# Patient Record
Sex: Female | Born: 1935 | Race: White | Hispanic: No | Smoking: Former smoker
Health system: Southern US, Community
[De-identification: ages and names within clinical notes are randomized; demographics above are authoritative.]

## PROBLEM LIST (undated history)

## (undated) DIAGNOSIS — C3490 Malignant neoplasm of unspecified part of unspecified bronchus or lung: Secondary | ICD-10-CM

## (undated) DIAGNOSIS — M810 Age-related osteoporosis without current pathological fracture: Secondary | ICD-10-CM

## (undated) DIAGNOSIS — F0390 Unspecified dementia without behavioral disturbance: Secondary | ICD-10-CM

## (undated) DIAGNOSIS — A159 Respiratory tuberculosis unspecified: Secondary | ICD-10-CM

## (undated) DIAGNOSIS — Z8669 Personal history of other diseases of the nervous system and sense organs: Secondary | ICD-10-CM

## (undated) DIAGNOSIS — Z8611 Personal history of tuberculosis: Secondary | ICD-10-CM

## (undated) DIAGNOSIS — G20A1 Parkinson's disease without dyskinesia, without mention of fluctuations: Secondary | ICD-10-CM

## (undated) DIAGNOSIS — C801 Malignant (primary) neoplasm, unspecified: Secondary | ICD-10-CM

## (undated) DIAGNOSIS — J189 Pneumonia, unspecified organism: Secondary | ICD-10-CM

## (undated) DIAGNOSIS — E119 Type 2 diabetes mellitus without complications: Secondary | ICD-10-CM

## (undated) DIAGNOSIS — E079 Disorder of thyroid, unspecified: Secondary | ICD-10-CM

## (undated) DIAGNOSIS — E785 Hyperlipidemia, unspecified: Secondary | ICD-10-CM

## (undated) DIAGNOSIS — G20 Parkinson's disease: Secondary | ICD-10-CM

## (undated) DIAGNOSIS — R42 Dizziness and giddiness: Secondary | ICD-10-CM

## (undated) DIAGNOSIS — E039 Hypothyroidism, unspecified: Secondary | ICD-10-CM

## (undated) DIAGNOSIS — K219 Gastro-esophageal reflux disease without esophagitis: Secondary | ICD-10-CM

## (undated) DIAGNOSIS — I10 Essential (primary) hypertension: Secondary | ICD-10-CM

## (undated) HISTORY — PX: CATARACT EXTRACTION: SUR2

## (undated) HISTORY — PX: THYROIDECTOMY: SHX17

## (undated) HISTORY — PX: ABDOMINAL HYSTERECTOMY: SHX81

## (undated) HISTORY — PX: CHOLECYSTECTOMY: SHX55

## (undated) HISTORY — DX: Age-related osteoporosis without current pathological fracture: M81.0

## (undated) HISTORY — DX: Disorder of thyroid, unspecified: E07.9

## (undated) HISTORY — DX: Essential (primary) hypertension: I10

## (undated) HISTORY — DX: Malignant neoplasm of unspecified part of unspecified bronchus or lung: C34.90

## (undated) HISTORY — DX: Respiratory tuberculosis unspecified: A15.9

## (undated) HISTORY — DX: Hypothyroidism, unspecified: E03.9

## (undated) HISTORY — DX: Hyperlipidemia, unspecified: E78.5

---

## 1963-03-05 DIAGNOSIS — Z8611 Personal history of tuberculosis: Secondary | ICD-10-CM

## 1963-03-05 HISTORY — DX: Personal history of tuberculosis: Z86.11

## 1992-03-04 HISTORY — PX: CHOLECYSTECTOMY: SHX55

## 2000-08-04 ENCOUNTER — Encounter: Payer: Self-pay | Admitting: Family Medicine

## 2000-08-04 ENCOUNTER — Ambulatory Visit (HOSPITAL_COMMUNITY): Admission: RE | Admit: 2000-08-04 | Discharge: 2000-08-04 | Payer: Self-pay | Admitting: Family Medicine

## 2000-12-24 ENCOUNTER — Ambulatory Visit (HOSPITAL_COMMUNITY): Admission: RE | Admit: 2000-12-24 | Discharge: 2000-12-24 | Payer: Self-pay | Admitting: Family Medicine

## 2000-12-24 ENCOUNTER — Encounter: Payer: Self-pay | Admitting: Family Medicine

## 2001-01-21 ENCOUNTER — Ambulatory Visit (HOSPITAL_COMMUNITY): Admission: RE | Admit: 2001-01-21 | Discharge: 2001-01-21 | Payer: Self-pay | Admitting: General Surgery

## 2001-05-04 ENCOUNTER — Ambulatory Visit (HOSPITAL_COMMUNITY): Admission: RE | Admit: 2001-05-04 | Discharge: 2001-05-04 | Payer: Self-pay | Admitting: Internal Medicine

## 2001-05-04 ENCOUNTER — Encounter: Payer: Self-pay | Admitting: Internal Medicine

## 2001-05-14 ENCOUNTER — Ambulatory Visit (HOSPITAL_COMMUNITY): Admission: RE | Admit: 2001-05-14 | Discharge: 2001-05-14 | Payer: Self-pay | Admitting: Internal Medicine

## 2001-05-14 ENCOUNTER — Encounter: Payer: Self-pay | Admitting: Internal Medicine

## 2001-08-05 ENCOUNTER — Encounter: Payer: Self-pay | Admitting: Family Medicine

## 2001-08-05 ENCOUNTER — Ambulatory Visit (HOSPITAL_COMMUNITY): Admission: RE | Admit: 2001-08-05 | Discharge: 2001-08-05 | Payer: Self-pay | Admitting: Family Medicine

## 2002-08-06 ENCOUNTER — Encounter: Payer: Self-pay | Admitting: Family Medicine

## 2002-08-06 ENCOUNTER — Ambulatory Visit (HOSPITAL_COMMUNITY): Admission: RE | Admit: 2002-08-06 | Discharge: 2002-08-06 | Payer: Self-pay | Admitting: Family Medicine

## 2003-03-05 HISTORY — PX: ABDOMINAL HYSTERECTOMY: SHX81

## 2003-05-06 ENCOUNTER — Ambulatory Visit (HOSPITAL_COMMUNITY): Admission: RE | Admit: 2003-05-06 | Discharge: 2003-05-06 | Payer: Self-pay | Admitting: Family Medicine

## 2003-08-08 ENCOUNTER — Ambulatory Visit (HOSPITAL_COMMUNITY): Admission: RE | Admit: 2003-08-08 | Discharge: 2003-08-08 | Payer: Self-pay | Admitting: Family Medicine

## 2003-08-15 ENCOUNTER — Other Ambulatory Visit: Admission: RE | Admit: 2003-08-15 | Discharge: 2003-08-15 | Payer: Self-pay | Admitting: Obstetrics and Gynecology

## 2003-09-08 ENCOUNTER — Ambulatory Visit (HOSPITAL_COMMUNITY): Admission: RE | Admit: 2003-09-08 | Discharge: 2003-09-08 | Payer: Self-pay | Admitting: Obstetrics and Gynecology

## 2003-09-14 ENCOUNTER — Ambulatory Visit: Admission: RE | Admit: 2003-09-14 | Discharge: 2003-09-14 | Payer: Self-pay | Admitting: Gynecology

## 2003-10-04 ENCOUNTER — Ambulatory Visit: Admission: RE | Admit: 2003-10-04 | Discharge: 2003-10-04 | Payer: Self-pay | Admitting: Gynecology

## 2003-11-01 ENCOUNTER — Encounter (INDEPENDENT_AMBULATORY_CARE_PROVIDER_SITE_OTHER): Payer: Self-pay | Admitting: *Deleted

## 2003-11-01 ENCOUNTER — Inpatient Hospital Stay (HOSPITAL_COMMUNITY): Admission: RE | Admit: 2003-11-01 | Discharge: 2003-11-03 | Payer: Self-pay | Admitting: Obstetrics & Gynecology

## 2003-11-22 ENCOUNTER — Ambulatory Visit: Admission: RE | Admit: 2003-11-22 | Discharge: 2003-11-22 | Payer: Self-pay | Admitting: Gynecology

## 2003-11-25 ENCOUNTER — Ambulatory Visit: Admission: RE | Admit: 2003-11-25 | Discharge: 2004-02-23 | Payer: Self-pay | Admitting: Radiation Oncology

## 2003-12-20 ENCOUNTER — Ambulatory Visit: Admission: RE | Admit: 2003-12-20 | Discharge: 2003-12-20 | Payer: Self-pay | Admitting: Gynecology

## 2004-05-15 ENCOUNTER — Other Ambulatory Visit: Admission: RE | Admit: 2004-05-15 | Discharge: 2004-05-15 | Payer: Self-pay | Admitting: Gynecology

## 2004-05-15 ENCOUNTER — Ambulatory Visit: Admission: RE | Admit: 2004-05-15 | Discharge: 2004-05-15 | Payer: Self-pay | Admitting: Gynecologic Oncology

## 2004-05-15 ENCOUNTER — Encounter (INDEPENDENT_AMBULATORY_CARE_PROVIDER_SITE_OTHER): Payer: Self-pay | Admitting: *Deleted

## 2004-08-10 ENCOUNTER — Ambulatory Visit (HOSPITAL_COMMUNITY): Admission: RE | Admit: 2004-08-10 | Discharge: 2004-08-10 | Payer: Self-pay | Admitting: Family Medicine

## 2004-09-25 ENCOUNTER — Ambulatory Visit (HOSPITAL_COMMUNITY): Admission: RE | Admit: 2004-09-25 | Discharge: 2004-09-25 | Payer: Self-pay | Admitting: Family Medicine

## 2004-11-09 ENCOUNTER — Ambulatory Visit (HOSPITAL_COMMUNITY): Admission: RE | Admit: 2004-11-09 | Discharge: 2004-11-09 | Payer: Self-pay | Admitting: Family Medicine

## 2004-12-12 ENCOUNTER — Inpatient Hospital Stay (HOSPITAL_COMMUNITY): Admission: AD | Admit: 2004-12-12 | Discharge: 2004-12-16 | Payer: Self-pay | Admitting: Family Medicine

## 2005-01-04 ENCOUNTER — Inpatient Hospital Stay (HOSPITAL_COMMUNITY): Admission: RE | Admit: 2005-01-04 | Discharge: 2005-01-07 | Payer: Self-pay | Admitting: General Surgery

## 2005-01-04 ENCOUNTER — Encounter (INDEPENDENT_AMBULATORY_CARE_PROVIDER_SITE_OTHER): Payer: Self-pay | Admitting: General Surgery

## 2005-02-01 ENCOUNTER — Encounter (INDEPENDENT_AMBULATORY_CARE_PROVIDER_SITE_OTHER): Payer: Self-pay | Admitting: *Deleted

## 2005-02-01 ENCOUNTER — Other Ambulatory Visit: Admission: RE | Admit: 2005-02-01 | Discharge: 2005-02-01 | Payer: Self-pay | Admitting: Gynecology

## 2005-02-01 ENCOUNTER — Ambulatory Visit: Admission: RE | Admit: 2005-02-01 | Discharge: 2005-02-01 | Payer: Self-pay | Admitting: Gynecology

## 2005-02-19 ENCOUNTER — Inpatient Hospital Stay: Payer: Self-pay | Admitting: Endocrinology

## 2005-03-04 HISTORY — PX: THYROIDECTOMY: SHX17

## 2005-04-22 ENCOUNTER — Ambulatory Visit (HOSPITAL_COMMUNITY): Admission: RE | Admit: 2005-04-22 | Discharge: 2005-04-22 | Payer: Self-pay | Admitting: Family Medicine

## 2005-06-01 ENCOUNTER — Inpatient Hospital Stay (HOSPITAL_COMMUNITY): Admission: EM | Admit: 2005-06-01 | Discharge: 2005-06-04 | Payer: Self-pay | Admitting: Emergency Medicine

## 2005-06-17 ENCOUNTER — Ambulatory Visit (HOSPITAL_COMMUNITY): Admission: RE | Admit: 2005-06-17 | Discharge: 2005-06-17 | Payer: Self-pay | Admitting: Family Medicine

## 2005-07-05 ENCOUNTER — Encounter (HOSPITAL_COMMUNITY): Admission: RE | Admit: 2005-07-05 | Discharge: 2005-08-04 | Payer: Self-pay | Admitting: Family Medicine

## 2005-07-10 ENCOUNTER — Ambulatory Visit (HOSPITAL_COMMUNITY): Admission: RE | Admit: 2005-07-10 | Discharge: 2005-07-10 | Payer: Self-pay | Admitting: Family Medicine

## 2005-08-12 ENCOUNTER — Ambulatory Visit (HOSPITAL_COMMUNITY): Admission: RE | Admit: 2005-08-12 | Discharge: 2005-08-12 | Payer: Self-pay | Admitting: Family Medicine

## 2005-09-20 ENCOUNTER — Inpatient Hospital Stay (HOSPITAL_COMMUNITY): Admission: EM | Admit: 2005-09-20 | Discharge: 2005-09-24 | Payer: Self-pay | Admitting: Emergency Medicine

## 2005-10-16 ENCOUNTER — Ambulatory Visit: Admission: RE | Admit: 2005-10-16 | Discharge: 2005-10-16 | Payer: Self-pay | Admitting: Gynecologic Oncology

## 2005-10-16 ENCOUNTER — Other Ambulatory Visit: Admission: RE | Admit: 2005-10-16 | Discharge: 2005-10-16 | Payer: Self-pay | Admitting: Gynecologic Oncology

## 2005-10-16 ENCOUNTER — Encounter (INDEPENDENT_AMBULATORY_CARE_PROVIDER_SITE_OTHER): Payer: Self-pay | Admitting: Specialist

## 2005-11-22 ENCOUNTER — Ambulatory Visit (HOSPITAL_COMMUNITY): Admission: RE | Admit: 2005-11-22 | Discharge: 2005-11-22 | Payer: Self-pay | Admitting: Radiation Oncology

## 2006-02-13 ENCOUNTER — Inpatient Hospital Stay (HOSPITAL_COMMUNITY): Admission: EM | Admit: 2006-02-13 | Discharge: 2006-02-17 | Payer: Self-pay | Admitting: Emergency Medicine

## 2006-02-13 ENCOUNTER — Ambulatory Visit: Payer: Self-pay | Admitting: Gastroenterology

## 2006-03-04 DIAGNOSIS — E079 Disorder of thyroid, unspecified: Secondary | ICD-10-CM

## 2006-03-04 HISTORY — PX: FLEXIBLE SIGMOIDOSCOPY: SHX1649

## 2006-03-04 HISTORY — DX: Disorder of thyroid, unspecified: E07.9

## 2006-04-09 ENCOUNTER — Ambulatory Visit: Payer: Self-pay | Admitting: Gastroenterology

## 2006-04-12 ENCOUNTER — Inpatient Hospital Stay (HOSPITAL_COMMUNITY): Admission: EM | Admit: 2006-04-12 | Discharge: 2006-04-15 | Payer: Self-pay | Admitting: Emergency Medicine

## 2006-04-23 ENCOUNTER — Ambulatory Visit (HOSPITAL_COMMUNITY): Admission: RE | Admit: 2006-04-23 | Discharge: 2006-04-23 | Payer: Self-pay | Admitting: Gastroenterology

## 2006-04-23 ENCOUNTER — Ambulatory Visit: Payer: Self-pay | Admitting: Gastroenterology

## 2006-05-14 ENCOUNTER — Emergency Department (HOSPITAL_COMMUNITY): Admission: EM | Admit: 2006-05-14 | Discharge: 2006-05-14 | Payer: Self-pay | Admitting: Emergency Medicine

## 2006-08-18 ENCOUNTER — Ambulatory Visit (HOSPITAL_COMMUNITY): Admission: RE | Admit: 2006-08-18 | Discharge: 2006-08-18 | Payer: Self-pay | Admitting: Internal Medicine

## 2007-03-17 ENCOUNTER — Inpatient Hospital Stay: Payer: Self-pay | Admitting: Endocrinology

## 2007-08-19 ENCOUNTER — Ambulatory Visit (HOSPITAL_COMMUNITY): Admission: RE | Admit: 2007-08-19 | Discharge: 2007-08-19 | Payer: Self-pay | Admitting: Internal Medicine

## 2007-09-16 ENCOUNTER — Encounter: Payer: Self-pay | Admitting: Gynecology

## 2007-09-16 ENCOUNTER — Other Ambulatory Visit: Admission: RE | Admit: 2007-09-16 | Discharge: 2007-09-16 | Payer: Self-pay | Admitting: Gynecology

## 2007-09-16 ENCOUNTER — Ambulatory Visit: Admission: RE | Admit: 2007-09-16 | Discharge: 2007-09-16 | Payer: Self-pay | Admitting: Gynecology

## 2007-10-22 ENCOUNTER — Encounter (INDEPENDENT_AMBULATORY_CARE_PROVIDER_SITE_OTHER): Payer: Self-pay | Admitting: Family Medicine

## 2007-11-30 ENCOUNTER — Encounter (HOSPITAL_COMMUNITY): Admission: RE | Admit: 2007-11-30 | Discharge: 2007-12-30 | Payer: Self-pay | Admitting: Endocrinology

## 2007-12-09 ENCOUNTER — Ambulatory Visit: Payer: Self-pay | Admitting: Family Medicine

## 2007-12-09 DIAGNOSIS — Z8541 Personal history of malignant neoplasm of cervix uteri: Secondary | ICD-10-CM

## 2007-12-09 DIAGNOSIS — E785 Hyperlipidemia, unspecified: Secondary | ICD-10-CM | POA: Insufficient documentation

## 2007-12-09 DIAGNOSIS — R809 Proteinuria, unspecified: Secondary | ICD-10-CM | POA: Insufficient documentation

## 2007-12-09 DIAGNOSIS — H269 Unspecified cataract: Secondary | ICD-10-CM

## 2007-12-09 DIAGNOSIS — Z85850 Personal history of malignant neoplasm of thyroid: Secondary | ICD-10-CM

## 2007-12-09 DIAGNOSIS — N390 Urinary tract infection, site not specified: Secondary | ICD-10-CM

## 2007-12-09 DIAGNOSIS — Z8611 Personal history of tuberculosis: Secondary | ICD-10-CM

## 2007-12-09 DIAGNOSIS — E119 Type 2 diabetes mellitus without complications: Secondary | ICD-10-CM

## 2007-12-09 DIAGNOSIS — I10 Essential (primary) hypertension: Secondary | ICD-10-CM

## 2007-12-09 LAB — CONVERTED CEMR LAB
Glucose, Urine, Semiquant: NEGATIVE
Hgb A1c MFr Bld: 6.8 %
LDL Goal: 100 mg/dL
Specific Gravity, Urine: 1.015
Urobilinogen, UA: 0.2

## 2008-01-05 ENCOUNTER — Encounter (INDEPENDENT_AMBULATORY_CARE_PROVIDER_SITE_OTHER): Payer: Self-pay | Admitting: Family Medicine

## 2008-01-08 ENCOUNTER — Ambulatory Visit: Payer: Self-pay | Admitting: Family Medicine

## 2008-01-08 DIAGNOSIS — M810 Age-related osteoporosis without current pathological fracture: Secondary | ICD-10-CM | POA: Insufficient documentation

## 2008-01-11 ENCOUNTER — Telehealth (INDEPENDENT_AMBULATORY_CARE_PROVIDER_SITE_OTHER): Payer: Self-pay | Admitting: Family Medicine

## 2008-01-14 ENCOUNTER — Ambulatory Visit (HOSPITAL_COMMUNITY): Admission: RE | Admit: 2008-01-14 | Discharge: 2008-01-14 | Payer: Self-pay | Admitting: Family Medicine

## 2008-01-18 ENCOUNTER — Encounter (INDEPENDENT_AMBULATORY_CARE_PROVIDER_SITE_OTHER): Payer: Self-pay | Admitting: *Deleted

## 2008-01-26 ENCOUNTER — Ambulatory Visit: Payer: Self-pay | Admitting: Family Medicine

## 2008-03-04 HISTORY — PX: HIP ARTHROPLASTY: SHX981

## 2008-03-11 ENCOUNTER — Ambulatory Visit: Payer: Self-pay | Admitting: Family Medicine

## 2008-03-11 LAB — CONVERTED CEMR LAB: Hgb A1c MFr Bld: 6.6 %

## 2008-03-14 ENCOUNTER — Encounter (INDEPENDENT_AMBULATORY_CARE_PROVIDER_SITE_OTHER): Payer: Self-pay | Admitting: Family Medicine

## 2008-03-15 ENCOUNTER — Other Ambulatory Visit: Admission: RE | Admit: 2008-03-15 | Discharge: 2008-03-15 | Payer: Self-pay | Admitting: Gynecology

## 2008-03-15 ENCOUNTER — Encounter: Payer: Self-pay | Admitting: Gynecology

## 2008-03-15 ENCOUNTER — Ambulatory Visit: Admission: RE | Admit: 2008-03-15 | Discharge: 2008-03-15 | Payer: Self-pay | Admitting: Gynecology

## 2008-03-18 LAB — CONVERTED CEMR LAB
ALT: 18 U/L
AST: 19 U/L
Albumin: 4.2 g/dL
Alkaline Phosphatase: 84 U/L
BUN: 20 mg/dL
CO2: 22 meq/L
Calcium: 9.4 mg/dL
Chloride: 110 meq/L
Cholesterol: 148 mg/dL
Creatinine, Ser: 1.15 mg/dL
Creatinine, Urine: 136.4 mg/dL
Glucose, Bld: 116 mg/dL — ABNORMAL HIGH
HDL: 52 mg/dL
LDL Cholesterol: 76 mg/dL
Microalb Creat Ratio: 21.9 mg/g
Microalb, Ur: 2.99 mg/dL — ABNORMAL HIGH
Potassium: 5.2 meq/L
Sodium: 145 meq/L
Total Bilirubin: 0.3 mg/dL
Total CHOL/HDL Ratio: 2.8
Total Protein: 7 g/dL
Triglycerides: 101 mg/dL
VLDL: 20 mg/dL

## 2008-03-29 ENCOUNTER — Encounter (INDEPENDENT_AMBULATORY_CARE_PROVIDER_SITE_OTHER): Payer: Self-pay | Admitting: Family Medicine

## 2008-04-05 ENCOUNTER — Ambulatory Visit (HOSPITAL_COMMUNITY): Admission: RE | Admit: 2008-04-05 | Discharge: 2008-04-05 | Payer: Self-pay | Admitting: Family Medicine

## 2008-04-05 ENCOUNTER — Ambulatory Visit: Payer: Self-pay | Admitting: Family Medicine

## 2008-04-12 ENCOUNTER — Ambulatory Visit (HOSPITAL_COMMUNITY): Admission: RE | Admit: 2008-04-12 | Discharge: 2008-04-12 | Payer: Self-pay | Admitting: Ophthalmology

## 2008-04-19 ENCOUNTER — Ambulatory Visit: Payer: Self-pay | Admitting: Family Medicine

## 2008-04-26 ENCOUNTER — Encounter (INDEPENDENT_AMBULATORY_CARE_PROVIDER_SITE_OTHER): Payer: Self-pay | Admitting: Family Medicine

## 2008-05-24 ENCOUNTER — Ambulatory Visit (HOSPITAL_COMMUNITY): Admission: RE | Admit: 2008-05-24 | Discharge: 2008-05-24 | Payer: Self-pay | Admitting: Ophthalmology

## 2008-06-02 ENCOUNTER — Ambulatory Visit: Payer: Self-pay | Admitting: Family Medicine

## 2008-06-02 LAB — CONVERTED CEMR LAB: Hgb A1c MFr Bld: 6.6 %

## 2008-06-07 ENCOUNTER — Telehealth (INDEPENDENT_AMBULATORY_CARE_PROVIDER_SITE_OTHER): Payer: Self-pay | Admitting: Family Medicine

## 2008-08-23 ENCOUNTER — Ambulatory Visit (HOSPITAL_COMMUNITY): Admission: RE | Admit: 2008-08-23 | Discharge: 2008-08-23 | Payer: Self-pay | Admitting: Family Medicine

## 2008-09-01 ENCOUNTER — Ambulatory Visit: Payer: Self-pay | Admitting: Family Medicine

## 2008-09-01 LAB — CONVERTED CEMR LAB: Hgb A1c MFr Bld: 6.8 %

## 2008-09-06 ENCOUNTER — Encounter (INDEPENDENT_AMBULATORY_CARE_PROVIDER_SITE_OTHER): Payer: Self-pay | Admitting: Family Medicine

## 2008-09-14 ENCOUNTER — Telehealth (INDEPENDENT_AMBULATORY_CARE_PROVIDER_SITE_OTHER): Payer: Self-pay | Admitting: Family Medicine

## 2008-09-15 ENCOUNTER — Encounter (INDEPENDENT_AMBULATORY_CARE_PROVIDER_SITE_OTHER): Payer: Self-pay | Admitting: Family Medicine

## 2008-09-21 LAB — CONVERTED CEMR LAB
Alkaline Phosphatase: 75 units/L (ref 39–117)
BUN: 26 mg/dL — ABNORMAL HIGH (ref 6–23)
CO2: 23 meq/L (ref 19–32)
Creatinine, Ser: 1.32 mg/dL — ABNORMAL HIGH (ref 0.40–1.20)
Glucose, Bld: 111 mg/dL — ABNORMAL HIGH (ref 70–99)
Sodium: 145 meq/L (ref 135–145)
Total Bilirubin: 0.4 mg/dL (ref 0.3–1.2)
Total Protein: 6.6 g/dL (ref 6.0–8.3)

## 2008-09-26 LAB — CONVERTED CEMR LAB: Potassium: 5 meq/L (ref 3.5–5.3)

## 2008-12-13 ENCOUNTER — Ambulatory Visit: Payer: Self-pay | Admitting: Endocrinology

## 2009-01-02 ENCOUNTER — Ambulatory Visit (HOSPITAL_COMMUNITY): Admission: RE | Admit: 2009-01-02 | Discharge: 2009-01-02 | Payer: Self-pay | Admitting: Internal Medicine

## 2009-01-20 ENCOUNTER — Encounter: Payer: Self-pay | Admitting: Emergency Medicine

## 2009-01-21 ENCOUNTER — Inpatient Hospital Stay (HOSPITAL_COMMUNITY): Admission: EM | Admit: 2009-01-21 | Discharge: 2009-01-25 | Payer: Self-pay | Admitting: Internal Medicine

## 2009-01-23 ENCOUNTER — Encounter (INDEPENDENT_AMBULATORY_CARE_PROVIDER_SITE_OTHER): Payer: Self-pay | Admitting: Internal Medicine

## 2009-01-25 ENCOUNTER — Inpatient Hospital Stay: Admission: AD | Admit: 2009-01-25 | Discharge: 2009-02-16 | Payer: Self-pay | Admitting: Internal Medicine

## 2009-04-27 ENCOUNTER — Other Ambulatory Visit: Admission: RE | Admit: 2009-04-27 | Discharge: 2009-04-27 | Payer: Self-pay | Admitting: Gynecologic Oncology

## 2009-04-27 ENCOUNTER — Ambulatory Visit: Admission: RE | Admit: 2009-04-27 | Discharge: 2009-04-27 | Payer: Self-pay | Admitting: Gynecologic Oncology

## 2009-08-24 ENCOUNTER — Ambulatory Visit (HOSPITAL_COMMUNITY): Admission: RE | Admit: 2009-08-24 | Discharge: 2009-08-24 | Payer: Self-pay | Admitting: Internal Medicine

## 2009-10-07 ENCOUNTER — Inpatient Hospital Stay (HOSPITAL_COMMUNITY): Admission: EM | Admit: 2009-10-07 | Discharge: 2009-10-15 | Payer: Self-pay | Admitting: Emergency Medicine

## 2010-01-12 ENCOUNTER — Ambulatory Visit (HOSPITAL_COMMUNITY)
Admission: RE | Admit: 2010-01-12 | Discharge: 2010-01-12 | Payer: Self-pay | Source: Home / Self Care | Admitting: Internal Medicine

## 2010-03-26 ENCOUNTER — Ambulatory Visit (HOSPITAL_COMMUNITY)
Admission: RE | Admit: 2010-03-26 | Discharge: 2010-03-26 | Payer: Self-pay | Source: Home / Self Care | Attending: Internal Medicine | Admitting: Internal Medicine

## 2010-04-01 LAB — CONVERTED CEMR LAB
Albumin: 3.8 g/dL (ref 3.5–5.2)
Blood in Urine, dipstick: NEGATIVE
CO2: 22 meq/L (ref 19–32)
Calcium: 9.3 mg/dL (ref 8.4–10.5)
Creatinine, Ser: 1.5 mg/dL — ABNORMAL HIGH (ref 0.40–1.20)
Eosinophils Relative: 1 % (ref 0–5)
Glucose, Bld: 128 mg/dL — ABNORMAL HIGH (ref 70–99)
HCT: 33.9 % — ABNORMAL LOW (ref 36.0–46.0)
Hemoglobin: 11.6 g/dL — ABNORMAL LOW (ref 12.0–15.0)
Lymphocytes Relative: 26 % (ref 12–46)
MCHC: 34.2 g/dL (ref 30.0–36.0)
Monocytes Absolute: 0.6 10*3/uL (ref 0.1–1.0)
Neutro Abs: 5.8 10*3/uL (ref 1.7–7.7)
Neutrophils Relative %: 66 % (ref 43–77)
Platelets: 302 10*3/uL (ref 150–400)
Potassium: 5.1 meq/L (ref 3.5–5.3)
Protein, U semiquant: 30
RBC: 3.74 M/uL — ABNORMAL LOW (ref 3.87–5.11)
Total Bilirubin: 0.4 mg/dL (ref 0.3–1.2)
Urobilinogen, UA: 0.2
WBC Urine, dipstick: NEGATIVE
WBC: 8.8 10*3/uL (ref 4.0–10.5)
pH: 5.5

## 2010-04-12 ENCOUNTER — Ambulatory Visit (HOSPITAL_COMMUNITY)
Admission: RE | Admit: 2010-04-12 | Discharge: 2010-04-12 | Disposition: A | Discharge status: Home / Self Care | Payer: Medicare Other | Source: Ambulatory Visit | Attending: Pulmonary Disease | Admitting: Pulmonary Disease

## 2010-04-12 DIAGNOSIS — R0609 Other forms of dyspnea: Secondary | ICD-10-CM | POA: Insufficient documentation

## 2010-04-12 DIAGNOSIS — R059 Cough, unspecified: Secondary | ICD-10-CM | POA: Insufficient documentation

## 2010-04-12 DIAGNOSIS — R05 Cough: Secondary | ICD-10-CM | POA: Insufficient documentation

## 2010-04-12 DIAGNOSIS — R0989 Other specified symptoms and signs involving the circulatory and respiratory systems: Secondary | ICD-10-CM | POA: Insufficient documentation

## 2010-05-18 LAB — GLUCOSE, CAPILLARY
Glucose-Capillary: 110 mg/dL — ABNORMAL HIGH (ref 70–99)
Glucose-Capillary: 110 mg/dL — ABNORMAL HIGH (ref 70–99)
Glucose-Capillary: 110 mg/dL — ABNORMAL HIGH (ref 70–99)
Glucose-Capillary: 111 mg/dL — ABNORMAL HIGH (ref 70–99)
Glucose-Capillary: 113 mg/dL — ABNORMAL HIGH (ref 70–99)
Glucose-Capillary: 115 mg/dL — ABNORMAL HIGH (ref 70–99)
Glucose-Capillary: 116 mg/dL — ABNORMAL HIGH (ref 70–99)
Glucose-Capillary: 119 mg/dL — ABNORMAL HIGH (ref 70–99)
Glucose-Capillary: 120 mg/dL — ABNORMAL HIGH (ref 70–99)
Glucose-Capillary: 120 mg/dL — ABNORMAL HIGH (ref 70–99)
Glucose-Capillary: 123 mg/dL — ABNORMAL HIGH (ref 70–99)
Glucose-Capillary: 125 mg/dL — ABNORMAL HIGH (ref 70–99)
Glucose-Capillary: 128 mg/dL — ABNORMAL HIGH (ref 70–99)
Glucose-Capillary: 140 mg/dL — ABNORMAL HIGH (ref 70–99)
Glucose-Capillary: 140 mg/dL — ABNORMAL HIGH (ref 70–99)
Glucose-Capillary: 161 mg/dL — ABNORMAL HIGH (ref 70–99)
Glucose-Capillary: 69 mg/dL — ABNORMAL LOW (ref 70–99)
Glucose-Capillary: 80 mg/dL (ref 70–99)
Glucose-Capillary: 81 mg/dL (ref 70–99)
Glucose-Capillary: 88 mg/dL (ref 70–99)
Glucose-Capillary: 92 mg/dL (ref 70–99)
Glucose-Capillary: 93 mg/dL (ref 70–99)
Glucose-Capillary: 98 mg/dL (ref 70–99)

## 2010-05-18 LAB — BASIC METABOLIC PANEL
BUN: 5 mg/dL — ABNORMAL LOW (ref 6–23)
BUN: 9 mg/dL (ref 6–23)
CO2: 22 mEq/L (ref 19–32)
CO2: 23 mEq/L (ref 19–32)
CO2: 23 mEq/L (ref 19–32)
CO2: 24 mEq/L (ref 19–32)
Calcium: 7 mg/dL — ABNORMAL LOW (ref 8.4–10.5)
Calcium: 7.4 mg/dL — ABNORMAL LOW (ref 8.4–10.5)
Calcium: 8 mg/dL — ABNORMAL LOW (ref 8.4–10.5)
Chloride: 118 mEq/L — ABNORMAL HIGH (ref 96–112)
Creatinine, Ser: 1.26 mg/dL — ABNORMAL HIGH (ref 0.4–1.2)
GFR calc Af Amer: 52 mL/min — ABNORMAL LOW (ref >60)
GFR calc non Af Amer: 41 mL/min — ABNORMAL LOW (ref >60)
GFR calc non Af Amer: 43 mL/min — ABNORMAL LOW (ref >60)
GFR calc non Af Amer: 45 mL/min — ABNORMAL LOW (ref >60)
Glucose, Bld: 101 mg/dL — ABNORMAL HIGH (ref 70–99)
Glucose, Bld: 123 mg/dL — ABNORMAL HIGH (ref 70–99)
Glucose, Bld: 129 mg/dL — ABNORMAL HIGH (ref 70–99)
Glucose, Bld: 95 mg/dL (ref 70–99)
Potassium: 3.3 mEq/L — ABNORMAL LOW (ref 3.5–5.1)
Potassium: 3.4 mEq/L — ABNORMAL LOW (ref 3.5–5.1)
Potassium: 3.5 mEq/L (ref 3.5–5.1)
Potassium: 3.5 mEq/L (ref 3.5–5.1)
Sodium: 141 mEq/L (ref 135–145)
Sodium: 144 mEq/L (ref 135–145)
Sodium: 145 mEq/L (ref 135–145)
Sodium: 146 mEq/L — ABNORMAL HIGH (ref 135–145)
Sodium: 147 mEq/L — ABNORMAL HIGH (ref 135–145)

## 2010-05-18 LAB — CBC
HCT: 25.4 % — ABNORMAL LOW (ref 36.0–46.0)
HCT: 25.9 % — ABNORMAL LOW (ref 36.0–46.0)
Hemoglobin: 10 g/dL — ABNORMAL LOW (ref 12.0–15.0)
Hemoglobin: 10.5 g/dL — ABNORMAL LOW (ref 12.0–15.0)
Hemoglobin: 8.5 g/dL — ABNORMAL LOW (ref 12.0–15.0)
Hemoglobin: 9.1 g/dL — ABNORMAL LOW (ref 12.0–15.0)
MCH: 32.1 pg (ref 26.0–34.0)
MCH: 32.2 pg (ref 26.0–34.0)
MCHC: 34 g/dL (ref 30.0–36.0)
MCHC: 34 g/dL (ref 30.0–36.0)
MCV: 95.8 fL (ref 78.0–100.0)
RBC: 2.65 MIL/uL — ABNORMAL LOW (ref 3.87–5.11)
RBC: 2.84 MIL/uL — ABNORMAL LOW (ref 3.87–5.11)
RDW: 14.3 % (ref 11.5–15.5)
RDW: 14.8 % (ref 11.5–15.5)
WBC: 11.5 10*3/uL — ABNORMAL HIGH (ref 4.0–10.5)
WBC: 6.1 10*3/uL (ref 4.0–10.5)
WBC: 6.9 10*3/uL (ref 4.0–10.5)

## 2010-05-18 LAB — DIFFERENTIAL
Basophils Absolute: 0 10*3/uL (ref 0.0–0.1)
Basophils Absolute: 0 10*3/uL (ref 0.0–0.1)
Basophils Relative: 0 % (ref 0–1)
Eosinophils Absolute: 0 10*3/uL (ref 0.0–0.7)
Eosinophils Absolute: 0.2 10*3/uL (ref 0.0–0.7)
Lymphocytes Relative: 16 % (ref 12–46)
Lymphocytes Relative: 25 % (ref 12–46)
Lymphocytes Relative: 9 % — ABNORMAL LOW (ref 12–46)
Lymphs Abs: 1 10*3/uL (ref 0.7–4.0)
Lymphs Abs: 1.1 10*3/uL (ref 0.7–4.0)
Lymphs Abs: 1.5 10*3/uL (ref 0.7–4.0)
Monocytes Absolute: 0.1 10*3/uL (ref 0.1–1.0)
Monocytes Absolute: 0.5 10*3/uL (ref 0.1–1.0)
Monocytes Absolute: 0.7 10*3/uL (ref 0.1–1.0)
Monocytes Relative: 1 % — ABNORMAL LOW (ref 3–12)
Monocytes Relative: 6 % (ref 3–12)
Monocytes Relative: 7 % (ref 3–12)
Monocytes Relative: 8 % (ref 3–12)
Neutro Abs: 10.4 10*3/uL — ABNORMAL HIGH (ref 1.7–7.7)
Neutro Abs: 3.8 10*3/uL (ref 1.7–7.7)
Neutro Abs: 5.1 10*3/uL (ref 1.7–7.7)
Neutrophils Relative %: 62 % (ref 43–77)
Neutrophils Relative %: 64 % (ref 43–77)
Neutrophils Relative %: 72 % (ref 43–77)
Neutrophils Relative %: 74 % (ref 43–77)

## 2010-05-18 LAB — COMPREHENSIVE METABOLIC PANEL
ALT: 13 U/L (ref 0–35)
AST: 20 U/L (ref 0–37)
Albumin: 3 g/dL — ABNORMAL LOW (ref 3.5–5.2)
Albumin: 3.8 g/dL (ref 3.5–5.2)
BUN: 14 mg/dL (ref 6–23)
BUN: 22 mg/dL (ref 6–23)
CO2: 23 mEq/L (ref 19–32)
CO2: 23 mEq/L (ref 19–32)
Calcium: 7.1 mg/dL — ABNORMAL LOW (ref 8.4–10.5)
Calcium: 7.2 mg/dL — ABNORMAL LOW (ref 8.4–10.5)
Calcium: 9.2 mg/dL (ref 8.4–10.5)
Chloride: 118 mEq/L — ABNORMAL HIGH (ref 96–112)
Creatinine, Ser: 1.25 mg/dL — ABNORMAL HIGH (ref 0.4–1.2)
Creatinine, Ser: 1.76 mg/dL — ABNORMAL HIGH (ref 0.4–1.2)
GFR calc Af Amer: 34 mL/min — ABNORMAL LOW (ref >60)
GFR calc Af Amer: 50 mL/min — ABNORMAL LOW (ref >60)
GFR calc non Af Amer: 41 mL/min — ABNORMAL LOW (ref >60)
GFR calc non Af Amer: 42 mL/min — ABNORMAL LOW (ref >60)
Glucose, Bld: 128 mg/dL — ABNORMAL HIGH (ref 70–99)
Sodium: 145 mEq/L (ref 135–145)
Total Protein: 7.2 g/dL (ref 6.0–8.3)

## 2010-05-18 LAB — TYPE AND SCREEN
ABO/RH(D): B POS
Antibody Screen: NEGATIVE

## 2010-05-18 LAB — FOLATE: Folate: >20 ng/mL

## 2010-05-18 LAB — URINALYSIS, ROUTINE W REFLEX MICROSCOPIC
Ketones, ur: NEGATIVE mg/dL
Nitrite: NEGATIVE
Protein, ur: NEGATIVE mg/dL
pH: 5.5 (ref 5.0–8.0)

## 2010-05-18 LAB — HEMOGLOBIN A1C: Mean Plasma Glucose: 143 mg/dL — ABNORMAL HIGH (ref <117)

## 2010-05-18 LAB — VITAMIN B12: Vitamin B-12: 323 pg/mL (ref 211–911)

## 2010-05-18 LAB — MAGNESIUM: Magnesium: 2.3 mg/dL (ref 1.5–2.5)

## 2010-05-18 LAB — IRON AND TIBC
Saturation Ratios: 7 % — ABNORMAL LOW (ref 20–55)
UIBC: 199 ug/dL

## 2010-05-18 LAB — FERRITIN: Ferritin: 105 ng/mL (ref 10–291)

## 2010-05-18 LAB — RETICULOCYTES: Retic Ct Pct: 2.9 % (ref 0.4–3.1)

## 2010-06-05 LAB — GLUCOSE, CAPILLARY
Glucose-Capillary: 120 mg/dL — ABNORMAL HIGH (ref 70–99)
Glucose-Capillary: 120 mg/dL — ABNORMAL HIGH (ref 70–99)
Glucose-Capillary: 85 mg/dL (ref 70–99)
Glucose-Capillary: 89 mg/dL (ref 70–99)
Glucose-Capillary: 90 mg/dL (ref 70–99)
Glucose-Capillary: 92 mg/dL (ref 70–99)
Glucose-Capillary: 93 mg/dL (ref 70–99)

## 2010-06-06 LAB — CBC
Hemoglobin: 11.4 g/dL — ABNORMAL LOW (ref 12.0–15.0)
Hemoglobin: 8.4 g/dL — ABNORMAL LOW (ref 12.0–15.0)
MCHC: 34 g/dL (ref 30.0–36.0)
MCHC: 32.8 g/dL (ref 30.0–36.0)
MCHC: 33 g/dL (ref 30.0–36.0)
MCHC: 33.3 g/dL (ref 30.0–36.0)
Platelets: 208 10*3/uL (ref 150–400)
RBC: 3.19 MIL/uL — ABNORMAL LOW (ref 3.87–5.11)
RDW: 15.1 % (ref 11.5–15.5)
RDW: 15.7 % — ABNORMAL HIGH (ref 11.5–15.5)
RDW: 18 % — ABNORMAL HIGH (ref 11.5–15.5)
WBC: 10.4 10*3/uL (ref 4.0–10.5)

## 2010-06-06 LAB — URINALYSIS, MICROSCOPIC ONLY
Glucose, UA: NEGATIVE mg/dL
Protein, ur: NEGATIVE mg/dL
pH: 7 (ref 5.0–8.0)

## 2010-06-06 LAB — GLUCOSE, CAPILLARY
Glucose-Capillary: 92 mg/dL (ref 70–99)
Glucose-Capillary: 95 mg/dL (ref 70–99)
Glucose-Capillary: 96 mg/dL (ref 70–99)
Glucose-Capillary: 102 mg/dL — ABNORMAL HIGH (ref 70–99)
Glucose-Capillary: 105 mg/dL — ABNORMAL HIGH (ref 70–99)
Glucose-Capillary: 110 mg/dL — ABNORMAL HIGH (ref 70–99)
Glucose-Capillary: 118 mg/dL — ABNORMAL HIGH (ref 70–99)
Glucose-Capillary: 118 mg/dL — ABNORMAL HIGH (ref 70–99)
Glucose-Capillary: 119 mg/dL — ABNORMAL HIGH (ref 70–99)
Glucose-Capillary: 171 mg/dL — ABNORMAL HIGH (ref 70–99)
Glucose-Capillary: 86 mg/dL (ref 70–99)
Glucose-Capillary: 88 mg/dL (ref 70–99)
Glucose-Capillary: 90 mg/dL (ref 70–99)
Glucose-Capillary: 96 mg/dL (ref 70–99)
Glucose-Capillary: 99 mg/dL (ref 70–99)

## 2010-06-06 LAB — CARDIAC PANEL(CRET KIN+CKTOT+MB+TROPI)
Relative Index: 1.3 (ref 0.0–2.5)
Relative Index: INVALID (ref 0.0–2.5)
Total CK: 203 U/L — ABNORMAL HIGH (ref 7–177)
Troponin I: 0.01 ng/mL (ref 0.00–0.06)
Troponin I: 0.02 ng/mL (ref 0.00–0.06)
Troponin I: 0.03 ng/mL (ref 0.00–0.06)

## 2010-06-06 LAB — COMPREHENSIVE METABOLIC PANEL
ALT: 20 U/L (ref 0–35)
AST: 20 U/L (ref 0–37)
Alkaline Phosphatase: 99 U/L (ref 39–117)
CO2: 25 mEq/L (ref 19–32)
Calcium: 9.1 mg/dL (ref 8.4–10.5)
GFR calc Af Amer: 41 mL/min — ABNORMAL LOW (ref >60)
GFR calc non Af Amer: 34 mL/min — ABNORMAL LOW (ref >60)
Potassium: 4.4 mEq/L (ref 3.5–5.1)
Sodium: 143 mEq/L (ref 135–145)

## 2010-06-06 LAB — FOLATE: Folate: >20 ng/mL

## 2010-06-06 LAB — BASIC METABOLIC PANEL
BUN: 26 mg/dL — ABNORMAL HIGH (ref 6–23)
CO2: 23 mEq/L (ref 19–32)
Calcium: 7.8 mg/dL — ABNORMAL LOW (ref 8.4–10.5)
Calcium: 7.8 mg/dL — ABNORMAL LOW (ref 8.4–10.5)
Creatinine, Ser: 1.17 mg/dL (ref 0.4–1.2)
Creatinine, Ser: 1.18 mg/dL (ref 0.4–1.2)
GFR calc Af Amer: 54 mL/min — ABNORMAL LOW (ref >60)
GFR calc Af Amer: 55 mL/min — ABNORMAL LOW (ref >60)
GFR calc non Af Amer: 45 mL/min — ABNORMAL LOW (ref >60)
Glucose, Bld: 102 mg/dL — ABNORMAL HIGH (ref 70–99)
Glucose, Bld: 160 mg/dL — ABNORMAL HIGH (ref 70–99)
Sodium: 144 mEq/L (ref 135–145)

## 2010-06-06 LAB — TYPE AND SCREEN: Antibody Screen: NEGATIVE

## 2010-06-06 LAB — DIFFERENTIAL
Basophils Absolute: 0 10*3/uL (ref 0.0–0.1)
Basophils Relative: 0 % (ref 0–1)
Eosinophils Absolute: 0.2 10*3/uL (ref 0.0–0.7)
Eosinophils Absolute: 0 10*3/uL (ref 0.0–0.7)
Eosinophils Relative: 2 % (ref 0–5)
Lymphs Abs: 1.2 10*3/uL (ref 0.7–4.0)
Monocytes Relative: 7 % (ref 3–12)
Neutro Abs: 6.3 10*3/uL (ref 1.7–7.7)
Neutrophils Relative %: 81 % — ABNORMAL HIGH (ref 43–77)

## 2010-06-06 LAB — LIPID PANEL
HDL: 51 mg/dL (ref >39)
Triglycerides: 89 mg/dL (ref <150)
VLDL: 18 mg/dL (ref 0–40)

## 2010-06-06 LAB — URINE CULTURE

## 2010-06-06 LAB — IRON AND TIBC
Iron: 14 ug/dL — ABNORMAL LOW (ref 42–135)
Saturation Ratios: 7 % — ABNORMAL LOW (ref 20–55)
TIBC: 212 ug/dL — ABNORMAL LOW (ref 250–470)

## 2010-06-06 LAB — URINALYSIS, ROUTINE W REFLEX MICROSCOPIC
Bilirubin Urine: NEGATIVE
Specific Gravity, Urine: <1.005 — ABNORMAL LOW (ref 1.005–1.030)
pH: 6 (ref 5.0–8.0)

## 2010-06-06 LAB — HEMOGLOBIN A1C: Hgb A1c MFr Bld: 6.5 % — ABNORMAL HIGH (ref 4.6–6.1)

## 2010-06-06 LAB — URINE MICROSCOPIC-ADD ON

## 2010-06-06 LAB — HEMOGLOBIN AND HEMATOCRIT, BLOOD: HCT: 23.3 % — ABNORMAL LOW (ref 36.0–46.0)

## 2010-06-06 LAB — RETICULOCYTES: Retic Count, Absolute: 39.1 10*3/uL (ref 19.0–186.0)

## 2010-06-06 LAB — TSH: TSH: 0.058 u[IU]/mL — ABNORMAL LOW (ref 0.350–4.500)

## 2010-06-14 LAB — GLUCOSE, CAPILLARY: Glucose-Capillary: 118 mg/dL — ABNORMAL HIGH (ref 70–99)

## 2010-07-17 NOTE — Consult Note (Signed)
NAME:  Krista [name], Krista [name]              ACCOUNT NO.:  [id number]   MEDICAL RECORD NO.:  [id number]          PATIENT TYPE:  OUT   LOCATION:  GYN                          FACILITY:  [facility]   PHYSICIAN:  De Blanch, M.D.DATE OF BIRTH:  March 10, 1931   DATE OF CONSULTATION:  DATE OF DISCHARGE:                                 CONSULTATION   CHIEF COMPLAINT:  Cervical cancer, abnormal Pap smear.   The patient returns today after having seen Dr. Antony Blackbird for routine  followup on December 8.  On that visit he obtained a Pap smear which  showed atypical squamous cells.  High-grade squamous intraepithelial  lesion could not be excluded.  The patient herself reports she has had  no pelvic symptoms.  She specifically denies any vaginal discharge or  bleeding or pelvic pain.   HISTORY OF PRESENT ILLNESS:  In August 2005 the patient underwent a  radical hysterectomy and pelvic lymphadenectomy.  She had invasive  moderately differentiated squamous carcinoma which is deeply invasive to  within 1 mm of the cervical serosa as well as lower uterine segment  involvement.  She received postoperative pelvic radiation therapy and  concurrent cisplatin chemotherapy.  She has been followed since that  time with no evidence of recurrent disease.   PAST MEDICAL HISTORY:  Medical illnesses:  Hyperlipidemia, diabetes.   DRUG ALLERGIES:  SULFA.   CURRENT MEDICATIONS:  Lipitor, metformin, meclizine and baby aspirin.   PAST SURGICAL HISTORY:  Radical hysterectomy and pelvic lymphadenectomy.   SOCIAL HISTORY:  The patient is a homemaker.  She does not smoke.   FAMILY HISTORY:  She has a sister with a brain tumor, a sister with  ovarian cancer, a third sister with breast cancer and daughter who  initially presented with an advanced cervical cancer who died quite  rapidly.   REVIEW OF SYSTEMS:  10-point comprehensive review of systems negative  except as noted above.   PHYSICAL EXAM:  Weight 115-1/2  pounds.  IN GENERAL:  The patient is a healthy white female in no acute distress.  HEENT:  Is negative.  NECK:  Supple without thyromegaly.  There is no supraclavicular or  inguinal adenopathy.  ABDOMEN:  Soft, nontender.  No mass, splenomegaly, ascites or hernias  noted.  PELVIC EXAM:  EG BUS.  Vagina by urethra are normal but slightly  atrophic.  On gross inspection there are no vaginal lesions.  Bimanual  exam reveals no masses or nodularity.  There is some radiation fibrosis.  LOWER EXTREMITIES:  Without edema or varicosities.   PROCEDURE NOTE:  Colposcopy of the vagina is carried out using acetic  acid and a white light and green filter.  Beneath the urethra,  approximately midway down the anterior vagina is an area which is  somewhat raised and  slightly acetowhite.  There are no other lesions.  After applying  Hurricaine gel, represented a biopsy of this 7-mm area is obtained and  will be submitted to pathology.  In addition, prior to biopsy a Pap  smear was obtained.   IMPRESSION:  Possible vaginal dysplasia.  We will await biopsy reports  before making further recommendations.      De Blanch, M.D.  Electronically Signed     DC/MEDQ  D:  03/15/2008  T:  03/15/2008  Job:  [id number]   cc:   Telford Nab, R.N.  [address]  [place], [place] [address]   Billie Lade, M.D.  Fax: [phone]   [name], M.D.  Fax: [phone]   Annia Friendly. Loleta Chance, MD  Fax: [phone]

## 2010-07-17 NOTE — Consult Note (Signed)
NAME:  Krista [name], DRUMMER              ACCOUNT NO.:  [id number]   MEDICAL RECORD NO.:  [id number]          PATIENT TYPE:  OUT   LOCATION:  GYN                          FACILITY:  [facility]   PHYSICIAN:  De Blanch, M.D.DATE OF BIRTH:  May 15, 1931   DATE OF CONSULTATION:  09/16/2007  DATE OF DISCHARGE:                                 CONSULTATION   CHIEF COMPLAINT:  Cervix cancer.   INTERVAL HISTORY:  Since her last visit, the patient has done well.  She  denies any pelvic pain, pressure, vaginal bleeding or discharge.  She  does have occasional intermittent right lower quadrant soreness.  This  was not associated with any activity or GI or GU function.  The patient  recently saw Dr. Roselind Messier who obtained a Pap smear showing atypical  squamous cells.   HISTORY OF PRESENT ILLNESS:  The patient underwent a radical  hysterectomy and pelvic lymphadenectomy in August 2005.  She had an  invasive moderately differentiated squamous carcinoma which is deeply  invasive 1 mm cervical serosa.  She had involvement of the lower uterine  segment.  All lymph nodes were free of disease.  She received  postoperative pelvic radiation therapy as well as concurrent cisplatin  chemotherapy.   PAST MEDICAL HISTORY:   DRUG ALLERGIES:  SULFA.   CURRENT MEDICATIONS:  Lipitor, metformin, meclizine and baby aspirin.   MEDICAL ILLNESSES:  Diabetes.   HISTORY:  Tuberculosis 1965.  Migraine headaches.  Gastroesophageal  reflux disease.  Hyperlipidemia.   PAST SURGICAL HISTORY:  Radical hysterectomy.   SOCIAL HISTORY:  The patient is a homemaker.  She does not smoke   FAMILY HISTORY:  Sister with brain tumor.  A sister with ovarian cancer.  Third sister with breast cancer.   REVIEW OF SYSTEMS:  A 10-point comprehensive review of systems negative  except as noted above.   PHYSICAL EXAM:  VITAL SIGNS:  Weight 125 pounds, blood pressure 130/70,  pulse 80, respiratory rate 20.  GENERAL:  The patient is  a slender healthy white female in no acute  distress.  HEENT is negative.  NECK:  Supple without thyromegaly.  There is no supraclavicular or  inguinal adenopathy.  ABDOMEN:  Soft, nontender no masses, organomegaly, ascites or hernias  noted.  PELVIC:  Exam EG, BUS, vagina, urethra are normal.  Cervix and uterus  surgically absent.  Vagina is atrophic.  Bimanual rectovaginal exam  reveals no masses, induration or nodularity.   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  Clinically  free of disease.  Pap smears were repeated.  The patient to return to  see Dr. Roselind Messier day as previously scheduled and return to see [place] 6 months  thereafter.      De Blanch, M.D.  Electronically Signed     DC/MEDQ  D:  09/16/2007  T:  09/16/2007  Job:  [id number]   cc:   Telford Nab, R.N.  [address]  [place], [place] [address]   Billie Lade, M.D.  Fax: [phone]   [name], M.D.  Fax: [phone]   Annia Friendly. Loleta Chance, MD  Fax: [phone]

## 2010-07-18 ENCOUNTER — Other Ambulatory Visit (HOSPITAL_COMMUNITY): Payer: Self-pay | Admitting: Pulmonary Disease

## 2010-07-18 DIAGNOSIS — Z139 Encounter for screening, unspecified: Secondary | ICD-10-CM

## 2010-07-20 ENCOUNTER — Other Ambulatory Visit (HOSPITAL_COMMUNITY): Payer: Self-pay | Admitting: Pulmonary Disease

## 2010-07-20 NOTE — Consult Note (Signed)
NAME:  [name], Krista              ACCOUNT NO.:  [id number]   MEDICAL RECORD NO.:  [id number]          PATIENT TYPE:  OUT   LOCATION:  GYN                          FACILITY:  [facility]   PHYSICIAN:  De Blanch, M.D.DATE OF BIRTH:  Aug 04, 1931   DATE OF CONSULTATION:  12/20/2003  DATE OF DISCHARGE:                                   CONSULTATION   REASON FOR CONSULTATION:  This 77 year old returns for follow-up.  She is  currently receiving radiation therapy for her high risk squamous cell  carcinoma of the cervix following a radical hysterectomy, which was  performed on November 01, 2003.  She seems to be tolerating the radiation  therapy well.   Her primary issue is that of bladder function.  She has a suprapubic  catheter in.  She has been doing intermittent clamp and release but is  really not voiding and does not have any sensation to void.  After giving  this a reasonable trial, we have elected today to train her to perform  intermittent self-catheterization.  She comes accompanied by her two  daughters today.   PHYSICAL EXAMINATION:  VITAL SIGNS:  Weight 122 pounds.  Blood pressure  130/70.  ABDOMEN:  Soft and nontender.  The Pfannenstiel incision is healing well.  The suprapubic site is somewhat erythematous.   PLAN:  The suprapubic is removed without difficulty and the patient is  taught by Telford Nab how to perform intermittent self-catheterization.  We will have her catheterize herself at bed time and first thing in the  morning and  then again approximately midday.  She will catheterize more often if her  volumes in excess of 300 cc so as to avoid overdistention of the bladder.  The patient will continue her external beam radiation therapy and we will  plan on seeing her back again in approximately a month after completing  radiation therapy.     Dani   DC/MEDQ  D:  12/20/2003  T:  12/20/2003  Job:  [id number]   cc:   [name], M.D.  [address]  [place]  [place] [address]  Fax: [phone]   Telford Nab, R.N.  [address]  [place], [place] [address]   Billie Lade, M.D.  [address] [facility]  [place]  [place] [address]  Fax: [phone]

## 2010-07-20 NOTE — Discharge Summary (Signed)
NAME:  Krista [name], Krista [name]              ACCOUNT NO.:  [id number]   MEDICAL RECORD NO.:  [id number]          PATIENT TYPE:  INP   LOCATION:  A341                          FACILITY:  [facility]   PHYSICIAN:  Osvaldo Shipper, MD     DATE OF BIRTH:  08-27-1931   DATE OF ADMISSION:  09/20/2005  DATE OF DISCHARGE:  07/24/2007LH                                 DISCHARGE SUMMARY   HISTORY AND PHYSICAL:  Please review H&P dictated at the time of admission  for details regarding the patient's presenting illness.   The patient's P.M.D. used to be Dr. Loleta Chance.  However, she is going to see Dr.  Felecia Shelling starting in August because Dr. Loleta Chance closed his practice.   DISCHARGE DIAGNOSES:  1.  Recurrent cellulitis of the pubic area, resolved.  2.  Streptococcus agalactia infection/bacteremia, improved.  3.  Type 2 diabetes.  4.  History of cervical cancer, status post surgery and radiation treatment      in 2005.  5.  History of thyroid cancer, status post surgery.   BRIEF HOSPITAL COURSE:  Briefly, this is a 77 year old Caucasian female who  presented to the emergency department with fever.  She was found to have  cellulitis of her pubic and suprapubic area.  She has a history of cervical  cancer for which she underwent a radical hysterectomy back in 2005, and  underwent radiation therapy for this, as well.  The patient had similar  episode of cellulitis back in October 2006, as well as in March 2007.  The  blood cultures in March 2007, grew Strep [place].  Same organism grew  again this time around.  The patient was initially put on Vanc and Zosyn for  two days with which she improved.  Yesterday, she was switched over to  Levaquin.  The patient has remained afebrile for more than 24 hours now.  Because this was her third infection in this area and because strep grew  consistently in the blood, we discussed the case with Dr. Ninetta Lights who is an  infectious disease specialist at [facility].  After a  brief  discussion of this case, he mentioned that this patient might benefit from a  chronic prophylaxis with antibiotics.  Hence, he recommended that Levaquin  be continued for the acute infection and then the patient be started on  penicillin VK.  Hence, these recommendations will be made to the patient.   Her other medical problems that include diabetes, hypothyroidism,  dyslipidemia remained stable.   She was asked to follow up with her P.M.D. for all these issues.  On the day  of discharge, she was stable to go home.   DISCHARGE MEDICATIONS:  1.  Levaquin 500 mg t.i.d. for 10 days.  2.  Penicillin VK 250 mg b.i.d. to be started after the course of Levaquin      is over, to be continued indefinitely as a prophylactic agent.  3.  She was asked to continue her Synthroid, Metformin, and her cholesterol      medication as before.  4.  The patient is asked to continue  her aspirin, meclizine, and magnesium.   DIET:  Modified carbohydrate regular diet.   PHYSICAL ACTIVITY:  As before.   CONSULTATIONS:  Phone discussion with Dr. Ninetta Lights, infectious disease  specialist, at [facility].   IMAGING STUDIES:  None during this admission.      Osvaldo Shipper, MD  Electronically Signed     GK/MEDQ  D:  09/24/2005  T:  09/24/2005  Job:  [id number]   cc:   Lacretia Leigh. Ninetta Lights, M.D.  Fax: [phone]   Tesfaye D. Felecia Shelling, MD  Fax: [phone]

## 2010-07-20 NOTE — H&P (Signed)
NAME:  Krista [name], Krista [name]                        ACCOUNT NO.:  [id number]   MEDICAL RECORD NO.:  [id number]                   PATIENT TYPE:  AMB   LOCATION:  DAY                                  FACILITY:  [facility]   PHYSICIAN:  [name], M.D.              DATE OF BIRTH:  [birth year]   DATE OF ADMISSION:  DATE OF DISCHARGE:                                HISTORY & PHYSICAL   DATE OF SURGERY:  Patient is scheduled for procedure on September 08, 2003.   CHIEF COMPLAINT:  Cervical dysplasia, high grade with endocervical gland  involvement.  Endometrial thickening.   HISTORY OF PRESENT ILLNESS:  This 77 year old female was sent to our office  courtesy of Dr. Mirna Mires for evaluation of a friable cervix with vaginal  discharge.  The patient was seen originally in our office in April of 1999  on referral from Dr. Loleta Chance.  She had  large 3 cm endocervical polyps  protruding from the cervix at that time.  These rather huge cervical polyps  were sent off after excision and returned showing benign endocervical polyps  without tumor.  At that point in time she had a very thin endometrium, 5 mm  thickness.  She was on no hormone therapy at the time.  Subsequently there  was an interval where she was not receiving any evaluations or care at our  facility.  She returned to our office for vaginal bleeding in return  referral from Dr. Loleta Chance on August 15, 2003 where she was found to have a  smooth, fleshy friable cervical polyp at 4 o'clock that bled easily and a  fibrotic band from the cervix to the posterior vaginal fornix.  The cervical  polyp returned showing high grade dysplasia with multiple endocervical  glands involved.  She was therefore felt a candidate for cold knife  conization.  Her bimanual examination showed a rather thick-feeling cervix  and ultrasound was performed which showed a normal sized uterus with a very  thickened cervix.  The uterus measured 7.6 cm in length by 4 cm AP  thickness.   There was a hyperechoic area that raised the question of an  endometrial polyp measuring 2.2 cm in thickness.  Decision was made that  dilatation, curettage and hysteroscopy were necessary to rule out an  endometrial polyp as well.  Plans are now to proceed with hysteroscopy,  dilation and curettage followed by cold knife conization on September 08, 2003.   PAST MEDICAL HISTORY:  Positive for tuberculosis in 1965 requiring  [facility] for six months and one year of therapy.   PAST SURGICAL HISTORY:  Laparoscopic cholecystectomy.  She also has an upper  plate after dental extraction of the upper teeth.   PHYSICAL EXAMINATION:  GENERAL:  Cheerful, alert, oriented Caucasian female.  HEENT: Pupils equal, round, reactive to light.  NECK:  Supple.  Trachea midline.  CHEST:  Clear to auscultation.  ABDOMEN:  Nontender.  PELVIC:  External genitalia normal for age.  Cervix fibrotic posterior cuff  with band to the vaginal apex.  The  cervix is relatively thickened with the previously-mentioned dysplastic  polyp removed from the posterior lip of the cervix.  The uterus is normal  for postmenopausal status and size with questionable thickening in the  endometrium.   PLAN:  Hysteroscopy, dilatation and curettage followed by a cold knife  conization on September 08, 2003.     ___________________________________________                                         [name], M.D.   JVF/MEDQ  D:  09/07/2003  T:  09/07/2003  Job:  [id number]   cc:   Annia Friendly. Loleta Chance, M.D.  [address]  [place]  [place] [address]  Fax: [phone]

## 2010-07-20 NOTE — H&P (Signed)
NAME:  MATTY, DEAMER              ACCOUNT NO.:  [id number]   MEDICAL RECORD NO.:  [id number]          PATIENT TYPE:  AMB   LOCATION:  DAY                           FACILITY:  [facility]   PHYSICIAN:  Jerolyn Shin C. Katrinka Blazing, M.D.   DATE OF BIRTH:  [birth year]   DATE OF ADMISSION:  DATE OF DISCHARGE:  LH                                HISTORY & PHYSICAL   HISTORY OF PRESENT ILLNESS:  A 77 year old female with a history of acute  onset of mass in her lower neck on the left side dating back to early  September.  She was evaluated by Dr. Loleta Chance and referred for CT of the neck.   CT of the neck showed a large mass replacing the left lobe of the thyroid  which was irregular in contour but well circumscribed, and caused a  disruption of the entire left lobe of the thyroid with measurements of 4 x  4.4 cm.  She was referred to Dr. Patrecia Pace for evaluation.  Biopsy of the  mass showed papillary carcinoma.  She is, therefore, scheduled to have a  near total thyroidectomy.   PAST HISTORY:  1.  The patient has diabetes mellitus.  2.  Idiopathic vertigo.  3.  Seasonal allergies.  4.  Hyperlipidemia.   PREVIOUS SURGERY:  1.  Radical hysterectomy for stage IB squamous cell carcinoma of the cervix      in 2005.  2.  Laparoscopic cholecystectomy in 1994.   MEDICATIONS:  1.  Lipitor 40 mg daily.  2.  Metformin 500 mg daily.  3.  Zyrtec 10 mg daily.   ALLERGIES:  SULFA.   FAMILY HISTORY:  Positive for a stroke, diabetes mellitus, breast cancer, a  heart attack, ovarian cancer, and some type of brain tumor.   The patient was recently hospitalized for cellulitis of the lower abdomen,  which was successfully treated and has resolved.   PHYSICAL EXAMINATION:  VITAL SIGNS:  Blood pressure 128/72, pulse 85,  respirations 18.  Weight 128 pounds.  HEENT:  Unremarkable.  NECK:  Enlarged heart, nontender mass, left side of neck, without palpable  adenopathy.  LUNGS:  Clear to auscultation.  HEART:  Regular rate  and rhythm without murmur, gallop, or rub.  ABDOMEN:  Soft and nontender.  Normal bowel sounds.  No masses.  Well-healed  surgical scar.  EXTREMITIES:  No cyanosis or clubbing.  Good peripheral pulses.  No joint  deformity.  NEUROLOGIC:  Alert and oriented x3.  Cranial nerves intact.  No motor,  sensory, or cerebellar deficits.   IMPRESSION:  1.  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  2.  Diabetes mellitus.  3.  Chronic vertigo.  4.  Hyperlipidemia.   PLAN:  The patient will have near total thyroidectomy.      Dirk Dress. Katrinka Blazing, M.D.  Electronically Signed     LCS/MEDQ  D:  01/03/2005  T:  01/03/2005  Job:  [id number]

## 2010-07-20 NOTE — Discharge Summary (Signed)
NAME:  Krista [name], Krista [name]              ACCOUNT NO.:  [id number]   MEDICAL RECORD NO.:  [id number]          PATIENT TYPE:  INP   LOCATION:  A309                          FACILITY:  [facility]   PHYSICIAN:  Dirk Dress. Katrinka Blazing, M.D.   DATE OF BIRTH:  [birth year]   DATE OF ADMISSION:  01/04/2005  DATE OF DISCHARGE:  11/06/2006LH                                 DISCHARGE SUMMARY   DISCHARGE DIAGNOSES:  1.  Papillary carcinoma of thyroid involving the left lobe and isthmus.  2.  Type-2 diabetes mellitus.  3.  Chronic vertigo.   SPECIAL PROCEDURES:  Total thyroidectomy on January 04, 2005.   DISPOSITION:  The patient is discharged home in stable satisfactory  condition.  She will have a followup in the office on January 09, 2005 for  serum calcium level.   DISCHARGE MEDICATIONS:  1.  Lipitor 40 mg daily.  2.  Metformin 500 mg daily.  3.  Zyrtec 10 mg daily.  4.  Extra Strength Tylenol two every 4 hours as needed for pain.   FOLLOW UP:  A routine postoperative followup visit will be on January 17, 2005.   SUMMARY:  A 77 year old female with a history of acute onset of a mass in  the lower neck on the left side dating back to early September.  She was  evaluated by Dr. Loleta Chance and referred to a CT of the neck.  The CT of the neck  showed a large mass placed in the left lobe of the thyroid, which was  irregular in contour but well circumscribed.  Because a disruption of the  entire left lobe of the thyroid with measurements of 4.4 cm.  A biopsy done  by Dr. Johny Chess showed papillary carcinoma.  The patient was scheduled to  have near total thyroidectomy.  The patient was admitted through day surgery  and underwent near total thyroidectomy on January 04, 2005 uneventfully.  There were two lymphs nodes seen that were removed.  No other nodes were  noted.  She had a slight drop in her serum calcium on the first  postoperative day but it had started to increase by the time of discharge.  She had no  difficulty with her voice.  She did not have any sore throat.  There was minimal JP drainage.  By the sixth she was doing well.  Her JP  drain was discontinued and she was scheduled to have a followup in the  office as noted above.      Dirk Dress. Katrinka Blazing, M.D.  Electronically Signed    LCS/MEDQ  D:  02/10/2005  T:  02/11/2005  Job:  [id number]

## 2010-07-20 NOTE — Group Therapy Note (Signed)
NAME:  Krista [name], Krista [name]              ACCOUNT NO.:  [id number]   MEDICAL RECORD NO.:  [id number]          PATIENT TYPE:  INP   LOCATION:  A341                          FACILITY:  [facility]   PHYSICIAN:  Margaretmary Dys, M.D.DATE OF BIRTH:  1931/04/15   DATE OF PROCEDURE:  DATE OF DISCHARGE:                                   PROGRESS NOTE   SUBJECTIVE:  The patient is doing much better.  She says the redness has  actually gone away.  She has had no fever or chills.  Overall it is  comfortable.   OBJECTIVE:  Comfortable, not in acute distress.  VITAL SIGNS:  Blood pressure 122/74.  Pulse 68.  Respiration 16.  Temperature 97.7.  Blood sugars are ranged between 126 to 150.  HEENT EXAM:  Normocephalic, atraumatic.  Oral mucosa is moist.  No exudates.  NECK:  Supple.  No JVD.  LUNGS:  Good air entry bilaterally.  HEART:  Regular S1, S2.  No S3, S4, gallops, or rubs.  ABDOMEN:  Soft, nontender.  PELVIC AREA:  Significantly improved erythema and induration in the pelvic  area.  Satellite lesions in the thigh area  have actually disappeared.  CNS EXAM:  Grossly intact with no focal deficits.   LABORATORY DATA:  White cell count 4.7.  Hemoglobin 11.1.  Hematocrit 32.  Platelet count 268 with no left shift.  Sodium 145.  Potassium 3.9.  Chloride 115.  CO2 25.  Glucose 106.  BUN 9.  Creatinine 1.1.  Calcium 8.8.  Blood cultures both grew group B Streptococcus from the two bottles dated  September 20, 2005.   ASSESSMENT AND PLAN:  1.  Streptococcal bacteremia, likely source is from cellulitis of the pelvic      area which is markedly improved.  Subsequent blood cultures returned      negative.  I do think that the Zosyn should provide adequate coverage      for the Streptococcus and will discontinue her Vancomycin at this time.      The patient may need a two-dimensional echocardiogram to rule out any      evidence of vegetations and carditis.  She is, otherwise, stable.  2.  Diabetes.  Blood sugars  are in fairly satisfactory range.   DISPOSITION:  Continue on IV antibiotics over the next day or two, and the  patient may go home on oral antibiotics.  She remains stable with no  evidence of sepsis.      Margaretmary Dys, M.D.  Electronically Signed    AM/MEDQ  D:  09/22/2005  T:  09/22/2005  Job:  [id number]

## 2010-07-20 NOTE — Consult Note (Signed)
NAME:  CHIANTI, GOH              ACCOUNT NO.:  [id number]   MEDICAL RECORD NO.:  [id number]          PATIENT TYPE:  AMB   LOCATION:  DAY                           FACILITY:  [facility]   PHYSICIAN:  Kassie Mends, M.D.      DATE OF BIRTH:  January 22, 1932   DATE OF CONSULTATION:  04/09/2006  DATE OF DISCHARGE:                                 CONSULTATION   PROBLEM LIST:  1. Acute onset of vomiting associated with a dilated small bowel      segment, with small bowel wall thickening on computed tomography      scan, December 2007.  2. Thickened sigmoid colon on computed tomography scan 12/07.  3. Diabetes.  4. Hypothyroidism.  5. Cholecystectomy in 1994.  6. Thyroid cancer.  7. History of cervical cancer requiring surgery and radiation therapy      in 2005, complicated by pelvic dermal cellulitis in October2006   SUBJECTIVE:  Ms. Schatz is a 77 year old female who presents as a return  patient visit.  She was first seen in the hospital as a consultation in  December2007.  She presented with nausea and vomiting.  CT scan was  performed which showed thickened small bowel and sigmoid colon.  She was  seen and evaluated by surgery and me.  It was felt that the thickened  loop of small bowel was secondary to radiation changes.  Conservative  management was instituted.  She symptomatically improved.  She was seen  and evaluated as an outpatient today to assess the need for  sigmoidoscopy.  Since her discharge, she has done fairly well, except  last Tuesday she developed chills and a low-grade fever and was placed  on antibiotics.  Currently, she denies any diarrhea, abdominal pain,  nausea, or vomiting, and feels pretty good.  She denies any blood in her  bowel movements and is having 1-2 a day.  She describes her appetite as  pretty good.   MEDICATIONS:  1. Lipitor  2. Metformin.  3. Aspirin.  4. Synthroid.  5. Ibuprofen as needed.   OBJECTIVE:  VITAL SIGNS:  Weight 122 pounds (down 1-1/2  pounds since  December2007), height 5 feet 2 inches, temperature 98.4, blood  pressure 120/84, pulse 72.  LUNGS:  Clear to auscultation bilaterally.  CARDIOVASCULAR:  Regular rate and rhythm, no murmur.  ABDOMEN:  Bowel  sounds present, soft, nontender, nondistended.  No rebound or guarding.   ASSESSMENT:  Ms. Lafontant is a 77 year old female with a thickened loop of  sigmoid colon on CT scan.  The differential diagnosis includes changes  secondary to radiation from her cervical cancer therapy and  diverticulosis, or underdistended colon during CT scan.  She has a low  likelihood of colon cancer.  She has had a colonoscopy within the last 5  years.  Thank you for allowing me to see Ms. Berkery in consultation.  My  recommendations follow.   RECOMMENDATIONS:  1..  Ms. Krizan will be scheduled for a sigmoidoscopy  within the next month.  She will have clear liquids beginning with lunch  on the day prior to her  sigmoidoscopy.  1. She will follow up with me in 6 months.  I will consider a repeat      CT Scan at that time.  2. She should continue a low residue diet.      Kassie Mends, M.D.  Electronically Signed     SM/MEDQ  D:  04/09/2006  T:  04/10/2006  Job:  [id number]   cc:   Tesfaye D. Felecia Shelling, MD  Fax: [phone]

## 2010-07-20 NOTE — Consult Note (Signed)
NAME:  Krista [name], FAKE NO.:  [id number]   MEDICAL RECORD NO.:  [id number]                   PATIENT TYPE:  OUT   LOCATION:  GYN                                  FACILITY:  [facility]   PHYSICIAN:  De Blanch, M.D.         DATE OF BIRTH:  [birth year]   DATE OF CONSULTATION:  09/14/2003  DATE OF DISCHARGE:                                   CONSULTATION   A 77 year old white female seen in consultation at the request of Dr. Christin Bach of [place] regarding management of a newly diagnosed cervical  cancer.   The patient apparently presented to her primary physician, Mirna Mires, with  abnormal bleeding.  Evaluation ultimately resulted in a LEEP procedure  revealing focus of microinvasion of squamous cell carcinoma.  It is noted  that the invasive component extends to the surgical margins and there is  tumor at nearly all margins.  The depth of invasion is not reported.   The patient reports that she has not had a Pap smear in over five years.  She has no other pain or constitutional symptoms.   PAST MEDICAL HISTORY:  1. Diabetes on oral agent.  2. Elevated cholesterol.  3. Vertigo.   CURRENT MEDICATIONS:  1. Metformin.  2. Lipitor.  3. Meclizine.   PAST SURGICAL HISTORY:  1. Laparoscopic cholecystectomy 1994.  2. D&C following a missed abortion.   FAMILY HISTORY:  The patient has a sister with breast cancer and a sister  with possible ovarian cancer.  The patient is widowed.  She is a Futures trader.  She comes accompanied by two of her daughters today.  She is a past smoker.   PAST OBSTETRICAL HISTORY:  Gravida 4.   REVIEW OF SYSTEMS:  Negative except as noted above.   PHYSICAL EXAMINATION:  VITAL SIGNS:  Height 5'1, weight 124 pounds.  GENERAL:  The patient is a pleasant white female in no acute distress.  HEENT:  Negative.  NECK:  Supple without thyromegaly.  LYMPH:  There is no supraclavicular or inguinal adenopathy.  ABDOMEN:   Soft, nontender.  No mass, organomegaly, ascites, or hernias  noted.  PELVIC:  EGBUS, vagina, bladder, urethra are normal, but atrophic.  The  cervix is flush at the vaginal vault, is only one week following LEEP  procedure.  It is still somewhat granular and bleeds slightly on contact.  No gross lesions are noted.  Bimanual examination reveals a normal cervix  flush to the vaginal vault.  There is some thickening just to the left of  the cervix which feels like postoperative induration.  Rectovaginal  examination confirms.  The uterus is normal size otherwise.   IMPRESSION:  Cervical cancer, likely more extensive than microinvasion.  We will obtain the outside slides and have them reviewed here at [facility].  Given the patient's symptoms of bleeding I suspect she has a  truly invasive cancer.  I therefore discussed with  the patient and her  daughters treatment options for stage IB1 cervical cancers including radical  hysterectomy with pelvic lymphadenectomy or radical radiation therapy.  The  pros and cons of each were discussed in detail as well as the risks and  benefits.  We will await the pathology review before making a final  recommendation.  All the patient's questions are answered as are the  questions of her daughters'.  We will contact her when we have pathology  review completed.                                               De Blanch, M.D.    DC/MEDQ  D:  09/14/2003  T:  09/14/2003  Job:  [id number]   cc:   [name], M.D.  [address] [place]  [place] [address]  Fax: [phone]   Annia Friendly. Loleta Chance, M.D.  [address]  [place]  [place] [address]  Fax: [phone]   Telford Nab, R.N.  [address]  [place], [place] [address]

## 2010-07-20 NOTE — Discharge Summary (Signed)
NAME:  Krista [name], Krista [name]              ACCOUNT NO.:  [id number]   MEDICAL RECORD NO.:  [id number]          PATIENT TYPE:  INP   LOCATION:  A303                          FACILITY:  [facility]   PHYSICIAN:  Margaretmary Dys, M.D.DATE OF BIRTH:  17-Jan-1932   DATE OF ADMISSION:  06/01/2005  DATE OF DISCHARGE:  04/03/2007LH                                 DISCHARGE SUMMARY   DISCHARGE DIAGNOSES:  1.  Cellulitis of the pubic area.  2.  Poorly controlled type 2 diabetes.  3.  A history of thyroid cancer, status post surgery.   DISCHARGE MEDICATIONS:  1.  Augmentin 875 mg p.o. b.i.d.  2.  Nystatin cream to skin twice a day.  3.  Lipitor 40 mg p.o. once a day.  4.  Metformin 500 mg p.o. once a day.  5.  Aspirin 81 mg once a day.  6.  Synthroid 1 tablet daily.   DIET:  1800 ADA diabetic diet.   ACTIVITY:  As tolerated.   FOLLOWUP:  The patient is to follow up with the primary care physician as  needed.   HOSPITAL COURSE:  She is a 77 year old Caucasian female who presented to the  emergency room with fever, chills and redness in the pubic area. The patient  was seen in the past and found to have cellulitis of the pubic area and was  treated with IV antibiotics about a week ago.  It appears that she has had a  recurrence during this hospitalization.  She did not have any vaginal  discharge or itching around the vulvar region.  The patient was subsequently  admitted and started on Zosyn 3.375 gm IV q.6h and better control of her  blood sugars with her oral hypoglycemic agents and sliding scale was  acheived.  During the course of hospitalization, the patient was also seen  by the obstetrician and gynecologist, who felt that this is likely to be  cellulitis but also agreed to continue with nystatin cream to rule out  fungal infection.  The patient had significant improvement in her symptoms  with almost  complete resolution on June 03, 2005.  The IV was changed to p.o.  antibiotics.  On June 04, 2005, when I saw her she was doing much better and  was discharged home in satisfactory condition.   DISPOSITION:  Home.      Margaretmary Dys, M.D.  Electronically Signed     AM/MEDQ  D:  07/28/2005  T:  07/28/2005  Job:  [id number]

## 2010-07-20 NOTE — H&P (Signed)
NAME:  Krista [name], Krista [name]              ACCOUNT NO.:  [id number]   MEDICAL RECORD NO.:  [id number]          PATIENT TYPE:  INP   LOCATION:  A303                          FACILITY:  [facility]   PHYSICIAN:  Margaretmary Dys, M.D.DATE OF BIRTH:  1931-07-22   DATE OF ADMISSION:  06/01/2005  DATE OF DISCHARGE:  LH                                HISTORY & PHYSICAL   PRIMARY CARE PHYSICIAN:  Annia Friendly. Loleta Chance, M.D.   ADMISSION DIAGNOSES:  1.  Fever.  2.  Cellulitis of the pubic area.  3.  Poorly-controlled type 2 diabetes.  4.  History of cervical cancer, status post surgery and radiation therapy.  5.  History of thyroid cancer, status post surgery.   CHIEF COMPLAINT:  Fever and redness in the pubic area.   HISTORY OF PRESENT ILLNESS:  Krista [name] is a 77 year old Caucasian female  who presented to the emergency room with fever and some chills and also  redness in the pubic area.  The patient has actually had symptoms in the  past and was found to have cellulitis of the pubic area, for which she was  treated with IV antibiotics about a year ago.  She reports a recurrence now.   The patient denies any nausea, vomiting, abdominal pain.  No headaches,  dizziness or lightheadedness.   She denies any vaginal discharge.  She denies any itching around the vulva  region.   REVIEW OF SYSTEMS:  A 10-point review of systems otherwise negative, except  as mentioned in the history of present illness.   PAST MEDICAL HISTORY:  1.  Type 2 diabetes.  2.  Idiopathic vertigo.  3.  Hyperlipidemia.  4.  Seasonal allergies.  5.  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  6.  Radial hysterectomy at [facility] on July 02, 2003 secondary      to stage IB squamous cell carcinoma of the cervix.  7.  Laparoscopic cholecystectomy in 1994.  8.  Twenty-eight radiation treatments to the pelvic area at the [facility] in [place].   MEDICATIONS:  1.  Lipitor 40 mg p.o. once a day.  2.  Metformin 500 mg p.o. daily.  3.  Zyrtec 10 mg p.o. once a day.  4.  Synthroid once a day.  5.  Aspirin 81 mg once a day.  6.  Magnesium as needed.   ALLERGIES:  SULFA which causes a rash.   SOCIAL HISTORY:  The patient lives alone.  Denies any alcohol, drug use or  smoking.   FAMILY HISTORY:  Positive for stroke, diabetes, breast cancer, heart attack.  Otherwise, not really relevant.   PHYSICAL EXAMINATION:  GENERAL:  The patient is alert, comfortable, not in  acute distress.  VITAL SIGNS:  Vital signs on arrival in the emergency room revealed blood  pressure of 116/58, pulse 94, respirations 18, temperature 100.9 degrees  Fahrenheit, oxygen saturation 98% on room air.  HEENT:  Normocephalic and atraumatic.  Oral mucosa was moist.  No exudates.  NECK:  Supple.  No JVD or lymphadenopathy.  LUNGS:  Clear clinically with  good air entry bilaterally.  HEART:  S1 and S2 regular.  No gallops or rubs.  ABDOMEN:  Soft, nontender.  GENITOURINARY:  The patient has significant cellulitis with redness in the  pubic area.  She also had some satellite lesions on the left thigh.  CNS EXAM:  Grossly intact.   LABORATORY/DIAGNOSTIC DATA:  White blood cell count was 11.6, hemoglobin  12.5, hematocrit 37.5, platelet count 374, neutrophils of 90%.  Sodium was  135, potassium 4, chloride 106, CO2 of 22, glucose of 161, BUN 16,  creatinine 1.0, total bilirubin 0.7, alkaline phosphatase 128, AST of 34,  ALT of 20.  Total protein 6.7, albumin 3.6, calcium 9.1, lipase of 49.  Urinalysis was negative.   ASSESSMENT AND PLAN:  1.  Krista [name] is a 77 year old Caucasian female now being admitted with      cellulitis of the pubic area, possibly from ammoniacal dermatitis.  The      patient did inform me that she has continuous leakage from her urethra      ever since she had her surgery to remove her uterus.  The patient      sometimes passes urine before getting to the bathroom.  I suspect that       this is partly from contact dermatitis or ammoniacal dermatitis,      possibly a fungal infection, especially with the satellite lesions on      the left thigh.  The plan is to admit her to 3A at this time.  Will put      her on Zosyn 3.37 gm IV q.6h.  Will put her also on some nystatin cream.      I will request OB/GYN to see her and evaluate for any other possible co-      existing disease in the pubic area.  2.  Diabetes.  Blood sugars are fairly stable.  Will continue on sliding      scale and her oral Glucophage.  3.  History of cervical cancer, stable.  Continue follow up with the OB/GYN      doctors , denies any bleeding from the vagina.  4.  History of status post thyroid cancer surgery.  The patient is stable      and denies any symptoms suggestive of hyper or hypothyroidism.   I have discussed the above plan with the patient, and she verbalized full  understanding.      Margaretmary Dys, M.D.  Electronically Signed     AM/MEDQ  D:  06/02/2005  T:  06/02/2005  Job:  [id number]

## 2010-07-20 NOTE — Consult Note (Signed)
NAME:  Krista [name], Krista [name]              ACCOUNT NO.:  [id number]   MEDICAL RECORD NO.:  [id number]          PATIENT TYPE:  OUT   LOCATION:  GYN                          FACILITY:  [facility]   PHYSICIAN:  Paola A. Duard Brady, MD    DATE OF BIRTH:  [birth year]   DATE OF CONSULTATION:  10/16/2005  DATE OF DISCHARGE:                                   CONSULTATION   Krista [name] is a 77 year old with a history of stage IA grade 1 endometrioid  adenocarcinoma who underwent a radical hysterectomy in 2005.  This was  followed by postoperative chemoradiation secondary to intermediate risk  factors.  She was last seen by [place] in December 2006, and by Dr. Roselind Messier in  April 2007, both times she had negative evaluations.  She comes in today for  followup.  She is accompanied by one of her daughters.  She is overall doing  fairly well.  She had an episode this morning that she has not had before  where she had acute mid abdominal discomfort at her navel.  It lasted about  2 hours.  It was better when she was walking around.  It got that worse when  she lay still.  She felt slightly nauseated with no emesis.  The pain did  awake her from sleep and she states it went away as quickly as it came.  Since that time, she has been able to eat.  She denies any nausea, vomiting,  any repeat pain or tenderness.  She states it felt differently than a gas  pain as it was not fleeing and it did not move.  It was in one location.  As  stated, she has never had this discomfort in the past.   REVIEW OF SYSTEMS:  She denies any vaginal bleeding.  She has pain only as  above.  She has occasional urge incontinence and is slowly improving over  time.  She denies any other significant change in her bowel or bladder  habits.  She did have an admission to [facility] from July 25 to September 24, 2005, where she had erythema on her lower abdomen.  We do not have any  records of this.  What we did obtain was a technetium labeled WBC scan which  was performed in Jul 05, 2005.  It showed no focal areas of any abnormality  and was otherwise negative.  She states that they do not have a good idea  why she developed these rashes.  She developed some fevers, chills.  The  rash comes, goes away in about 2 hours.  It is in the distribution of the  pelvis only to the lower thigh, was treated with antibiotics and it goes  away.  She denies any chest pain, shortness of breath, unintentional weight  loss, weight gain, fevers or chills other than those as described above.   MEDICATION LIST:  Reviewed and is unchanged.   PHYSICAL EXAMINATION:  VITAL SIGNS:  Weight 123 pounds, blood pressure  130/72.  GENERAL:  A well nourished, well developed female in no acute distress.  NECK:  Supple.  There is no lymphadenopathy, no thyromegaly.  LUNGS:  Clear to auscultation bilaterally.  CARDIOVASCULAR:  Regular rate and rhythm.  ABDOMEN:  Shows a well-healed transverse skin incision.  Abdomen is soft,  nontender, nondistended.  There are no palpable masses or  hepatosplenomegaly.  Groins are negative for adenopathy.  EXTREMITIES:  There is no edema.  PELVIC:  External genitalia is within normal limits.  Vagina is markedly  atrophic.  The vagina is agglutinated with post radiation changes at the top  of the vaginal cuff.  The vaginal cuff is visualized.  There are no gross  visible lesions.  ThinPrep Pap was submitted without difficulty.  Bimanual  examination is somewhat limited secondary to the agglutination and  foreshortening of the vagina.  However, on rectovaginal examination the  vaginal cuff is smooth with no nodularity.   ASSESSMENT:  A 77 year old that with stage I B1 cervical carcinoma, status  post stress radical hysterectomy, radiation and chemo sensitization  secondary to two intermediate risk factors, who has no evidence of recurrent  disease.   PLAN:  1. To follow up results of her Pap smear from today.  2. We will obtain records  from [facility] regarding these admissions.  We      can try to delineate the etiology of this rash, whether it is a      cellulitis or more of an inflammatory process.  3. She will return to see Dr. Roselind Messier in 4 months and return to see [place] in 8      months.      Paola A. Duard Brady, MD  Electronically Signed     PAG/MEDQ  D:  10/16/2005  T:  10/16/2005  Job:  [id number]   cc:   Telford Nab, R.N.  [address]  [place], [place] [address]   Billie Lade, M.D.  Fax: [phone]   [name], M.D.  Fax: [phone]   Annia Friendly. Loleta Chance, MD  Fax: [phone]

## 2010-07-20 NOTE — Op Note (Signed)
NAME:  Krista [name], Krista [name]                        ACCOUNT NO.:  [id number]   MEDICAL RECORD NO.:  [id number]                   PATIENT TYPE:  AMB   LOCATION:  DAY                                  FACILITY:  [facility]   PHYSICIAN:  [name], M.D.              DATE OF BIRTH:  1931/06/05   DATE OF PROCEDURE:  09/08/2003  DATE OF DISCHARGE:  09/08/2003                                 OPERATIVE REPORT   PREOPERATIVE DIAGNOSES:  1. Cervical dysplasia.  2. History of questionable endocervical polyp.   POSTOPERATIVE DIAGNOSES:  1. Cervical dysplasia.  2. No endocervical polyp.  3. Rule out carcinoma of the cervix.   OPERATION/PROCEDURE:  1. Dilatation and curettage.  2. Full left conization.   SURGEON:  [name], M.D.   ASSISTANT:  None.   ANESTHESIA:  Spinal.   COMPLICATIONS:  None.   FINDINGS:  Adhesions from the posterior lip of the cervix to the posterior  fornix with a bulbous, friable cervix that bled easily, distinctly typical  in tissue character.   DESCRIPTION OF PROCEDURE:  The patient was taken to the operating room,  prepped and draped for vaginal procedure after spinal anesthesia introduced.  Cervix was quite large for the postmenopausal status, 3 cm transverse  diameter.  Speculum was inserted and the cervix released of the then  adhesions from the posterior lip to the posterior vaginal wall that appeared  to be old surgical adhesions, probably related to polypectomy years ago.  The patient's cervix was easily thickened and it was easily dilated  sufficiently to allow curettage with a smooth, sharp curet.  Uterine only  sounded to 6 cm.  The endometrial curetting were minimal and did not have  any suspicion of any polypoid intrauterine structures.  There may have been  some lower uterine segment curettage performed.  There was a portion of the  lower uterine segment where cervical curettage was a portion of the  specimen.  We then proceeded with  conization, resecting a 2 cm wide x 1 cm  deep ellipse of tissue.  The orientation of the cervix posteriorly did not  allow for removal of the specimen all in one piece, so we took it out with a  anterior lip in one section and the posterior lip in the other with tagging  of the specimen at noon on the anterior specimen and 9 o'clock on the  posterior for proper orientation.  The specimen bled more than usual,  required both Monsel solution and Surgicel which was stitched in place to  old it until followup.  The specimen had distinctly atypical character,  somewhat friable.  The patient tolerated the procedure well. Went to the  recovery room in good condition.      ___________________________________________  [name], M.D.   JVF/MEDQ  D:  09/18/2003  T:  09/19/2003  Job:  [id number]   cc:   De Blanch, M.D.

## 2010-07-20 NOTE — H&P (Signed)
NAME:  Krista [name], Krista [name]              ACCOUNT NO.:  [id number]   MEDICAL RECORD NO.:  [id number]          PATIENT TYPE:  INP   LOCATION:  A312                          FACILITY:  [facility]   PHYSICIAN:  Melvyn Novas, MDDATE OF BIRTH:  [birth year]   DATE OF ADMISSION:  04/12/2006  DATE OF DISCHARGE:  LH                              HISTORY & PHYSICAL   Patient is a 77 year old white female with a history of cervical cancer,  status post total hysterectomy with recurrent group B streptococcal  pelvic infections, admitted multiple times for antibiotics.  She also  has a history of stress urinary incontinence.  The patient presented  with fever and chills starting 18 hours ago and noticed an ascending  erythematous rash in the pubic area.  This is identical to presentation  previously.  She was cultured appropriately and placed on ampicillin, as  previous admissions dictated by the emergency room physician.  Concerns  are also to cover Staphylococcus bacteria.  She has a little cough but  no sputum production.  She denies seizure activity.  She does have  chronic urinary incontinence but specifically denies dysuria or  frequency.  She has no vaginal discharge of significance and no melena  or hematochezia.   Patient is admitted for cellulitis and consideration at this time is  given to intravaginal cutaneous fistula as a possibility of recurrent  infections or obstructive lymphatic drainage from irradiation therapy.   PAST MEDICAL HISTORY:  Significant for hyperlipidemia, diabetes,  hypothyroidism secondary to thyroidectomy, thyroid cancer.   PAST SURGICAL HISTORY:  Remarkable for cholecystectomy, total abdominal  hysterectomy, and bilateral salpingo-oophorectomy.   She smoked off and on over the years but has not currently smoked for  the past 10 years.   CURRENT MEDICATIONS:  1. Metformin 1000 once daily.  2. Lipitor 40 a day.  3. Aspirin 81 a day.  4. Synthroid 0.125 a  day.  5. Ibuprofen p.r.n.   PHYSICAL EXAMINATION:  VITAL SIGNS:  Blood pressure 129/64, temperature  100.3, pulse 88 and regular, respiratory rate 18.  HEENT:  Head is normocephalic and atraumatic.  Eyes PERRLA.  Extraocular  movements are intact.  Sclerae are clear.  Conjunctivae pink.  NECK:  No jugular venous distention, no carotid bruits, no thyromegaly  or thyroid bruits.  LUNGS:  Scattered rhonchi.  Mild expiratory phase.  Possible end-  expiratory wheeze.  No rales appreciable.  HEART:  Regular rhythm.  No murmurs or gallops, heaves or rubs.  ABDOMEN:  Soft and nontender.  Bowel sounds are normoactive.  No  guarding or rebound.  No masses or megaly.  PELVIC:  Perineum was reported by ER physician as erythematous.  EXTREMITIES:  No clubbing, cyanosis or edema.  NEUROLOGIC:  Cranial nerves II-XII grossly intact.  Patient moves all  four extremities.   IMPRESSION:  1. Recurrent pubic cellulitis.  2. Diabetes.  3. Hyperlipidemia.  4. Hypothyroidism.  5. Status post cervical cancer with irradiation, consider entero- or      vaginal cutaneous fistula.  6. Consider obstructive lymphatic drainage due to irradiation therapy.   PLAN:  Admit. Culture  appropriately.  Continue ampicillin, as ordered,  and add Rocephin 1 gm IV daily.  Make further recommendations as the  data base expands.      Melvyn Novas, MD  Electronically Signed     RMD/MEDQ  D:  04/12/2006  T:  04/12/2006  Job:  [id number]

## 2010-07-20 NOTE — Op Note (Signed)
NAME:  Krista [name], Krista [name]              ACCOUNT NO.:  [id number]   MEDICAL RECORD NO.:  [id number]          PATIENT TYPE:  AMB   LOCATION:  DAY                           FACILITY:  [facility]   PHYSICIAN:  Kassie Mends, M.D.      DATE OF BIRTH:  11/15/1931   DATE OF PROCEDURE:  04/23/2006  DATE OF DISCHARGE:                               OPERATIVE REPORT   PROCEDURE:  Sigmoidoscopy.   INDICATION FOR EXAM:  Krista [name] is a 77 year old female with a  thickened sigmoid colon on CT scan.   FINDINGS:  1. Krista [name] had an angulated sigmoid colon approximately 15 cm from      the anal verge.  The sigmoid colon was folded on itself and is      likely the area of thickened sigmoid colon seen on CT scan.  She      had rare sigmoid diverticulosis.  Otherwise no polyps, masses,      inflammatory changes or arteriovenous malformations seen.  2. Normal retroflexed view of the rectum.   RECOMMENDATIONS:  1. High fiber diet.  She is asked just to add a small amount of fiber      to her diet.  She is given a handout on high-fiber diet and      diverticulosis.  She is cautioned against adding to much fiber to      her diet due to the known loop of dilated small bowel from her      prior radiation.  2  She has a follow-up appointment to see me in six months.   MEDICATIONS:  1. Demerol 1 mg IV.  2. Versed 4 mg IV.   PROCEDURE TECHNIQUE:  Physical exam was performed.  Informed consent was  obtained from the patient after explaining the benefits, risks and  alternatives to the procedure.  The patient was connected to the monitor  and placed in the left lateral position.  Continuous oxygen was provided  by nasal cannula and IV medicine administered through an indwelling  cannula.  After administration of sedation and rectal exam, the  patient's rectum was intubated.  The scope was advanced under direct visualization to the proximal  descending colon.  The scope was subsequently removed slowly by  carefully  examining the color, texture, anatomy and integrity of the  mucosa on the way out.  The patient was recovered in endoscopy suite  discharged home in satisfactory condition.      Kassie Mends, M.D.  Electronically Signed     SM/MEDQ  D:  04/23/2006  T:  04/24/2006  Job:  [id number]   cc:   Tesfaye D. Felecia Shelling, MD  Fax: [phone]

## 2010-07-20 NOTE — H&P (Signed)
NAME:  Krista [name], Krista [name]              ACCOUNT NO.:  [id number]   MEDICAL RECORD NO.:  [id number]          PATIENT TYPE:  INP   LOCATION:  A341                          FACILITY:  [facility]   PHYSICIAN:  Margaretmary Dys, M.D.DATE OF BIRTH:  23-Sep-1931   DATE OF ADMISSION:  09/20/2005  DATE OF DISCHARGE:  LH                                HISTORY & PHYSICAL   PRIMARY CARE PHYSICIAN:  Dr. Evlyn Courier.   ADMISSION DIAGNOSES:  1. Fever.  2. Recurrent cellulitis of the pubic area.  3. Poorly controlled type 2 diabetes.  4. History of cervical cancer, status post surgery and radiation therapy.  5. History of thyroid cancer, status post surgery.   CHIEF COMPLAINT:  Fever and swelling with redness in the pubic area.   HISTORY OF PRESENT ILLNESS:  Krista [name] is a 77 year old Caucasian female  who is known to the hospitalist service from a prior admission back in March  2007 for fever and cellulitis, and also poorly controlled type 2 diabetes,  essentially the same complaints that she has today.  She reports that since  she was discharged over 4 weeks ago, she has had another episode for which  she saw Dr. Loleta Chance, who prescribed some oral antibiotics; this help and the  cellulitis resolved.  However, this morning she developed some fevers again  and her measured fever at home was 100.7.  She also noticed some redness in  her pubic area.  She denies any nausea or vomiting or abdominal pain.  No  headaches, dizziness or lightheadedness.  No vaginal discharge or itching  around the vulva.  She denies any dysuria, frequency or urgency.  She has no  hematuria.  The patient reports that her symptoms are essentially the same  as back in June and also in April when she was admitted to the hospital.   She has been planned by Dr. Loleta Chance to see a surgeon and also the radiation  physicians on October 16, 2005 to help figure out why she keeps having  recurrent cellulitis of the pubic area; there is a  suspicion that it  probably has to do with her prior radiation due to cervical cancer.   REVIEW OF SYSTEMS:  A 10-point review of systems is otherwise negative,  except as mentioned in the history of present illness.   PAST MEDICAL HISTORY:  1. Type 2 diabetes.  2. Idiopathic vertigo.  3. Hyperlipidemia.  4. Seasonal allergies.  5. 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  6. 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  7. Laparoscopic cholecystectomy in 1994.  8. Twenty-eight radiation treatments to the pelvic area at [facility] in [place].   MEDICATIONS:  1. Metformin XR 1000 mg once a day.  2. Lipitor 40 mg p.o. once a day.  3. Synthroid 125 mcg p.o. once a day.  4. Meclizine 25 mg p.o. q.8 h. p.r.n.  5. Magnesium as needed.  6. Aspirin 81  mg p.o. once a day.   ALLERGIES:  SULFA, which causes a rash.   SOCIAL HISTORY:  The patient lives alone, denies any alcohol or drug abuse  or smoking.   FAMILY HISTORY:  Positive for diabetes, stroke, breast cancer and coronary  artery disease.   PHYSICAL EXAMINATION:  GENERAL:  She is alert, comfortable, pleasant, not in  acute distress.  VITAL SIGNS:  Blood pressure was 162/77, pulse of 108, respirations were 18  and temperature was 101.4 degrees Fahrenheit in the emergency room.  Oxygen  saturation was 98% on room air.  HEENT:  Normocephalic, atraumatic.  Oral mucosa was dry.  No exudates were  noted.  NECK:  Supple.  No JVD.  No lymphadenopathy.  LUNGS:  Clear clinically with good air entry bilaterally.  HEART:  S1 and S2 regular.  No S3, S4, gallops or rubs.  ABDOMEN:  Soft and nontender.  Bowel sounds are positive.  PELVIC:  The patient  has evidence of erythema with swelling in the pubic  area.  She also has satellite lesions on her left thigh, essentially the  same as when I saw her back 75-4  months ago.  CNS:  Grossly intact.   LABORATORY/DIAGNOSTIC DATA:  Her white blood cell count was 10.7, hemoglobin  of 12.4, hematocrit 36.4, platelet count was 273,000; neutrophils were 91%.  Sodium 139, potassium 4.3, chloride of 104, CO2 was 27, glucose 255, BUN of  20, creatinine 1.0, total bilirubin was 0.7, AST of 38, ALT of 29.  Calcium  was 9.4.  Urinalysis was negative, except for positive glucose.  Blood  culture have been drawn and are pending.   ASSESSMENT AND PLAN:  1. Ms. [name] is a 77 year old Caucasian female who presents to the      emergency room with fever and redness of the pubic area.  She has      cellulitis, which the patient said she has had 4 episodes in the past 5      months.   She is hemodynamically stable at this time with no evidence of shock or  sepsis.  Plan is to admit her to the medical floor at this time.  We will  start her on Zosyn 3.375 g intravenously q.6 h.  We will resume all her  previous medications.  The patient was seen in the past by  Obstetrics/Gynecology, who felt that there was no intravaginal lesion and  agreed that this was mostly cellulitis.   The patient is scheduled to see her surgeon/radiation physicians in August  due to these recurrent episodes of cellulitis.   1. Diabetes.  Blood sugars are poorly controlled at this time.  We will      continue her on her oral hypoglycemic agent and also her sliding scale.   1. History of cervical cancer.  The patient denies any bleeding or      discharge from the vagina.   The patient is stable.  We will continue to watch for her blood cultures.  She did not grow any organism the last time.  She will be on a diabetic  diet.  Deep venous thrombosis prophylaxis with Lovenox.      Margaretmary Dys, M.D.  Electronically Signed     AM/MEDQ  D:  09/21/2005  T:  09/21/2005  Job:  [id number]   cc:   Annia Friendly. Loleta Chance, MD  Fax: [phone]

## 2010-07-20 NOTE — H&P (Signed)
NAME:  Krista [name], Krista [name]              ACCOUNT NO.:  [id number]   MEDICAL RECORD NO.:  [id number]          PATIENT TYPE:  INP   LOCATION:  A331                          FACILITY:  [facility]   PHYSICIAN:  Annia Friendly. Loleta Chance, MD     DATE OF BIRTH:  [birth year]   DATE OF ADMISSION:  12/12/2004  DATE OF DISCHARGE:  LH                                HISTORY & PHYSICAL   IDENTIFYING DATA:  The patient is a 77 year old, gravida 5, para 5, AB0,  white female widow, from [place], [place].   CHIEF COMPLAINT:  Nausea, vomiting, and dizziness.   HISTORY OF PRESENT ILLNESS:  The patient has been experiencing these  symptoms for less than 12 hours before admission.  Problems started after  eating breakfast around 0830 on the day of admission.  The patient vomited  x3.  The vomitus was initially food stuff.  Dizziness is described as the  room spinning around.  History is positive for dizziness off and on since  July 2006 with negative CT of the head on May 06, 2003, and September 25, 2004.  Each time CT of the head demonstrated no evidence of intracranial  abnormality.  She also denies earache, double vision, sore throat, dysuria,  gross hematuria, diarrhea, etcetera.  History is positive for mild episodic  cough.  Cough is productive at times.  Sputum is described as white.  History is also positive for some runny nose (clear) and frontal headache.   MEDICAL HISTORY:  1.  Type 2 diabetes mellitus.  2.  Idiopathic vertigo.  3.  Hyperlipidemia.  4.  Seasonal allergies.  5.  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  6.  Hospitalization for pregnancy.  7.  Radical hysterectomy at [facility], on July 02, 2003,      secondary to stage IB I squamous cell carcinoma of the cervix.  8.  Laparoscopic cholecystectomy in 1994.  9.  The patient also had 28 treatments involving radiation therapy to her      pelvic area at [facility] [place] Antony Blackbird.  Medical history is negative for hypertension, tuberculosis, cystic fibrosis,  asthma, seizure disorder.   ALLERGIES:  SULFA DRUGS.   PRESCRIBED MEDICINES:  On admission are:  1.  Lipitor 40 mg p.o. every day at bedtime.  2.  Metformin 500 mg p.o. every day with lunch.  3.  Zyrtec 10 mg p.o. every day.   HABITS:  Positive for former tobacco use.  Habits are negative for ethanol  and street drugs.   SEXUALLY TRANSMITTED DISEASE HISTORY:  Is negative for gonorrhea, syphilis,  herpes, and HIV infection.   FAMILY HISTORY:  Revealed mother deceased at 39 secondary to complications  of stroke.  Father deceased in his 37s secondary to complications of stroke.  Four sisters living, age 79 - history of obesity, age 75 - history of  diabetes mellitus, age 57 - good health, age 70- with a history of diabetes  and breast cancer.  Three sisters deceased, age  75 secondary to heart  attack, age 4 secondary to ovarian cancer, age 70 brain tumor.  One brother  deceased as a fetus.  Seven brothers living ranging in age from 45 to 64,  one with a history of heart disease and the others health unknown.  Four  daughters living, age 29 - good health, age 27 - good health, age 17 - good  health, age 64 - good health.   REVIEW OF SYSTEMS:  Negative for epistaxis, oral lesion, hemoptysis,  dysphagia, hematemesis, vaginal bleeding, vaginal itching, melena,  hematochezia, edema of legs, weight loss, night sweats, etcetera.  Review of  systems is positive for episodic dizziness, episodic sneezing spells,  episodic runny nose, and episodic itchy eyes.   PHYSICAL EXAMINATION:  VITAL SIGNS:  Temperature of 100.3, pulse 83,  respirations 24, blood pressure 90/48.  GENERAL APPEARANCE:  Revealed an elderly, slightly short, medium framed,  alert, white female in no apparent respiratory distress.  HEENT:  Head normocephalic.  Ears normal auricles.  External canals patent.  Tympanic  membranes gray with decreased light reflex.  Eyes, lids negative  for ptosis.  Sclerae are white.  Pupils round, equally reactive to light.  Extraocular movements intact.  Nose, negative for discharge.  Mouth,  positive upper dentures.  Lower dentition fair.  No bleeding gums.  Posterior pharynx benign.  NECK:  Positive for enlarged left thyroid mass.  Supraclavicular space no  palpable nodes.  LUNGS:  Clear.  HEART:  Audible S1 S2 without murmur.  Rate 92 and regular.  BREASTS:  No skin changes.  No nodule on palpation.  Nipple erect without  discharge.  Positive for a sherry angioma on chest and abdomen.  ABDOMEN:  No distention.  Hyperactive bowel sounds.  Hypogastric area  positive for confluent diffuse erythema involving mons pubis area involving  both sides of groin with mild tenderness on palpation.  Positive for old  healed horizontal suprapubic surgical scar.  GENITOURINARY:  External genitalia normal female.  RECTAL:  No external lesions.  Digital exam positive stool in rectal vault.  No palpable rectal masses.  Stool guaiac negative.  EXTREMITIES:  Knees positive crepitus bilaterally.  No tenderness with  flexion/extension.  No swelling of knees.  Tibia no edema.  Feet, palpable  dorsalis pedis bilaterally.  NEUROLOGIC:  Alert and oriented to person, place and time.  Cranial nerves  II-XII appeared intact.  Negative for ataxia.   LABORATORY:  White count 14,800, hemoglobin 12.1, hematocrit 35.7, platelets  341,000.  Sodium 141, potassium 4.2, chloride 106, CO2 25, glucose 181, BUN  18, creatinine 1.2.  Total bilirubin 0.9, SGOT 38, SGPT 29, total protein  6.8, albumin 3.7, calcium 9.8.   PRIMARY IMPRESSION:  Acute pelvic dermal cellulitis.   SECONDARY DIAGNOSES:  1.  Type 2 diabetes mellitus.  2.  Idiopathic vertigo.  3.  Hyperlipidemia.  4.  Seasonal allergies.  5.  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  6.  Stress urinary incontinence.   PLAN:  1.  IV  fluids. 2.  IV antibiotics using Rocephin and doxycycline.  3.  Other meds:      1.  Zyrtec 10 mg p.o. every day.      2.  Lipitor 40 mg p.o. every day at bedtime.      3.  Metformin 500 mg p.o. every day.  4.  Accu-Chek b.i.d.  5.  Other labs:      1.  Hemoglobin A1c.      2.  Urinalysis.  3.  Fasting lipid profile.      4.  Chest x-ray.      5.  EKG.      6.  Urine culture/blood culture.      Annia Friendly. Loleta Chance, MD  Electronically Signed     GKH/MEDQ  D:  12/12/2004  T:  12/12/2004  Job:  [id number]

## 2010-07-20 NOTE — Op Note (Signed)
NAME:  Krista [name], Krista [name]                        ACCOUNT NO.:  [id number]   MEDICAL RECORD NO.:  [id number]                   PATIENT TYPE:  INP   LOCATION:  0445                                 FACILITY:  [facility]   PHYSICIAN:  De Blanch, M.D.         DATE OF BIRTH:  07-24-1931   DATE OF PROCEDURE:  11/01/2003  DATE OF DISCHARGE:                                 OPERATIVE REPORT   PREOPERATIVE DIAGNOSIS:  Stage IB1 squamous cell carcinoma of the cervix.   POSTOPERATIVE DIAGNOSIS:  Stage IB1 squamous cell carcinoma of the cervix.   PROCEDURES:  1.  Radical abdominal hysterectomy.  2.  Bilateral salpingo-oophorectomy.  3.  Pelvic lymphadenectomy.  4.  Placement of suprapubic catheter.   SURGEON:  Daniel L. Clarke-Pearson, M.D.   ASSISTANT:  1.  Roseanna Rainbow, M.D.  2.  Telford Nab, R.N.   ANESTHESIA:  General with oral tracheal tube.   ESTIMATED BLOOD LOSS:  550 mL.   SURGICAL FINDINGS:  At the time of exploratory laparotomy, the upper  abdomen, including liver, kidney spleen, stomach, omentum, small and large  bowel were normal.  There were no enlarged pelvic or periaortic lymph nodes.  Uterus is normal size.  The ovaries were unusually large for a woman of this  age with a large vascular blood supply.  There was a considerable amount of  thickening in the paracervical region, making the dissection of the ureteral  tunnel more difficult.   DESCRIPTION OF PROCEDURE:  The patient was brought to the operating room and  after satisfactory attainment of general anesthesia, was placed in the  modified lithotomy position in [place] stirrups.  The anterior abdominal wall,  perineum, and vagina were prepped with Betadine; a Foley catheter was  inserted and the patient draped.  The abdomen was opened through a  Pfannenstiel incision and the pelvis and abdomen explored with the above-  noted findings.  Bookwalter retractor was assembled, and the bowel was  packed  out of the pelvis.  The uterus was grasped with  long Kelly clamps.  The round ligaments were divided and the retroperitoneal space opened,  developing the paravesical and pararectal spaces.  The pelvic sidewall  vessels were identified as was the ureter.  The ovarian vessels were  skeletonized, clamped, cut, free-tied, and suture ligated.  With the  superior artery on traction, we dissected cephalad until the origin of the  uterine artery was identified.  This was doubly clipped and divided and the  distal portion identified by free-tie of 2-0 silk.  The ureter was mobilized  from the lateral attachments of the peritoneum along the uterosacral  ligament.  A similar procedure was performed on the opposite side of the  pelvis.  The rectovaginal space was then opened by incising the peritoneum  overlying the posterior pelvic cul-de-sac.  The uterosacral ligaments were  skeletonized, then divided using linear stapling devices.  The cardinal  ligaments were likewise divided  with linear stapling devices.  The bladder  flap was then advanced with sharp and blunt dissection off the cervix and  upper vagina.  The ureter was then dissected out of the paracervical tunnel  using Anderson clamps to dissect along the superior aspect of the ureter.  The anterior vesicouterine ligament was then clamped, cut, and suture  ligated using 2-0 Vicryl suture.  The ureter was then further mobilized  laterally and inferiorly until it was found to be inserting into the  bladder.  Similar procedure was performed on both sides.  The ureter was  further mobilized laterally, and the posterior vesicouterine ligament was  clamped, cut, and suture ligated.  The paravaginal tissues and additional  cardinal ligament were clamped, cut, and suture ligated.  It should be noted  that throughout the dissection of the ureter, a considerable amount of  fibrosis was encountered bilaterally, resulting in bleeding from  paracervical  and paravaginal veins.  The vaginal angles were then  identified, clamped, cut, and the vagina transected.  The uterus, cervix,  and upper vagina were inspected, and an adequate vaginal cuff was found.  The vaginal angles were transfixed with 0 Vicryl suture and the central  portion of the vagina closed with interrupted figure-of-eight sutures of 0  Vicryl.  The peritoneum of the posterior cul-de-sac, rectovaginal space  tissue, and vaginal angle and peritoneum of the bladder flap were then  reapproximated with a row of interrupted sutures of 0 Vicryl.  At this  juncture, attention was turned to performing a pelvic lymphadenectomy.  All  of the lymph nodes of the pelvis overlying the external iliac artery and  vein, internal iliac artery, and obturator space were excised as a single  specimen.  Care was taken to avoid injury to the vessels and ureter.  Hemostasis found to be excellent.  Packs were placed in the pelvis, then  reinspected.  Additional bleeding from the left vaginal angle was controlled  with figure-of-eight suture of 2-0 Vicryl.  Pelvis was then irrigated and  found to be hemostatic.   The retropubic space was opened and the dome of the bladder incised.  A  suprapubic catheter was then brought through a stab incision in the midline  of the suprapubic region and placed in the bladder.  The bladder was then  closed with two layers of 2-0 Vicryl, the first being a through-and-through  closure, the second being an imbricating stitch.  Pelvis was reinspected and  found to be hemostatic.  Packs and retractors were removed.  The anterior  abdominal wall was then closed in layers, the first being a running 2-0  Vicryl suture on the peritoneum.  The subfascial region was irrigated and  found to be hemostatic.  Fascia was then closed with a running suture of 0  PDS.  The subcutaneous tissue was then irrigated, hemostasis achieved with cautery, skin closed with skin staples.  A  dressing was applied.  The  suprapubic catheter was then secured to the skin with a single stitch of 2-0  silk.  The patient was awakened from anesthesia and taken to the recovery  room in satisfactory condition.  Sponge, needle, and instrument counts  correct x 2.                                               De Blanch, M.D.  DC/MEDQ  D:  11/01/2003  T:  11/01/2003  Job:  [id number]   cc:   Roseanna Rainbow, M.D.   [name], M.D.  [address] [place]  [place] [address]  Fax: [phone]   Telford Nab, R.N.  [phone] [address]  [place], [place] [address]

## 2010-07-20 NOTE — Consult Note (Signed)
NAME:  Krista [name], Krista [name]              ACCOUNT NO.:  [id number]   MEDICAL RECORD NO.:  [id number]          PATIENT TYPE:  OUT   LOCATION:  GYN                          FACILITY:  [facility]   PHYSICIAN:  Paola A. Duard Brady, MD    DATE OF BIRTH:  [birth year]   DATE OF CONSULTATION:  DATE OF DISCHARGE:                                   CONSULTATION   Krista [name] is a 77 year old who underwent a radical hysterectomy in August,  2005 for a stage IB cervical carcinoma.  Her pathology was consistent with  an invasive, moderately differentiated squamous cell carcinoma which was  deeply invasive to within 1 mm up the cervical serosa.  There was  involvement extensions of the lower uterine segment.  All lymph nodes were  free of disease.  She had a fairly uncomplicated postoperative course.  Because of her intermediate risk features, it was determined that she would  benefit from postoperative radiation therapy.  She subsequently underwent  radiation therapy under the care of Dr. Roselind Messier.  She completed her radiation  therapy in November, 2005.  She tolerated it fairly well.  She had an exam  by Dr. Roselind Messier in December, 2005, at which time there were no areas of  inflammation or irritation.  There were some radiation changes in the  proximal vagina but no masses appreciated.  She comes in today for her  interval evaluation.  She is overall doing quite well.  She does complain of  some urinary leakage.  It is primarily in the evening and from noon to  evening, she does void about every two hours.  She states that she leaks  sometimes when she stands up from using the bathroom.  She leaks with  laughing or coughing or leaks on the way to the bathroom, so she seems to  have a component of either intrinsic sphincter deficiency versus pure urge  or mixed urge and stress urinary incontinence.  She states that it has  improved somewhat since she completed her radiation therapy.  She denies any  change in her bowels.   She denies any vaginal bleeding.   REVIEW OF SYSTEMS:  As above.  Her appetite is excellent.  She denies any  chest pain, shortness of breath, nausea, vomiting, fevers, chills,  headaches, or visual changes.   MEDICATIONS:  Include Metformin, Lipitor, meclizine, aspirin, and a bladder  pill.   PHYSICAL EXAMINATION:  VITAL SIGNS:  Weight 122 pounds.  Blood pressure  108/68.  GENERAL:  A well-developed and well-nourished female in no acute distress.  NECK:  Supple.  There is no lymphadenopathy.  No thyromegaly.  LUNGS:  Clear to auscultation bilaterally.  CARDIOVASCULAR:  Regular rate and rhythm.  ABDOMEN:  She has a well-healed transverse skin incision.  Abdomen is soft,  nontender and nondistended.  There are no palpable masses or  hepatosplenomegaly.  Groins are negative for adenopathy.  PELVIC:  External genitalia is within normal limits and markedly atrophic.  The vagina is atrophic.  The vaginal cuff is visualized.  There are some  post-radiation telangiectasia-type changes at the top of the  vagina.  There  are no gross visible lesions.  ThinPrep Pap was submitted without  difficulty.  Bimanual examination reveals no masses or nodularity.  Rectal  confirms.   ASSESSMENT:  A 77 year old with stage IB moderately differentiated squamous  cell carcinoma. Secondary to intermediate risk factors, underwent  postoperative radiation therapy.   PLAN:  1.  Follow up the results of her Pap smear from today.  2.  With regards to her urinary incontinence, I would give her more time to      continue healing from her surgery and her radiation therapy.  She has      noticed an improvement since she has completed her radiation therapy,      and I would continue to monitor this.  She was instructed to call Telford Nab in a few months.  If she continues to have significant issues,      we will refer her to a urologist for urodynamic testing.  3.  She will see Dr. Roselind Messier in three months  and return to see [place] in six      months.      PAG/MEDQ  D:  05/15/2004  T:  05/15/2004  Job:  [id number]   cc:   Telford Nab, R.N.  [address]  [place], [place] [address]   Billie Lade, M.D.  [address] - [facility]  [place]  [place] [address]  Fax: [phone]   [name], M.D.  [address] [place]  [place] [address]  Fax: [phone]

## 2010-07-20 NOTE — H&P (Signed)
[facility]  Patient:    Krista [name], Krista [name] Visit Number: [id number] MRN: [id number]          Service Type: OUT Location: RAD Attending Physician:  Evlyn Courier Dictated by:   Elpidio Anis, M.D. Admit Date:  12/24/2000 Discharge Date: 12/24/2000                           History and Physical  HISTORY OF PRESENT ILLNESS:  [age]-year-old female with history of postprandial abdominal pain, increased bloating, fecal urgency with positive stool guaiac.  The patient is status post cholecystectomy.  She is scheduled for upper and lower endoscopy.  MEDICATIONS:  No chronic medications.  ALLERGIES: SULFA drugs.  PAST SURGICAL HISTORY:  Cholecystectomy.  PHYSICAL EXAMINATION: VITAL SIGNS:  Blood pressure 140/78, pulse 70, respirations 16, weight 140 pounds.  HEENT:  Supple.  No JVD or bruit, no adenopathy or thyromegaly.  CHEST:  Clear to auscultation.  HEART:  Regular rate and rhythm without murmur, rub or gallop.  ABDOMEN:  Moderate left lower quadrant and hypogastric tenderness and hyperactive bowel sounds.  RECTAL:  Normal.  Stool is guaiac-positive.  EXTREMITIES:  Unremarkable.  No cyanosis, clubbing or edema.  No joint deformity.  NEUROLOGICAL:  No motor, sensory or cerebellar deficit.  IMPRESSION: 1. Change in bowel habits with guaiac-positive stool. 2. Recurrent abdominal pain.  PLAN:  Upper and lower endoscopy. Dictated by:   Elpidio Anis, M.D. Attending Physician:  Evlyn Courier DD:  01/20/01 TD:  01/20/01 Job: [id number]

## 2010-07-20 NOTE — Discharge Summary (Signed)
NAME:  Krista [name], Krista [name]              ACCOUNT NO.:  [id number]   MEDICAL RECORD NO.:  [id number]          PATIENT TYPE:  INP   LOCATION:  A331                          FACILITY:  [facility]   PHYSICIAN:  Annia Friendly. Loleta Chance, MD     DATE OF BIRTH:  August 13, 1931   DATE OF ADMISSION:  12/12/2004  DATE OF DISCHARGE:  10/15/2006LH                                 DISCHARGE SUMMARY   The patient was a 77 year old gravida 5, para 5, AB 0 white female widow  from [place], [place].  Chief complaints were nausea, vomiting,  dizziness and redness of hypogastric area.  The patient has been  experiencing symptoms less than 12 hours before admission.  The patient  experienced vomiting x3.  Vomitus was initially food stuff.  Dizziness was  described as room spinning around.  History is positive here for dizziness  off and on since July of 2006, with negative CT of the head on May 06, 2003, and September 25, 2004.  Each time CT of the head demonstrated no evidence  of intracranial abnormality.  The patient denied earache, double vision,  sore throat, dysuria, gross hematuria, diarrhea, etc.  The patient had some  runny nose (clear), frontal headache and intermittent productive cough.  Sputum is described as white.  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   PAST MEDICAL HISTORY:  Positive for:  1.  Hospitalization with pregnancy.  2.  Radical hysterectomy at [facility] on July 02, 2003,      secondary to stage 1B1 squamous cell carcinoma of the cervix.  3.  Laparoscopic cholecystectomy in 1994.   THE PATIENT WAS ALLERGIC TO SULFA DRUGS.   FAMILY HISTORY:  Revealed mother deceased at age 71 secondary to  complication of stroke; father deceased in his 54s secondary to complication  of stroke; 4 sisters living, age 56 with history of obesity, age 23, history  of diabetes mellitus, age  6 good health, age 71 with history of diabetes  mellitus and breast cancer; 3 sisters deceased age 35 secondary to heart  attack, age 27 secondary to ovarian cancer, age 49 secondary to brain tumor;  1 brother deceased as a fetus; 7 brothers living, ranging in age from 65 to  57 (one with history of heart disease and the other's health unknown); 4  daughters living, age 30 good health, age 69 good health, age 80 good  health, and age 14 good health.   PROBLEM #1:  Acute hypogastric cellulitis.   General appearance revealed an elderly, slightly short, medium-framed, alert  white female in no apparent respiratory distress.  VITALS ON ADMISSION WERE AS FOLLOWS:  Temperature 100.3, pulse 83,  respirations 24, blood pressure 90/48.  LUNGS:  Clear.  HEART EXAM:  Revealed audible S1 and S2 without murmur.  Rhythm was regular  and rate within normal limits.  ABDOMEN:  Demonstrated no distention.  Hypogastric area was positive for  confluent diffuse erythema involving mons pubis area and both sides of groin  with mild tenderness on  palpation.  Hypogastric area also revealed old  healed horizontal  suprapubic surgical scar.  EXTERNAL GENITALIA:  Normal female.  NEUROLOGIC:  The patient was alert and oriented to person, place and time.  Cranial nerves II through XII appeared intact.   SIGNIFICANT LABS ON ADMISSION WERE AS FOLLOWS:  White count 14,800,  hemoglobin 12.1, hematocrit 35.7, platelets 341,000.  Erythrocyte  sedimentation rate 45, sodium 141, potassium 4.2, chloride 106, CO2 25, BUN  18, creatinine 1.2.  Calcium 9.8, total protein 6.8, albumin 3.7, AST 38,  ALT 29, ALP 109, total bilirubin 0.9.  Chest x-ray on admission reveals  possible nodule involving lung apices, as read by Dr. Gaylyn Rong on  December 12, 2004.  The patient was treated with IV fluids, IV antibiotic  using Rocephin and doxycycline IV, ordered blood culture, urine culture and  other supportive measures.   Urinalysis on admission was essentially normal.  CT of the abdomen and pelvis on December 13, 2004, revealed the following:  1.  Right renal cyst and no acute abnormality of the abdomen.  2.  Hysterectomy, oophorectomy and bilateral pelvic lymph node dissection.  3.  Thickening of the bowel wall or small bowel loops in mid to lower      pelvis, question radiation enteritis, though small bowel wall thickening      may also be seen with inflammatory bowel disease, infectious etiology      and ischemia.  A small amount of fluid was present at the proximal and      inguinal canals.  Minimal skin thickening and subcutaneous infiltrative      change in anterior abdominal wall of pelvis, questionable cellulitis      read by Dr. Ulyses Southward.   Patient hospital course was uneventful.  White count decreased to 9900 on  December 13, 2004, and 5500 on December 14, 2004.  Patient experienced  resolution of redness and tenderness of hypogastric area at time of  discharge.  She was discharged to her home on December 16, 2004.  Blood  culture was negative for growth.  Urine culture was also negative for  growth.  Etiology of cellulitis is unclear.   PROBLEM #2:  Type 2 diabetes mellitus.  Serum glucose on admission was 181.  Hemoglobin A1c was 7.4% (normal 4.6 to 6.1%).  Patient was treated with diet  and metformin initially 500 mg p.o. daily.  However, metformin was  discharged because of IV contrast used.  Metformin was stopped to avoid  renal failure and damage.  The patient had repeat serum glucose on December 15, 2004, at 1610 revealing a value of 134.  The patient will be restarted  on metformin approximately 2 days after discharge.   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  Examination of  neck demonstrated enlarged left thyroid mass.  Patient had biopsy to confirm presence of papillary carcinoma.  She has been evaluated by endocrinologist,  Dr. Della Goo.  The patient had  been scheduled for surgery; however, it  was canceled due to her cellulitis.  She will be rescheduled to see surgeon  as outpatient pertaining to elective thyroid surgery.  The patient had no  complaint of swallowing during this hospitalization.   PROBLEM #4:  Idiopathic vertigo.  Patient has experienced dizziness off and  on for some time.  Dizziness has been present since July, 2006, with  negative CT of the head on May 06, 2003, and September 25, 2004.  She has been  on meclizine as needed.  Because of history of SEASONAL ALLERGY, the patient  was started during this hospitalization on Zyrtec 10 mg p.o. every day.  She  experienced resolution of dizziness before discharge.  She was observed to  be ambulatory in room without sign of ataxia.   PROBLEM #5:  Hyperlipidemia.  Patient was continued on Lipitor 40 mg p.o.  every day.  A fasting lipid profile during this hospitalization demonstrated  to the following:  Total cholesterol 127 mg/dl, triglyceride 65 mg/dl, ACL  50 mg/dl, LDL 64 mg/dl.  She had no complaint of muscle ache or soreness  during this hospitalization.   PROBLEM #6:  Nodularity in the lung apices of unclear etiology.  Chest x-ray  on December 12, 2004, demonstrated possible nodule in lung apices as read by  Dr. Gaylyn Rong.  Dr. Ova Freshwater recommended apical lordotic view for  further catheterization.  Chest x-ray was done on December 13, 2004, as  recommended by radiology, and it revealed a persistent 1.4 x 0.9 cm nodular  density at right lung apex.  A CT of the chest with contrast was recommended  for further evaluation by Dr. Jeannett Senior __________.  CT of the chest on  December 15, 2004, was read as no acute pulmonary finding.  There were patchy  areas of subpleural atelectasis and scarring changes.  There were also areas  of nodularity in the lung apices.  Radiologist felt this may be inflammatory  or scarring change.  At this point, the radiologist recommended a  followup  noncontrast CT of the chest in 4 months to re-evaluate these lesions.  The  patient had no complaint of chest pain, hemoptysis, lesion of chest wall,  shortness of breath during this hospitalization.  Moreover, no mediastinal  or hilar adenopathy was present.   PROBLEM #7:  Status post hysterectomy secondary to stage 1B1 squamous cell  carcinoma of the cervix.  The patient has received radiation therapy as a  part of treatment of her cervical cancer.  She is followed by the local  gynecologist pertaining to this matter, as well as oncology.  She has no  complaint of vaginal bleeding, vaginal discharge, vaginal itching or pain  pertaining to genitalia.  She has not lost weight.  CT of the pelvis during  this hospitalization demonstrated no reoccurrence of disease thus far.   INSTRUCTIONS AT THE TIME OF DISCHARGE:  Diet:  Carbohydrate modified 1600 calorie low sodium and cholesterol.  Activity:  As tolerated.  Medications:  1.  Zyrtec 10 mg one tablet every day.  2.  Lipitor, generic, 40 mg one tab every bedtime.  3.  Doxycycline 100 mg one tablet every 12 hours.  4.  Metformin 500 mg one tablet every day (do not start until December 18, 2004).  5.  Accu-Chek every day.   Followup:  At office with Dr. Maggie Schwalbe on December 27, 2004.   FINAL PRIMARY DIAGNOSIS:  Acute hypogastric abdominal wall cellulitis.   SECONDARY DIAGNOSIS:  1.  Type 2 diabetes mellitus.  2.  Idiopathic vertigo.  3.  Hyperlipidemia.  4.  SEASONAL ALLERGIES.  5.  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  6.  Stress urinary incontinence.   ADDENDUM:  Patient will continue on Detrol LA 4 mg p.o. every day pertaining  to this problem.  The patient did not complain of inability to hold urine  during this hospitalization.      Annia Friendly. Loleta Chance, MD  Electronically  Signed     GKH/MEDQ  D:  12/22/2004  T:  12/23/2004  Job:  [id number]

## 2010-07-20 NOTE — Discharge Summary (Signed)
NAME:  Krista [name], Krista [name]              ACCOUNT NO.:  [id number]   MEDICAL RECORD NO.:  [id number]          PATIENT TYPE:  INP   LOCATION:  A302                          FACILITY:  [facility]   PHYSICIAN:  Tesfaye D. Felecia Shelling, MD   DATE OF BIRTH:  1931/03/20   DATE OF ADMISSION:  04/12/2006  DATE OF DISCHARGE:  02/12/2008LH                               DISCHARGE SUMMARY   DISCHARGE DIAGNOSES:  1. Pelvic cellulitis.  2. Hypothyroidism.  3. History of carcinoma of the cervix.  4. Diabetes mellitus.   DISCHARGE MEDICATIONS:  1. Augmentin 500 mg one tablet p.o. b.i.d. 5 days.  2. Lipitor 40 mg daily.  3. Synthroid 100 mcg daily.  4. Metformin 500 mg b.i.d.  5. Aspirin 81 mg p.o. daily.   DISPOSITION:  Patient will be discharged home in stable condition.   HOSPITAL COURSE:  This is a 77 year old female patient who was admitted  to [facility] due to painful redness on her perineal area.  Patient had also low-grade fever and chills.  Patient was admitted as a  possible case of pelvic cellulitis.  She was treated with IV  antibiotics.  The patient improved.  She was discharged home to continue  her regular treatment.      Tesfaye D. Felecia Shelling, MD  Electronically Signed     TDF/MEDQ  D:  05/13/2006  T:  05/15/2006  Job:  [id number]

## 2010-07-20 NOTE — Consult Note (Signed)
NAME:  Krista [name], Krista [name] NO.:  [id number]   MEDICAL RECORD NO.:  [id number]                   PATIENT TYPE:  OUT   LOCATION:  GYN                                  FACILITY:  [facility]   PHYSICIAN:  De Blanch, M.D.         DATE OF BIRTH:  09/12/1931   DATE OF CONSULTATION:  10/04/2003  DATE OF DISCHARGE:                                   CONSULTATION   A 77 year old white female now returns for followup to discuss management of  a newly diagnosed cervical cancer.  Her LEEP specimen has been reviewed by  Dr. Quentin Ore, and she has reported that the largest area of cancer on one side  measures 0.6 cm (6 mm) in greatest dimension.  There are multiple surgical  margins involved.  Endocervical curettage is also positive.   Discussed these findings with the patient and her two daughters.  It appears  that she has a stage IBI squamous cell carcinoma of the cervix.  We  discussed management options, including radiation therapy or radical  hysterectomy with pelvic lymphadenectomy.  The pro's and con's of each were  discussed at length as well as the risks.  The patient strongly desires to  go on ahead the surgical approach.  She is aware of the risks of the  surgery, including hemorrhage, infection, injury to adjacent viscera,  thromboembolic complications, and anesthetic risks.  She further understands  that bladder dysfunction is common after this extensive operation, and she  will have a suprapubic catheter and require intermittent clamp and release  regimen postoperatively until her bladder function returns.   All other questions are answered.  We will schedule surgery in the next  several weeks in conjunction with Dr. Antionette Char.  All other  questions are answered.                                               De Blanch, M.D.    DC/MEDQ  D:  10/04/2003  T:  10/04/2003  Job:  [id number]   cc:   [name], M.D.  [address] [place]  [place] [address]  Fax: [phone]   Telford Nab, R.N.  [address]  [place], [place] [address]

## 2010-07-20 NOTE — H&P (Signed)
NAME:  Krista [name], Krista [name]              ACCOUNT NO.:  [id number]   MEDICAL RECORD NO.:  [id number]          PATIENT TYPE:  INP   LOCATION:  A320                          FACILITY:  [facility]   PHYSICIAN:  Tesfaye D. Felecia Shelling, MD   DATE OF BIRTH:  Jan 07, 1932   DATE OF ADMISSION:  02/13/2006  DATE OF DISCHARGE:  LH                              HISTORY & PHYSICAL   CHIEF COMPLAINT:  Nausea, vomiting and abdominal discomfort of 3 days'  duration.   HISTORY OF PRESENT ILLNESS:  This is a 77 year old white female with  history of multiple medical illnesses, who came to the office with the  above complaints.  After the patient was briefly evaluated in the  office, she was sent to the emergency room for further evaluation.  The  patient had a CT scan of the abdomen in the emergency room which showed  subacute obstruction of small bowel.  The patient was then started on IV  fluid and then she was admitted for further treatment.  The patient does  have a history of cervical cancer and is status post surgery and  radiation treatment.  The patient was briefly seen by Dr. Lovell Sheehan and he  advised to continue medical treatment.  The patient has had no nausea or  vomiting since admission.  She feels much better.   REVIEW OF SYSTEMS:  No fever, chills, cough, chest pain, shortness of  breath, palpitation, dysuria, urgency or frequency of urination.   PAST MEDICAL HISTORY:  1. Diabetes mellitus, type 2.  2. History of CA of the cervix and status post surgery and radiation.  3. History of CA of the thyroid and status post thyroidectomy.  4. History of cellulitis of the pubic area.   CURRENT MEDICATIONS:  1. Metformin 1000 mg p.o. daily.  2. Lipitor 40 mg daily.  3. Aspirin 81 mg daily.  4. Synthroid 0.125 mg daily.  5. Ibuprofen as needed.   SOCIAL HISTORY:  The patient has no history of alcohol, tobacco or  substance abuse.   PHYSICAL EXAMINATION:  GENERAL:  The patient is alert, awake and not in  any  distress.  VITALS:  Blood pressure 144/72, pulse 76, respiratory rate 18,  temperature 98.3 degrees Fahrenheit.  HEENT:  Pupils are equal and reactive.  NECK:  Supple.  CHEST:  Fair air entry.  Clear lung fields.  CARDIOVASCULAR:  First and 2nd heart sounds heard.  No murmur.  No  gallop.  ABDOMEN:  Soft and relaxed.  Bowel sounds are positive.  No masses.  No  organomegaly.  EXTREMITIES:  No leg edema.   LABORATORIES ON ADMISSION:  CBC:  WBC 4.5, hemoglobin 13.2, hematocrit  39.2, platelets 296,000.  Sodium 138, potassium 4.0, chloride 105,  calcium 8.7, carbon dioxide 24, glucose 131, BUN 19, creatinine 0.9.   ASSESSMENT:  1. Subacute bowel obstruction, probably secondary to adhesion probably      from previous radiotherapy.  2. Diabetes mellitus, type 2.  3. Hypothyroidism.   PLAN:  We will keep the patient n.p.o.  We will continue IV fluids.  We  will give the  patient Zofran 4 mg IV q.6 h.  We will do a surgical  consult.      Tesfaye D. Felecia Shelling, MD  Electronically Signed     TDF/MEDQ  D:  02/14/2006  T:  02/14/2006  Job:  [id number]

## 2010-07-20 NOTE — Consult Note (Signed)
NAME:  Krista [name], Krista [name]              ACCOUNT NO.:  [id number]   MEDICAL RECORD NO.:  [id number]          PATIENT TYPE:  OUT   LOCATION:  GYN                          FACILITY:  [facility]   PHYSICIAN:  De Blanch, M.D.DATE OF BIRTH:  29-Jun-1931   DATE OF CONSULTATION:  02/01/2005  DATE OF DISCHARGE:                                   CONSULTATION   CHIEF COMPLAINT:  Cervical cancer stage IB1.   INTERVAL HISTORY:  The patient returns today for continuing follow-up  without any new symptoms. She continues to have lack of bladder sensation  and does not really know when she is going to void. She voids approximately  every 2 hours by the clock. She denies any pelvic pain, pressure, vaginal  bleeding or discharge.   Recently the patient has undergone a thyroidectomy for what she claims is  thyroid cancer. Further therapy is pending.   PAST MEDICAL HISTORY:  Medical illnesses -  diabetes, hypercholesterolemia,  vertigo.   CURRENT MEDICATIONS:  Metformin, Lipitor, meclizine.   PAST SURGICAL HISTORY:  Laparoscopic cholecystectomy in 1994, D&C, radical  hysterectomy.   FAMILY HISTORY:  Sister with breast cancer. Another sister with ovarian  cancer. The patient is widowed. She is a [employer] trader. She comes accompanied by  one of her daughters today. She is a past smoker.   OBSTETRICAL HISTORY:  Gravida 4.   REVIEW OF SYSTEMS:  A 10-point comprehensive review of systems is negative  except as noted above.   PHYSICAL EXAM:  VITAL SIGNS:  Weight 133 pounds, blood pressure 120/60,  pulse 80, respiratory rate 20.  GENERAL:  The patient is a healthy white female in no acute distress.  HEENT is negative.  NECK:  Supple without thyromegaly. There is no supraclavicular or inguinal  adenopathy.  ABDOMEN:  The abdomen is soft, nontender. No mass, organomegaly, ascites or  hernias noted.  PELVIC:  EGBUS, vagina, bladder and urethra are normal, slightly atrophic.  Bimanual and rectovaginal  exam reveal no masses, induration or nodularity.   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   PLAN:  Pap smears are obtained. The patient is encouraged to continue to  void by the clock. She will return to see Dr. Roselind Messier in 4 months and return  to see [place] in 8 months.      De Blanch, M.D.  Electronically Signed     DC/MEDQ  D:  02/01/2005  T:  02/01/2005  Job:  [id number]   cc:   Billie Lade, M.D.  Fax: [phone]   [name], M.D.  Fax: [phone]   Annia Friendly. Loleta Chance, MD  Fax: [phone]   Telford Nab, R.N.  [address]  [place], [place] [address]

## 2010-07-20 NOTE — Discharge Summary (Signed)
NAME:  Krista [name], Krista [name]              ACCOUNT NO.:  [id number]   MEDICAL RECORD NO.:  [id number]          PATIENT TYPE:  INP   LOCATION:  A320                          FACILITY:  [facility]   PHYSICIAN:  Tesfaye D. Felecia Shelling, MD   DATE OF BIRTH:  Jul 10, 1931   DATE OF ADMISSION:  02/13/2006  DATE OF DISCHARGE:  12/17/2007LH                               DISCHARGE SUMMARY   DISCHARGE DIAGNOSES:  1. Subacute bowel obstruction.  2. Nausea and vomiting secondary to above.  3. Diabetes mellitus.  4. Hypertension.  5. Hypothyroidism.  6. History of cholecystectomy.  7. History of cancer of the thyroid.  8. History of cancer of the cervix and is status post surgery and      radiation.   DISCHARGE MEDICATIONS:  1. Metformin 1000 mg p.o. daily.  2. Lipitor 40 mg daily.  3. Aspirin 81 mg daily.  4. Synthroid 0.125 mg daily.   DISPOSITION:  The patient was discharged to home in stable condition.   HOSPITAL COURSE:  This is a 77 year old female patient with a history of  multiple medical illnesses who was admitted due to nausea, vomiting, and  abdominal pain.  She was evaluated in the emergency room and was found  to have subacute bowel obstruction.  Surgical and GI consult was done.  The patient was treated medically.  Her symptoms gradually resolved.  She was able to tolerate a regular diet.  The patient was discharged  home to continue her medications.      Tesfaye D. Felecia Shelling, MD  Electronically Signed     TDF/MEDQ  D:  03/17/2006  T:  03/17/2006  Job:  [id number]

## 2010-07-20 NOTE — Op Note (Signed)
NAME:  Krista [name], Krista [name]              ACCOUNT NO.:  [id number]   MEDICAL RECORD NO.:  [id number]          PATIENT TYPE:  INP   LOCATION:  A309                          FACILITY:  [facility]   PHYSICIAN:  Dirk Dress. Katrinka Blazing, M.D.   DATE OF BIRTH:  02-16-1932   DATE OF PROCEDURE:  01/04/2005  DATE OF DISCHARGE:                                 OPERATIVE REPORT   PREOPERATIVE DIAGNOSIS:  Papillary carcinoma of the thyroid.   POSTOPERATIVE DIAGNOSIS:  Papillary carcinoma of the thyroid.   PROCEDURE:  Near total thyroidectomy.   SURGEON:  Dr. Katrinka Blazing and Dr. Malvin Johns.   DESCRIPTION:  Under general anesthesia, the patient's neck was prepped and  draped in a sterile field. Standard low collar incision was made and  extended through the platysma. Superior and inferior skin flaps were  developed in the thyroid. Lahey thyroid retractor was placed. The strap  muscles were opened in the midline. Initially, it was started to dissect the  left lobe of the gland. The tumor invaded the sternal thyroid muscle, and it  could not be separated from the gland. It was therefore sacrificed along  with the gland. There was extensive desmoplastic reaction involving the  entire left lobe of the gland. The interior pole showed what looked like two  lymph nodes that were dissected and were included with the specimen. The  pedicle was dissected, double tied with 2-0 silk and divided. Lateral  dissection of the muscle was carried out down to the carotid sheath. We then  tried to work more medial, taking care to stay out of the tracheal  esophageal groove. Because of the extensive amount of reaction in this area,  it was elected to approach the gland from the superior aspect. Superior pole  was dissected. It was encircled with two ligatures of 2-0 silk and then  divided. Dissection behind the gland was carried out down to the laryngeal  muscle. Careful dissection was carried out to try to find the recurrent  laryngeal nerve.  However, it was never identified. It was therefore elected  to stay high on the gland, and small ___________ sheath was dissected, and  all of this tissue was then pushed more inferiorly until the base of the  gland could be identified. Once this was done, the base of the gland was  then bluntly dissected, and the fibrous tissue that was encountered was  clamped high on the gland to try to preserve at least one parathyroid on  this side. Because of the extensive reaction, I cannot say that a  parathyroid was preserved on the left side. The gland was dissected down to  the trachea, and then using small mosquito clamps, it was separated from the  trachea. There was intermittent association of the tumor with the trachea,  or there was just extensive desmoplastic reaction. There did not appear to  be invasion of the trachea. Dissection was continued across to the midline  with removal of the isthmus. Next, the right lobe of the gland was  dissected. Strap muscles were separated without difficulty. The gland was  atrophic though it  appeared to be very rubbery and fibrotic. The inferior  superior poles were dissected, controlled with ligatures of 2-0 silk, and  divided. Superior laryngeal nerve was identified and was preserved.  Posterior dissection of the gland was carried out, and it was elected to be  a little more conservative on the right side. Staying high on the trachea,  mosquito clamps were used to clamp the gland and then divide it. Very small  remnant of thyroid tissue which was less than 1 cm from the vascular pedicle  was left in place. This was done to assure that the parathyroids were not  devascularized and that the recurrent laryngeal on the right was not  compromised. The gland was marked for the pathologist and then passed off as  a specimen. Hemostasis was achieved. Blood loss was less than 50 cc. JP  drain was placed. Strap muscles were then closed in the midline with 3-0   Monocryl. Platysmas was closed with 3-0 Monocryl. Skin was closed with 4-0  Vicryl. The drain was secured with 3-0 nylon. Drain was hooked to suction.  OpSite dressing was placed. The patient tolerated the procedure well. She  was slowly awakened from anesthesia. She was extubated before she was  completely awake. Evaluation of her cords revealed that opened and closed  without difficulty. She had easy respirations. She was allowed to become  fully awake. She was able to speak without difficulty. She was transferred  to a bed and taken to the post-anesthesia care unit for further monitoring.      Dirk Dress. Katrinka Blazing, M.D.  Electronically Signed     LCS/MEDQ  D:  01/04/2005  T:  01/04/2005  Job:  [id number]   cc:   Alan Mulder, M.D.  Fax: [phone]

## 2010-07-20 NOTE — Consult Note (Signed)
NAME:  Krista [name], Krista [name]              ACCOUNT NO.:  [id number]   MEDICAL RECORD NO.:  [id number]          PATIENT TYPE:  INP   LOCATION:  A320                          FACILITY:  [facility]   PHYSICIAN:  Kassie Mends, M.D.      DATE OF BIRTH:  September 16, 1931   DATE OF CONSULTATION:  02/15/2006  DATE OF DISCHARGE:                                 CONSULTATION   REASON FOR CONSULTATION:  Nausea, vomiting, and dilated small bowel  segment with small bowel wall thickening.   HISTORY OF PRESENT ILLNESS:  Krista [name] is a 77 year old female with a  significant past medical history of cervical carcinoma, requiring  surgery and radiation therapy in 2005, which was completed in November  of 2005. She had subsequent acute pelvic dermal cellulitis, beginning in  October of 2006. She was managed chronically for this problem into 2007.  She had a CT scan in October 2006, which showed thickened small bowel  loops and the differential diagnosis was radiation enteritis versus  inflammatory bowel disease, versus infection, versus ischemia. She had a  repeat CT scan in February 2007, which did not comment on any small  bowel findings. She reports feeling well over the last year except for 2  episodes within the past 2 months, in which she had some lower abdominal  pain associated with nausea but no vomiting. Her symptoms lasted  approximately half a day. She states her stomach may hurt when she has  to use the bathroom. On Tuesday, around 10:30 or 11:00 a.m., she began  to have pain in the middle of her abdomen. Initially, it was not  associated with nausea. Approximately 2 hours later, she had nausea and  then she began to vomit off and on until around 5:30 p.m. On Wednesday,  she continued to vomit. She was unable to keep down liquids or chicken  noodle soup. On Thursday, she was seen and evaluated in your office and  sent to the emergency department. She was still unable to keep down even  Jello. In the emergency  department, her temperature was 98.3, blood  pressure 144/72, and her pulse was 76. Her white count was normal and  her UA was remarkable only for trace ketones. She had an acute abdominal  series that showed prominent small bowel loops with air fluid levels in  the mid abdomen. CT scan of the abdomen was obtained, which showed stool  in the colon. She had dilated small bowel loops in the right upper  pelvis, showing mildly thickened wall. Since admission, her last episode  of vomiting was Wednesday. She still has some nausea when she thinks  about episodes of eating. She denies any change in her appetite. She  denies any fever or diarrhea. She has increased frequency in her stools  but still had formed stool. She denies any diarrhea. She denies any  blood in her stool or weight loss. She denies any dysuria or hematuria.  She has had a decreased energy level since starting on Synthroid.   PAST MEDICAL HISTORY:  1. Diabetes for 4 years.  2. Hypothyroidism.  3.  Arthritis.   PAST SURGICAL HISTORY:  1. Cholecystectomy in 1994.  2. Thyroidectomy secondary to thyroid cancer.   FAMILY HISTORY:  Her sister died of colon cancer at age 50. She denies  any family history of inflammatory bowel disease or colon polyp.   ALLERGIES:  SULFA.   MEDICATIONS:  1. Insulin sliding scale.  2. Synthroid.  3. Dilaudid p.r.n., no doses since admission.  4. Zofran p.r.n., last dose February 13, 2006.   HOME MEDICATIONS:  1. Metformin.  2. Lipitor.  3. Aspirin 81 mg daily.  4. Synthroid.  5. Ibuprofen less than once a week.   SOCIAL HISTORY:  She denies any tobacco or alcohol use. She used to  smoke 1/2 per day for at least 10 years. She quit 25 years ago. She used  to work in tobacco. She has 4 children, 3 live in [place] and 1 lives  in [place].   REVIEW OF SYSTEMS:  She had an upper endoscopy in 2002, which showed  gastritis. It was performed by Dr. Katrinka Blazing. She also had a colonoscopy in   2002, which showed diverticulosis. Her review of systems per the HPI,  otherwise, all systems are negative.   PHYSICAL EXAMINATION:  VITAL SIGNS:  Temperature 98.2, blood pressure  143/74, pulse 51, respiratory rate 18. Finger stick 144.GENERAL:  No  acute distress. Alert and oriented times four.HEENT:  Pupils are  reactive to light. The left pupil is somewhat deformed. Mouth has no  oral lesions. Posterior pharynx without erythema or exudate.NECK:  Full  range of motion. No lymphadenopathy.LUNGS:  Clear to auscultation  bilaterally.CARDIOVASCULAR:  Regular rate and rhythm. No murmur. Normal  S1 and S2.  ABDOMEN:  Bowel sounds present. Soft, nontender, and nondistended. No  rebound or guarding.EXTREMITIES:  Without clubbing, cyanosis, or edema.  NEUROLOGIC:  No focal neurologic deficits.   LABORATORY DATA:  White count 5.2. Hemoglobin 12.8. Platelets 281,000.  BUN 13, creatinine 0.8, glucose 92. UA negative.   ASSESSMENT:  Krista [name] is a 77 year old female with a significant past  medical history of pelvic radiation. She had a chronic findings of  dilated small bowel loops with thickened walls into October 2006. She is  relatively asymptomatic. She does have intermittent abdominal pain and  nausea, which may be due to adhesions. 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 The acute exacerbation of her symptoms may  be to intermittent partial small bowel obstruction versus acute viral  illness. She has a low likelihood of ischemia. She has no evidence of  large vessel disease on CT scan. She has no signs or symptoms to suggest  inflammatory bowel disease. The CT scan does show some thickened versus  under-distended sigmoid colon. Her last colonoscopy was approximately 5  years ago. I extensively reviewed her old chart and her electronic  medical records.   Thank you for allowing me to see Krista [name] in consultation. My recommendations follow.   RECOMMENDATIONS:   1. I agree that she should advance to a soft diet. I would continue      anti-emetics and supportive care.  2. I will reassess her on tomorrow and consider performing a flexible      sigmoidoscopy to evaluate her sigmoid colon on Monday, February 17, 2006.  3. I have no ability to evaluate the dilated small bowel segments      endoscopically. She is not a candidate for capsule endoscopy,      unless she  is considered a surgical candidate, due to the very high      probability that she has an adhesion in her small bowel, that will      not allow the capsule to pass and would require surgery to retrieve      it. A capsule endoscopy should only be performed if the benefits of      the evaluation outweigh the risks of performing the procedure. I      believe that it is a low likelihood that she has a serosal tumor or      another process involving the particular segment of bowel. However,      the only definitive way to know would be to perform a capsule      endoscopy, or to perform exploratory laparotomy and evaluate that      segment.  4. If she becomes symptomatic from this small bowel segment, then      steroids may be considered as a non-invasive way to manage this      area. Again, the benefits of steroid therapy would have to outweigh      the risk of steroid therapy in this 77 year old female with      diabetes and at risk for infection, and hyperglycemia.      Kassie Mends, M.D.  Electronically Signed     SM/MEDQ  D:  02/15/2006  T:  02/15/2006  Job:  [id number]

## 2010-07-20 NOTE — Consult Note (Signed)
NAME:  [name], Krista [name] NO.:  [id number]   MEDICAL RECORD NO.:  [id number]          PATIENT TYPE:  OUT   LOCATION:  GYN                          FACILITY:  [facility]   PHYSICIAN:  De Blanch, M.D.DATE OF BIRTH:  April 01, 1931   DATE OF CONSULTATION:  11/22/2003  DATE OF DISCHARGE:                                   CONSULTATION   A 77 year old white female returns for follow-up, having undergone a radical  hysterectomy on November 01, 2003.  She had invasive moderately differentiated  squamous cell carcinoma which is deeply invasive to within 1 mm of the  cervical serosa.  There was also involvement and extension into the lower  uterine segment.  All lymph nodes were free of disease.  The patient had  uncomplicated postoperative course, although she has had significant  difficulties in voiding.  She has been clamping and releasing her suprapubic  catheter and has not been able to sufficiently void except on two occasions.  Most of the time, she has residuals of 100-200 mL.  Otherwise, she is doing  well.   PHYSICAL EXAMINATION:  VITAL SIGNS:  Weight 122 pounds, blood pressure  108/68.  GENERAL:  The patient is a healthy white female in no acute distress.  HEENT:  Negative.  NECK:  Supple without thyromegaly.  There is no supraclavicular or inguinal  adenopathy.  ABDOMEN:  Soft and nontender.  Her incision was well-healed.  The suprapubic  site appears healthy.  PELVIC:  EG/BUS, vagina, bladder, urethra are normal.  Vaginal cuff is  healing well and well supported.   IMPRESSION:  Status post radical hysterectomy with intermediate risk  features.  I would recommend the patient receive whole pelvis radiation  therapy.  We will arrange for her to be seen by a radiation therapist and  expect that she should be able to be treated in [place].   If she continues to have significant bladder dysfunction, she will continue  clamp and release and keep record of her voiding  trials and contact us when  her voiding pattern improves.  If this does not improve in the next 3-4  weeks, then I would strongly consider removing the suprapubic catheter and  teaching her how to perform intermittent self-catheterization.     Dani   DC/MEDQ  D:  11/22/2003  T:  11/23/2003  Job:  [id number]   cc:   [name], M.D.  [address] [place]  [place] [address]  Fax: [phone]   Telford Nab, R.N.  [address]  [place], [place] [address]

## 2010-07-20 NOTE — Group Therapy Note (Signed)
NAME:  Krista [name], Krista [name]              ACCOUNT NO.:  [id number]   MEDICAL RECORD NO.:  [id number]          PATIENT TYPE:  INP   LOCATION:  A303                          FACILITY:  [facility]   PHYSICIAN:  Margaretmary Dys, M.D.DATE OF BIRTH:  15-Apr-1931   DATE OF PROCEDURE:  06/02/2005  DATE OF DISCHARGE:                                   PROGRESS NOTE   SUBJECTIVE:  The patient was admitted yesterday with cellulitis in the pubic  area and also fever and weakness.  The patient has had these episodes in the  past with no clear etiology identified.  The patient did fairly well with  antibiotics.   Overnight the patient feels well.  She denies any fever or chills and no  nausea or vomiting.  She slept fairly well.  The patient was asking me if  this is may be related to cellulitis.  Ever since she had bladder surgery,  the patient states she has had urinary incontinence at home.   OBJECTIVE:  GENERAL APPEARANCE:  The patient is alert, comfortable, not in  acute distress.  VITAL SIGNS:  Blood pressure 111/65, pulse 68, respirations 18, temperature  98 degrees Fahrenheit.  Oxygen saturation is 96% on room air.  HEENT:  Normocephalic, atraumatic.  Oral mucosa was moist with no exudates.  NECK:  Supple.  No JVD, no lymphadenopathy.  LUNGS:  Clear clinically.  Good air entry bilaterally.  HEART:  S1, S2, regular.  No S3, S4, gallops or rubs.  ABDOMEN:  Soft, nontender.  Bowel sounds are positive.  No masses are  palpable.  EXTREMITIES:  No edema.  SKIN:  Pubic area shows erythema but appears much better than yesterday with  satellite lesions noted.  No evidence of pruritic lesions noted.  CNS:  Grossly intact with no focal deficits.   LABORATORY/DIAGNOSTIC DATA:  WBC count 6.7, hemoglobin 10.8, hematocrit  31.8.  Platelet count was 315, neutrophils of 80%.  Sodium 140, potassium  12.2, chloride 111, CO2 25, glucose 124, BUN 12, creatinine 1, calcium 8.5.  Blood cultures showed one bottle was  growing Gram-positive cocci in chains.  The other blood culture bottle is negative so far.   ASSESSMENT/PLAN:  1.  Cellulitis of the pubic area, possibly due to ammoniacal dermatitis from      continuous leakage of urine from bladder incontinence.  Other      possibilities include a fungal nfection which of course would be as the      result of the previously stated condition.  I have started her on Zosyn      because she is diabetic to cover for cellulitis.  I will put her on      nystatin cream application to the skin. I requested gynaecology  to see      her in consult.  Perhaps there is a way we can take a specimen and do a      KOH wet prep for candida.  Satellite lesions are fairly suggestive of      candida infection.  She may need to be on Diflucan.  Her fever has  improved, and her white cell count has also improved.  2.  Diabetes.  Blood sugars have ranged between 143 to 261.  Continue      sliding scale for now and oral hypoglycemic agents.  She is on      Glucophage 500 mg once a day.  3.  history of cervical cancer, status post surgery and radiation.  The      patient said she is stable.  She follows with her OB/GYN doctor in      [facility].  We will request OB/GYN to see her here too.  4.  Status post  status post thyroid cancer surgery.  The patient is stable      and has no hypothyroid or hyperthyroid symptoms.   DISPOSITION:  Continue IV antibiotics for presumed cellulitis.  Continue  nystatin cream to the pubic area.  Request OB/GYN consult, and continue  sliding scale and monitoring her blood sugars.      Margaretmary Dys, M.D.  Electronically Signed     AM/MEDQ  D:  06/02/2005  T:  06/02/2005  Job:  [id number]

## 2010-07-25 ENCOUNTER — Ambulatory Visit (HOSPITAL_COMMUNITY)
Admission: RE | Admit: 2010-07-25 | Discharge: 2010-07-25 | Disposition: A | Discharge status: Home / Self Care | Payer: Medicare Other | Source: Ambulatory Visit | Attending: Pulmonary Disease | Admitting: Pulmonary Disease

## 2010-07-25 DIAGNOSIS — M949 Disorder of cartilage, unspecified: Secondary | ICD-10-CM | POA: Insufficient documentation

## 2010-07-25 DIAGNOSIS — M899 Disorder of bone, unspecified: Secondary | ICD-10-CM | POA: Insufficient documentation

## 2010-08-27 ENCOUNTER — Ambulatory Visit (HOSPITAL_COMMUNITY)
Admission: RE | Admit: 2010-08-27 | Discharge: 2010-08-27 | Disposition: A | Discharge status: Home / Self Care | Payer: Medicare Other | Source: Ambulatory Visit | Attending: Pulmonary Disease | Admitting: Pulmonary Disease

## 2010-08-27 DIAGNOSIS — Z1231 Encounter for screening mammogram for malignant neoplasm of breast: Secondary | ICD-10-CM | POA: Insufficient documentation

## 2010-08-27 DIAGNOSIS — Z139 Encounter for screening, unspecified: Secondary | ICD-10-CM

## 2011-07-03 DIAGNOSIS — J189 Pneumonia, unspecified organism: Secondary | ICD-10-CM

## 2011-07-03 HISTORY — DX: Pneumonia, unspecified organism: J18.9

## 2011-07-17 ENCOUNTER — Other Ambulatory Visit (HOSPITAL_COMMUNITY): Payer: Self-pay | Admitting: Pulmonary Disease

## 2011-07-17 ENCOUNTER — Ambulatory Visit (HOSPITAL_COMMUNITY)
Admission: RE | Admit: 2011-07-17 | Discharge: 2011-07-17 | Disposition: A | Discharge status: Home / Self Care | Payer: Medicare Other | Source: Ambulatory Visit | Attending: Pulmonary Disease | Admitting: Pulmonary Disease

## 2011-07-17 DIAGNOSIS — R222 Localized swelling, mass and lump, trunk: Secondary | ICD-10-CM | POA: Insufficient documentation

## 2011-07-17 DIAGNOSIS — R042 Hemoptysis: Secondary | ICD-10-CM | POA: Insufficient documentation

## 2011-07-19 ENCOUNTER — Other Ambulatory Visit (HOSPITAL_COMMUNITY): Payer: Self-pay | Admitting: Pulmonary Disease

## 2011-07-19 DIAGNOSIS — R911 Solitary pulmonary nodule: Secondary | ICD-10-CM

## 2011-07-22 ENCOUNTER — Encounter (HOSPITAL_COMMUNITY): Payer: Self-pay

## 2011-07-22 ENCOUNTER — Ambulatory Visit (HOSPITAL_COMMUNITY)
Admission: RE | Admit: 2011-07-22 | Discharge: 2011-07-22 | Disposition: A | Discharge status: Home / Self Care | Payer: Medicare Other | Source: Ambulatory Visit | Attending: Pulmonary Disease | Admitting: Pulmonary Disease

## 2011-07-22 DIAGNOSIS — R911 Solitary pulmonary nodule: Secondary | ICD-10-CM | POA: Insufficient documentation

## 2011-07-22 DIAGNOSIS — Z8541 Personal history of malignant neoplasm of cervix uteri: Secondary | ICD-10-CM | POA: Insufficient documentation

## 2011-07-22 DIAGNOSIS — R9389 Abnormal findings on diagnostic imaging of other specified body structures: Secondary | ICD-10-CM | POA: Insufficient documentation

## 2011-07-22 HISTORY — DX: Malignant (primary) neoplasm, unspecified: C80.1

## 2011-07-22 MED ORDER — IOHEXOL 300 MG/ML  SOLN
64.0000 mL | Freq: Once | INTRAMUSCULAR | Status: AC | PRN
  Administered 2011-07-22: 64 mL via INTRAVENOUS

## 2011-08-06 ENCOUNTER — Institutional Professional Consult (permissible substitution) (INDEPENDENT_AMBULATORY_CARE_PROVIDER_SITE_OTHER): Payer: Medicare Other | Admitting: Thoracic Surgery (Cardiothoracic Vascular Surgery)

## 2011-08-06 ENCOUNTER — Other Ambulatory Visit: Payer: Self-pay | Admitting: Thoracic Surgery (Cardiothoracic Vascular Surgery)

## 2011-08-06 VITALS — BP 140/64 | HR 84 | Resp 18 | Ht 62.0 in | Wt 115.0 lb

## 2011-08-06 DIAGNOSIS — A150 Tuberculosis of lung: Secondary | ICD-10-CM

## 2011-08-06 DIAGNOSIS — A159 Respiratory tuberculosis unspecified: Secondary | ICD-10-CM | POA: Insufficient documentation

## 2011-08-06 DIAGNOSIS — R911 Solitary pulmonary nodule: Secondary | ICD-10-CM

## 2011-08-06 DIAGNOSIS — E890 Postprocedural hypothyroidism: Secondary | ICD-10-CM | POA: Insufficient documentation

## 2011-08-06 DIAGNOSIS — D381 Neoplasm of uncertain behavior of trachea, bronchus and lung: Secondary | ICD-10-CM

## 2011-08-06 DIAGNOSIS — K219 Gastro-esophageal reflux disease without esophagitis: Secondary | ICD-10-CM | POA: Insufficient documentation

## 2011-08-06 NOTE — Progress Notes (Signed)
PCP is Krista Maudlin, MD, MD Referring Provider is Krista Maudlin, MD  Chief Complaint  Krista Krista [name] presents with  . Lung Lesion    Referral from Dr Krista Krista [name] for eval on lung nodule, Chest CT 07/22/11    HPI: 78 year old Krista [name] with Krista chief complaint of coughing up blood.  Krista Krista [name] is Krista Krista [name] with Krista brief history of smoking as well as history of thyroid cancer and cervical cancer. Krista Krista [name] says Krista Krista [name] was doing well until early May when Krista Krista [name] began noting Krista Krista [name] was coughing up sputum streaked with blood. Krista Krista [name] says this has improved since Krista Krista [name] first noted it, and Krista Krista [name] only notices occasional specks of blood. Krista Krista [name] went to see Dr. Juanetta Krista [name] and he ordered Krista chest x-ray which showed Krista left upper lobe mass. Krista CT of the chest was done which showed Krista mass in the lingula with maximal diameter of 2.6 cm. There was no significant hilar or mediastinal adenopathy. There were some other small lung nodules noted bilaterally as well as some old granulomatous disease. Krista Krista [name] does have the history of tuberculosis in 1965. Krista Krista [name] says that Krista Krista [name] smoked about half Krista pack Krista day starting at around age 73 and for less than 10 years prior to quitting 30 years ago. Krista Krista [name] denies any weight loss. Krista Krista [name] does have Krista lot of cramps for which he takes magnesium. Krista Krista [name] also complains of unilateral left-sided headaches localized above Krista Krista [name] left eye.  Past Medical History  Diagnosis Date  . Diabetes mellitus     on met  . Cancer     cervical ca - 2005 - surg chemo  . CA - cancer     thyroid - 2008 surg  . Thyroid disease   . Hypertension   . Hyperlipidemia   . Osteoporosis   Tuberculosis 1965  Past Surgical History  Procedure Date  . No surgical history     Family History  Problem Relation Age of Onset  . Lung disease      Social History History  Substance Use Topics  . Smoking status: Former Smoker    Types: Cigarettes    Quit date: 03/04/1978  . Smokeless tobacco: Never Used  . Alcohol Use: No    Current Outpatient  Prescriptions  Medication Sig Dispense Refill  . alendronate (FOSAMAX) 70 MG tablet Take 70 mg by mouth every 7 (seven) days. Take with Krista full glass of water on an empty stomach.      Marland Kitchen atorvastatin (LIPITOR) 40 MG tablet Take 40 mg by mouth daily.      . budesonide-formoterol (SYMBICORT) 160-4.5 MCG/ACT inhaler Inhale 2 puffs into the lungs 2 (two) times daily.      Marland Kitchen levothyroxine (SYNTHROID, LEVOTHROID) 50 MCG tablet Take 50 mcg by mouth daily.      . Levothyroxine Sodium 75 MCG CAPS Take by mouth 1 day or 1 dose.      . meclizine (ANTIVERT) 25 MG tablet Take 25 mg by mouth 3 (three) times daily as needed.      . metFORMIN (GLUCOPHAGE) 500 MG tablet Take 500 mg by mouth daily with breakfast.      . pantoprazole (PROTONIX) 40 MG tablet Take 40 mg by mouth daily.      . rosuvastatin (CRESTOR) 20 MG tablet Take 20 mg by mouth daily.        Allergies  Allergen Reactions  . Amoxicillin     REACTION: Nausea  . Sulfonamide Derivatives     REACTION: Nausea    Review of  Systems  Constitutional: Negative for fever, chills and unexpected weight change.  Respiratory: Positive for cough (small streaks of blood). Negative for shortness of breath and wheezing.        History of TB in 1965  Cardiovascular: Positive for leg swelling. Negative for chest pain.  Musculoskeletal:       Cramps in calves and hands- Rx with Mg  Neurological: Positive for dizziness (when straitening up after bending over) and headaches (above left eye).  Hematological: Negative.   All other systems reviewed and are negative.    BP 140/64  Pulse 84  Resp 18  Ht 5\' 2"  (1.575 m)  Wt 115 lb (52.164 kg)  BMI 21.03 kg/m2  SpO2 99% Physical Exam  Vitals reviewed. Constitutional: Krista Krista [name] is oriented to person, place, and time. Krista Krista [name] appears well-developed and well-nourished. No distress.  HENT:  Head: Normocephalic and atraumatic.  Neck: Neck supple. No JVD present.       Well healed collar incision from prior  thyroidectomy  Cardiovascular: Normal rate, regular rhythm and normal heart sounds.  Exam reveals no gallop and no friction rub.   No murmur heard. Pulmonary/Chest: Effort normal and breath sounds normal. Krista Krista [name] has no wheezes. Krista Krista [name] has no rales.  Abdominal: Soft. There is no tenderness.  Musculoskeletal: Normal range of motion. Krista Krista [name] exhibits no edema.  Lymphadenopathy:    Krista Krista [name] has no cervical adenopathy.  Neurological: Krista Krista [name] is alert and oriented to person, place, and time. No cranial nerve deficit.  Skin: Skin is warm and dry.     Diagnostic Tests: CXR 07/17/2011 IMPRESSION:  Hyperinflation again noted. Atherosclerotic calcifications of  thoracic aorta again noted. No acute infiltrate or pulmonary  edema. Bony thorax is stable. There is Krista nodular mass measures at  least 2.4 cm in retrocardiac region left lower lobe. Further  evaluation with CT scan of the chest with IV contrast is  recommended.  CT Chest 07/22/11 IMPRESSION:  2.6 x 1.5 x 2.2 cm diameter soft tissue mass with smooth lobulated  margins within lingula adjacent to the left heart border, question  metastatic disease in Krista Krista [name] with history of cervical and thyroid  cancer.  Additional nonspecific pulmonary nodules as above as well as Krista 5 mm  diameter nodule versus lymph node at the left thyroid bed post  thyroidectomy.  Due to the iodine load from iodinated contrast material utilized  for this CT exam, unable to perform radioactive iodine whole body  scan to differentiate.  Therefore, consultation with the [facility] [phone]) should be considered.  Impression/Plan: 78 year old Krista [name] who presented with hemoptysis and is found to have Krista new lung nodule in the lingula. In reviewing Krista Krista [name] old chest x-ray from January of 2011 it appears this lesion may of been present albeit Krista much smaller size at the time. I wouldn't have recognized it is Krista nodule at that time but in retrospect I suspect  it was there.   I long discussion with the Krista Krista [name] and Krista Krista [name] daughter and reviewed the films with them. They understand that this is not definitively lung cancer, but is highly suspicious and has to be considered lung cancer until it can be proven otherwise. Possibly this could represent metastatic disease as Krista Krista [name] has had both thyroid cancer and cervical cancer. Far less likely would be that this is Krista benign lesion.  My recommendation to Krista Krista [name] is that we proceed with Krista diagnostic and staging workup.  1. Pulmonary function testing to assess ability to tolerate resection 2.  PET/CT to further assess but the primary lesion as well as to rule out involvement of distant sites 3. Head MRI given that Krista Krista [name] is having unilateral headaches.  After those tests are completed I'll see Krista Krista [name] back in the office. Assuming Krista Krista [name] PFTs are adequate and there is no evidence of metastatic disease my recommendation would be to proceed with Krista left VATS, wedge resection, and possible lobectomy or segmentectomy if this does turn out to be lung cancer.

## 2011-08-09 ENCOUNTER — Encounter (HOSPITAL_COMMUNITY): Payer: Medicare Other

## 2011-08-14 ENCOUNTER — Ambulatory Visit (HOSPITAL_COMMUNITY): Admission: RE | Admit: 2011-08-14 | Payer: Medicare Other | Source: Ambulatory Visit

## 2011-08-14 ENCOUNTER — Ambulatory Visit (HOSPITAL_COMMUNITY)
Admission: RE | Admit: 2011-08-14 | Discharge: 2011-08-14 | Disposition: A | Discharge status: Home / Self Care | Payer: Medicare Other | Source: Ambulatory Visit | Attending: Thoracic Surgery (Cardiothoracic Vascular Surgery) | Admitting: Thoracic Surgery (Cardiothoracic Vascular Surgery)

## 2011-08-14 ENCOUNTER — Encounter (HOSPITAL_COMMUNITY)
Admission: RE | Admit: 2011-08-14 | Discharge: 2011-08-14 | Disposition: A | Discharge status: Home / Self Care | Payer: Medicare Other | Source: Ambulatory Visit | Attending: Thoracic Surgery (Cardiothoracic Vascular Surgery) | Admitting: Thoracic Surgery (Cardiothoracic Vascular Surgery)

## 2011-08-14 DIAGNOSIS — N289 Disorder of kidney and ureter, unspecified: Secondary | ICD-10-CM | POA: Insufficient documentation

## 2011-08-14 DIAGNOSIS — R220 Localized swelling, mass and lump, head: Secondary | ICD-10-CM | POA: Insufficient documentation

## 2011-08-14 DIAGNOSIS — Z9089 Acquired absence of other organs: Secondary | ICD-10-CM | POA: Insufficient documentation

## 2011-08-14 DIAGNOSIS — D381 Neoplasm of uncertain behavior of trachea, bronchus and lung: Secondary | ICD-10-CM

## 2011-08-14 DIAGNOSIS — Z96649 Presence of unspecified artificial hip joint: Secondary | ICD-10-CM | POA: Insufficient documentation

## 2011-08-14 DIAGNOSIS — R519 Headache, unspecified: Secondary | ICD-10-CM

## 2011-08-14 DIAGNOSIS — R222 Localized swelling, mass and lump, trunk: Secondary | ICD-10-CM | POA: Insufficient documentation

## 2011-08-14 DIAGNOSIS — Z538 Procedure and treatment not carried out for other reasons: Secondary | ICD-10-CM | POA: Insufficient documentation

## 2011-08-14 DIAGNOSIS — R918 Other nonspecific abnormal finding of lung field: Secondary | ICD-10-CM | POA: Insufficient documentation

## 2011-08-14 DIAGNOSIS — J984 Other disorders of lung: Secondary | ICD-10-CM | POA: Insufficient documentation

## 2011-08-14 LAB — CREATININE, SERUM
GFR calc Af Amer: 40 mL/min — ABNORMAL LOW (ref >90)
GFR calc non Af Amer: 35 mL/min — ABNORMAL LOW (ref >90)

## 2011-08-14 MED ORDER — FLUDEOXYGLUCOSE F - 18 (FDG) INJECTION
20.0000 | Freq: Once | INTRAVENOUS | Status: AC | PRN
  Administered 2011-08-14: 20 via INTRAVENOUS

## 2011-08-15 ENCOUNTER — Telehealth: Payer: Self-pay

## 2011-08-15 DIAGNOSIS — F419 Anxiety disorder, unspecified: Secondary | ICD-10-CM

## 2011-08-15 MED ORDER — ALPRAZOLAM 0.5 MG PO TABS: 0.5000 mg | ORAL_TABLET | Freq: Once | ORAL | Status: DC | PRN

## 2011-08-15 NOTE — Telephone Encounter (Signed)
Pt's Head MRI was rescheduled for next Wednesday. RX for Xanax 0.5 mg 1/2-1 tablet 1 hour prior to MRI called into pt's pharm. Pt is aware. F/U appt with Dr Dorris Fetch is on 08/20/11

## 2011-08-20 ENCOUNTER — Ambulatory Visit (INDEPENDENT_AMBULATORY_CARE_PROVIDER_SITE_OTHER): Payer: Medicare Other | Admitting: Thoracic Surgery (Cardiothoracic Vascular Surgery)

## 2011-08-20 ENCOUNTER — Other Ambulatory Visit: Payer: Self-pay

## 2011-08-20 ENCOUNTER — Encounter: Payer: Self-pay | Admitting: Thoracic Surgery (Cardiothoracic Vascular Surgery)

## 2011-08-20 ENCOUNTER — Other Ambulatory Visit: Payer: Self-pay | Admitting: Thoracic Surgery (Cardiothoracic Vascular Surgery)

## 2011-08-20 ENCOUNTER — Ambulatory Visit (HOSPITAL_COMMUNITY)
Admission: RE | Admit: 2011-08-20 | Discharge: 2011-08-20 | Disposition: A | Discharge status: Home / Self Care | Payer: Medicare Other | Source: Ambulatory Visit | Attending: Thoracic Surgery (Cardiothoracic Vascular Surgery) | Admitting: Thoracic Surgery (Cardiothoracic Vascular Surgery)

## 2011-08-20 ENCOUNTER — Other Ambulatory Visit (HOSPITAL_COMMUNITY): Payer: Medicare Other

## 2011-08-20 ENCOUNTER — Inpatient Hospital Stay (HOSPITAL_COMMUNITY): Admission: RE | Admit: 2011-08-20 | Payer: Medicare Other | Source: Ambulatory Visit

## 2011-08-20 VITALS — BP 130/76 | HR 90 | Resp 20 | Ht 62.0 in | Wt 113.0 lb

## 2011-08-20 DIAGNOSIS — R05 Cough: Secondary | ICD-10-CM | POA: Insufficient documentation

## 2011-08-20 DIAGNOSIS — R0989 Other specified symptoms and signs involving the circulatory and respiratory systems: Secondary | ICD-10-CM | POA: Insufficient documentation

## 2011-08-20 DIAGNOSIS — D381 Neoplasm of uncertain behavior of trachea, bronchus and lung: Secondary | ICD-10-CM

## 2011-08-20 DIAGNOSIS — R918 Other nonspecific abnormal finding of lung field: Secondary | ICD-10-CM

## 2011-08-20 DIAGNOSIS — R059 Cough, unspecified: Secondary | ICD-10-CM | POA: Insufficient documentation

## 2011-08-20 DIAGNOSIS — R42 Dizziness and giddiness: Secondary | ICD-10-CM

## 2011-08-20 DIAGNOSIS — R0609 Other forms of dyspnea: Secondary | ICD-10-CM | POA: Insufficient documentation

## 2011-08-20 DIAGNOSIS — Z87891 Personal history of nicotine dependence: Secondary | ICD-10-CM | POA: Insufficient documentation

## 2011-08-20 LAB — BLOOD GAS, ARTERIAL
Drawn by: 122061
FIO2: 0.21 %
Patient temperature: 98.6
pCO2 arterial: 34.8 mmHg — ABNORMAL LOW (ref 35.0–45.0)
pH, Arterial: 7.418 — ABNORMAL HIGH (ref 7.350–7.400)

## 2011-08-20 LAB — PULMONARY FUNCTION TEST

## 2011-08-20 MED ORDER — ALBUTEROL SULFATE (5 MG/ML) 0.5% IN NEBU
2.5000 mg | INHALATION_SOLUTION | Freq: Once | RESPIRATORY_TRACT | Status: AC
  Administered 2011-08-20: 2.5 mg via RESPIRATORY_TRACT

## 2011-08-20 NOTE — Progress Notes (Signed)
HPI:  Krista [name] returns today to discuss the results of her testing. She was recently seen in the office to evaluate a left upper lobe lung mass.   She had presented with a chief complaint of coughing up blood.  She has a brief history of smoking, as well as history of thyroid cancer and cervical cancer. She was doing well until early May when she began coughing up sputum streaked with blood. This has improved since then, but she still notices occasional specks of blood. A chest x-ray which showed a left upper lobe mass. A CT of the chest was done which showed a mass in the lingula with maximal diameter of 2.6 cm. There was no significant hilar or mediastinal adenopathy. There were some other small lung nodules noted bilaterally as well as some old granulomatous disease. She does have the history of tuberculosis in 1965.   She had a PET/CT and pulmonary function testing done. However she was unable to tolerate the MRI due to noise and anxiety.  Past Medical History  Diagnosis Date  . Diabetes mellitus     on met  . Cancer     cervical ca - 2005 - surg chemo  . CA - cancer     thyroid - 2008 surg  . Thyroid disease   . Hypertension   . Hyperlipidemia   . Osteoporosis   . Tuberculosis     diagnosed 1965       Current Outpatient Prescriptions  Medication Sig Dispense Refill  . alendronate (FOSAMAX) 70 MG tablet Take 70 mg by mouth every 7 (seven) days. Take with a full glass of water on an empty stomach.      . aspirin 81 MG tablet Take 81 mg by mouth daily.      . budesonide-formoterol (SYMBICORT) 160-4.5 MCG/ACT inhaler Inhale 2 puffs into the lungs 2 (two) times daily.      . Levothyroxine Sodium 75 MCG CAPS Take by mouth 1 day or 1 dose.      . meclizine (ANTIVERT) 25 MG tablet Take 25 mg by mouth 3 (three) times daily as needed.      . metFORMIN (GLUCOPHAGE) 500 MG tablet Take 500 mg by mouth daily with breakfast.      . pantoprazole (PROTONIX) 40 MG tablet Take 40 mg by  mouth daily.      . rosuvastatin (CRESTOR) 20 MG tablet Take 20 mg by mouth daily.      . ALPRAZolam (XANAX) 0.5 MG tablet Take 1 tablet (0.5 mg total) by mouth once as needed for anxiety (TAKE 1/2 - 1 TABLET PO 1 HOUR PRIOR TO MRI. ).  1 tablet  0   No current facility-administered medications for this visit.   Facility-Administered Medications Ordered in Other Visits  Medication Dose Route Frequency Provider Last Rate Last Dose  . albuterol (PROVENTIL) (5 MG/ML) 0.5% nebulizer solution 2.5 mg  2.5 mg Nebulization Once Jerrold Haskell C Laterria Lasota, MD   2.5 mg at 08/20/11 1150    Physical Exam BP 130/76  Pulse 90  Resp 20  Ht 5' 2" (1.575 m)  Wt 113 lb (51.256 kg)  BMI 20.67 kg/m2  SpO2 98% Thin, frail-appearing 78-year-old woman in no acute distress, but very anxious No palpable cervical or suprapubic or adenopathy, well-healed collar incision Lungs clear with equal breath sounds bilaterally, no wheezes Cardiac regular rate and rhythm normal S1 and S2  Diagnostic Tests: PET/CT 08/14/2011  IMPRESSION:  Two adjacent pulmonary nodules in the posterior lingula   are  hypermetabolic, consistent with neoplasm.  Tiny soft tissue nodule in the left supraclavicular region also  shows increased F D G uptake. The patient is status post  thyroidectomy in this is in close proximity to the left thyroid  bed. While metastatic involvement of the supraclavicular lymph  node can certainly have this appearance, F D G uptake within some  residual thyroid tissue would also be a possibility. No evidence  for hypermetabolic metastases in the hilar regions or mediastinum.  Pulmonary function testing 08/20/2011 FVC 1.99 (81% predicted)  FEV1 1.48 (81% predicted) FEF 25-75% 76% predicted DLCO uncorrected 41%, corrected 235% (unconfirmed)  Impression: 78-year-old woman with a history of thyroid and cervical cancer, as well as a moderate smoking history, with a 2.6 cm left upper lobe mass that is  hypermetabolic by PET CT. This actually is to adjacent nodules which are both hypermetabolic. SUV max is 23. This is highly suspicious for primary lung cancer.  I spent been a great deal of time with Krista [name] and her daughter explaining the implications of these findings. They understand this most likely is a new lung cancer. There is no sign of hilar or mediastinal adenopathy or distant metastases. Therefore is potentially curable with surgical resection. I discussed in detail with them the nature of the surgery including the need for general anesthesia, incisions be used, expected hospital stay, and the overall recovery. We also discussed the indications, risks, benefits and alternatives. She understands the risks of surgery include but are not limited to death, stroke, MI, DVT, PE, bleeding, possible need for transfusion, infection, air leaks, cardiac arrhythmias, as well as other unpredictable complications.  Krista [name] is very reluctant to consider surgery. She wishes to me with radiation oncologist to discuss potential treatment with radiation and/or chemotherapy. She understands that surgery offers the best chance for cure.  I recommended to her that since she wants take that approach that we should do an electromagnetic navigational bronchoscopy to make a diagnosis. She understands that this is done under general anesthesia in the operating room, but does not involve any incisions and is done on an outpatient basis. She will need a new CT of the chest using super dimension protocol prior to the procedure. She understands the risk of procedure that related for general anesthesia, bleeding, pneumothorax, nondiagnostic biopsies.  She could not tolerate having an MR of the brain. To schedule her for a CT of the head with contrast to rule out brain metastases  Plan: CT of brain with contrast Repeat CT of chest was super dimension protocol in preparation for ENB ENB as outpatient on Wednesday,  09/04/2011. Once we have a definitive diagnosis I will once again offer her the option of surgical resection. In the meantime we will have her seen by radiation oncology says she will have a better idea of what the has to offer.   

## 2011-08-22 ENCOUNTER — Encounter: Payer: Self-pay | Admitting: Thoracic Surgery (Cardiothoracic Vascular Surgery)

## 2011-08-23 ENCOUNTER — Ambulatory Visit (HOSPITAL_COMMUNITY)
Admission: RE | Admit: 2011-08-23 | Discharge: 2011-08-23 | Disposition: A | Discharge status: Home / Self Care | Payer: Medicare Other | Source: Ambulatory Visit | Attending: Thoracic Surgery (Cardiothoracic Vascular Surgery) | Admitting: Thoracic Surgery (Cardiothoracic Vascular Surgery)

## 2011-08-23 DIAGNOSIS — R918 Other nonspecific abnormal finding of lung field: Secondary | ICD-10-CM

## 2011-08-23 DIAGNOSIS — R222 Localized swelling, mass and lump, trunk: Secondary | ICD-10-CM | POA: Insufficient documentation

## 2011-08-23 DIAGNOSIS — R42 Dizziness and giddiness: Secondary | ICD-10-CM | POA: Insufficient documentation

## 2011-08-23 MED ORDER — IOHEXOL 300 MG/ML  SOLN
100.0000 mL | Freq: Once | INTRAMUSCULAR | Status: AC | PRN
  Administered 2011-08-23: 100 mL via INTRAVENOUS

## 2011-08-26 ENCOUNTER — Encounter (HOSPITAL_COMMUNITY): Payer: Self-pay | Admitting: Pharmacy Technician

## 2011-08-28 ENCOUNTER — Other Ambulatory Visit (HOSPITAL_COMMUNITY): Payer: Medicare Other

## 2011-08-28 ENCOUNTER — Encounter (HOSPITAL_COMMUNITY)
Admission: RE | Admit: 2011-08-28 | Discharge: 2011-08-28 | Disposition: A | Discharge status: Home / Self Care | Payer: Medicare Other | Source: Ambulatory Visit | Attending: Thoracic Surgery (Cardiothoracic Vascular Surgery) | Admitting: Thoracic Surgery (Cardiothoracic Vascular Surgery)

## 2011-08-28 ENCOUNTER — Encounter (HOSPITAL_COMMUNITY): Payer: Self-pay

## 2011-08-28 VITALS — BP 135/76 | HR 65 | Temp 97.8°F | Resp 20 | Ht 62.0 in | Wt 110.9 lb

## 2011-08-28 DIAGNOSIS — D381 Neoplasm of uncertain behavior of trachea, bronchus and lung: Secondary | ICD-10-CM

## 2011-08-28 HISTORY — DX: Dizziness and giddiness: R42

## 2011-08-28 HISTORY — DX: Personal history of tuberculosis: Z86.11

## 2011-08-28 HISTORY — DX: Personal history of other diseases of the nervous system and sense organs: Z86.69

## 2011-08-28 HISTORY — DX: Pneumonia, unspecified organism: J18.9

## 2011-08-28 HISTORY — DX: Gastro-esophageal reflux disease without esophagitis: K21.9

## 2011-08-28 LAB — CBC
HCT: 32.1 % — ABNORMAL LOW (ref 36.0–46.0)
MCH: 30.4 pg (ref 26.0–34.0)
MCV: 89.4 fL (ref 78.0–100.0)
Platelets: 345 10*3/uL (ref 150–400)
RBC: 3.59 MIL/uL — ABNORMAL LOW (ref 3.87–5.11)

## 2011-08-28 LAB — COMPREHENSIVE METABOLIC PANEL
AST: 19 U/L (ref 0–37)
BUN: 18 mg/dL (ref 6–23)
CO2: 26 mEq/L (ref 19–32)
Calcium: 9.8 mg/dL (ref 8.4–10.5)
Creatinine, Ser: 1.51 mg/dL — ABNORMAL HIGH (ref 0.50–1.10)

## 2011-08-28 NOTE — Pre-Procedure Instructions (Signed)
20 Krista [name]  08/28/2011   Your procedure is scheduled on:  July 3  Report to [facility] at 08:30 AM.  Call this number if you have problems the morning of surgery: [phone]   Remember:   Do not eat or drink:After Midnight.  Take these medicines the morning of surgery with A SIP OF WATER: Symbicort, Levothyroxine, Meclizine, Pantoprazole    STOP Aspirin, Calcium after today  Do not wear jewelry, make-up or nail polish.  Do not wear lotions, powders, or perfumes. You may wear deodorant.  Do not shave 48 hours prior to surgery. Men may shave face and neck.  Do not bring valuables to the hospital.  Contacts, dentures or bridgework may not be worn into surgery.  Leave suitcase in the car. After surgery it may be brought to your room.  For patients admitted to the hospital, checkout time is 11:00 AM the day of discharge.   Patients discharged the day of surgery will not be allowed to drive home.  Name and phone number of your driver: Mikey Bussing [phone]  Special Instructions: CHG Shower Use Special Wash: 1/2 bottle night before surgery and 1/2 bottle morning of surgery.   Please read over the following fact sheets that you were given: Pain Booklet, Coughing and Deep Breathing and Surgical Site Infection Prevention

## 2011-09-04 ENCOUNTER — Ambulatory Visit (HOSPITAL_COMMUNITY): Payer: Medicare Other

## 2011-09-04 ENCOUNTER — Encounter (HOSPITAL_COMMUNITY): Payer: Self-pay | Admitting: Anesthesiology

## 2011-09-04 ENCOUNTER — Ambulatory Visit (HOSPITAL_COMMUNITY)
Admission: RE | Admit: 2011-09-04 | Discharge: 2011-09-04 | Disposition: A | Discharge status: Home / Self Care | Payer: Medicare Other | Source: Ambulatory Visit | Attending: Thoracic Surgery (Cardiothoracic Vascular Surgery) | Admitting: Thoracic Surgery (Cardiothoracic Vascular Surgery)

## 2011-09-04 ENCOUNTER — Ambulatory Visit (HOSPITAL_COMMUNITY): Payer: Medicare Other | Admitting: Anesthesiology

## 2011-09-04 ENCOUNTER — Encounter (HOSPITAL_COMMUNITY)
Admission: RE | Disposition: A | Discharge status: Home / Self Care | Payer: Self-pay | Source: Ambulatory Visit | Attending: Thoracic Surgery (Cardiothoracic Vascular Surgery)

## 2011-09-04 DIAGNOSIS — D381 Neoplasm of uncertain behavior of trachea, bronchus and lung: Secondary | ICD-10-CM

## 2011-09-04 DIAGNOSIS — Z85850 Personal history of malignant neoplasm of thyroid: Secondary | ICD-10-CM | POA: Insufficient documentation

## 2011-09-04 DIAGNOSIS — Z01810 Encounter for preprocedural cardiovascular examination: Secondary | ICD-10-CM | POA: Insufficient documentation

## 2011-09-04 DIAGNOSIS — E785 Hyperlipidemia, unspecified: Secondary | ICD-10-CM | POA: Insufficient documentation

## 2011-09-04 DIAGNOSIS — Z8611 Personal history of tuberculosis: Secondary | ICD-10-CM | POA: Insufficient documentation

## 2011-09-04 DIAGNOSIS — C3490 Malignant neoplasm of unspecified part of unspecified bronchus or lung: Secondary | ICD-10-CM

## 2011-09-04 DIAGNOSIS — Z8541 Personal history of malignant neoplasm of cervix uteri: Secondary | ICD-10-CM | POA: Insufficient documentation

## 2011-09-04 DIAGNOSIS — M810 Age-related osteoporosis without current pathological fracture: Secondary | ICD-10-CM | POA: Insufficient documentation

## 2011-09-04 DIAGNOSIS — E119 Type 2 diabetes mellitus without complications: Secondary | ICD-10-CM | POA: Insufficient documentation

## 2011-09-04 DIAGNOSIS — Z01812 Encounter for preprocedural laboratory examination: Secondary | ICD-10-CM | POA: Insufficient documentation

## 2011-09-04 DIAGNOSIS — I10 Essential (primary) hypertension: Secondary | ICD-10-CM | POA: Insufficient documentation

## 2011-09-04 DIAGNOSIS — C3410 Malignant neoplasm of upper lobe, unspecified bronchus or lung: Secondary | ICD-10-CM | POA: Insufficient documentation

## 2011-09-04 HISTORY — PX: LUNG BIOPSY: SHX232

## 2011-09-04 HISTORY — DX: Malignant neoplasm of unspecified part of unspecified bronchus or lung: C34.90

## 2011-09-04 LAB — GLUCOSE, CAPILLARY: Glucose-Capillary: 105 mg/dL — ABNORMAL HIGH (ref 70–99)

## 2011-09-04 SURGERY — VIDEO BRONCHOSCOPY WITH ENDOBRONCHIAL NAVIGATION
Anesthesia: General | Site: Chest | Wound class: Clean Contaminated

## 2011-09-04 MED ORDER — PROPOFOL 10 MG/ML IV EMUL
INTRAVENOUS | Status: DC | PRN
  Administered 2011-09-04: 130 mg via INTRAVENOUS

## 2011-09-04 MED ORDER — PHENYLEPHRINE HCL 10 MG/ML IJ SOLN
10.0000 mg | INTRAVENOUS | Status: DC | PRN
  Administered 2011-09-04: 20 ug/min via INTRAVENOUS

## 2011-09-04 MED ORDER — 0.9 % SODIUM CHLORIDE (POUR BTL) OPTIME
TOPICAL | Status: DC | PRN
  Administered 2011-09-04: 1000 mL

## 2011-09-04 MED ORDER — OXYCODONE HCL 5 MG PO TABS: 5.0000 mg | ORAL_TABLET | Freq: Once | ORAL | Status: DC | PRN

## 2011-09-04 MED ORDER — METOCLOPRAMIDE HCL 5 MG/ML IJ SOLN: 10.0000 mg | Freq: Once | INTRAMUSCULAR | Status: DC | PRN

## 2011-09-04 MED ORDER — METOCLOPRAMIDE HCL 5 MG/ML IJ SOLN
INTRAMUSCULAR | Status: DC | PRN
  Administered 2011-09-04: 10 mg via INTRAVENOUS

## 2011-09-04 MED ORDER — LACTATED RINGERS IV SOLN
INTRAVENOUS | Status: DC | PRN
  Administered 2011-09-04 (×2): via INTRAVENOUS

## 2011-09-04 MED ORDER — NEOSTIGMINE METHYLSULFATE 1 MG/ML IJ SOLN
INTRAMUSCULAR | Status: DC | PRN
  Administered 2011-09-04: 3 mg via INTRAVENOUS

## 2011-09-04 MED ORDER — FENTANYL CITRATE 0.05 MG/ML IJ SOLN: 25.0000 ug | INTRAMUSCULAR | Status: DC | PRN

## 2011-09-04 MED ORDER — LACTATED RINGERS IV SOLN
INTRAVENOUS | Status: DC
  Administered 2011-09-04: 11:00:00 via INTRAVENOUS

## 2011-09-04 MED ORDER — DEXAMETHASONE SODIUM PHOSPHATE 10 MG/ML IJ SOLN
INTRAMUSCULAR | Status: DC | PRN
  Administered 2011-09-04: 4 mg via INTRAVENOUS

## 2011-09-04 MED ORDER — GLYCOPYRROLATE 0.2 MG/ML IJ SOLN
INTRAMUSCULAR | Status: DC | PRN
  Administered 2011-09-04: .4 mg via INTRAVENOUS

## 2011-09-04 MED ORDER — LIDOCAINE HCL (CARDIAC) 20 MG/ML IV SOLN
INTRAVENOUS | Status: DC | PRN
  Administered 2011-09-04: 60 mg via INTRAVENOUS

## 2011-09-04 MED ORDER — ONDANSETRON HCL 4 MG/2ML IJ SOLN
INTRAMUSCULAR | Status: DC | PRN
  Administered 2011-09-04: 4 mg via INTRAVENOUS

## 2011-09-04 MED ORDER — ROCURONIUM BROMIDE 100 MG/10ML IV SOLN
INTRAVENOUS | Status: DC | PRN
  Administered 2011-09-04: 10 mg via INTRAVENOUS
  Administered 2011-09-04: 5 mg via INTRAVENOUS
  Administered 2011-09-04: 20 mg via INTRAVENOUS

## 2011-09-04 SURGICAL SUPPLY — 32 items
BRUSH SUPERTRAX BIOPSY (INSTRUMENTS) ×1 IMPLANT
BRUSH SUPERTRAX NDL-TIP CYTO (INSTRUMENTS) ×1 IMPLANT
CANISTER SUCTION 2500CC (MISCELLANEOUS) ×2 IMPLANT
CHANNEL WORK EXTEND EDGE 180 (KITS) IMPLANT
CHANNEL WORK EXTEND EDGE 45 (KITS) IMPLANT
CHANNEL WORK EXTEND EDGE 90 (KITS) IMPLANT
CLOTH BEACON ORANGE TIMEOUT ST (SAFETY) ×2 IMPLANT
CONT SPEC 4OZ CLIKSEAL STRL BL (MISCELLANEOUS) ×4 IMPLANT
COVER TABLE BACK 60X90 (DRAPES) ×2 IMPLANT
FILTER STRAW FLUID ASPIR (MISCELLANEOUS) IMPLANT
FORCEPS BIOP SUPERTRX PREMAR (INSTRUMENTS) ×1 IMPLANT
GLOVE EUDERMIC 7 POWDERFREE (GLOVE) ×2 IMPLANT
GOWN PREVENTION PLUS XLARGE (GOWN DISPOSABLE) ×2 IMPLANT
KIT LOCATABLE GUIDE (CANNULA) IMPLANT
KIT MARKER FIDUCIAL DELIVERY (KITS) IMPLANT
KIT PROCEDURE EDGE 180 (KITS) IMPLANT
KIT PROCEDURE EDGE 45 (KITS) IMPLANT
KIT PROCEDURE EDGE 90 (KITS) ×1 IMPLANT
KIT ROOM TURNOVER OR (KITS) ×2 IMPLANT
MARKER SKIN DUAL TIP RULER LAB (MISCELLANEOUS) ×2 IMPLANT
NDL SUPERTRX PREMARK BIOPSY (NEEDLE) IMPLANT
NEEDLE SUPERTRX PREMARK BIOPSY (NEEDLE) ×2 IMPLANT
NS IRRIG 1000ML POUR BTL (IV SOLUTION) ×2 IMPLANT
OIL SILICONE PENTAX (PARTS (SERVICE/REPAIRS)) ×2 IMPLANT
PAD ARMBOARD 7.5X6 YLW CONV (MISCELLANEOUS) ×4 IMPLANT
PATCHES PATIENT (LABEL) ×2 IMPLANT
SPONGE GAUZE 4X4 12PLY (GAUZE/BANDAGES/DRESSINGS) ×2 IMPLANT
SYR 20ML ECCENTRIC (SYRINGE) ×2 IMPLANT
SYR 30ML LL (SYRINGE) ×2 IMPLANT
TOWEL OR 17X24 6PK STRL BLUE (TOWEL DISPOSABLE) ×2 IMPLANT
TRAP SPECIMEN MUCOUS 40CC (MISCELLANEOUS) ×3 IMPLANT
TUBE CONNECTING 12X1/4 (SUCTIONS) ×2 IMPLANT

## 2011-09-04 NOTE — Anesthesia Preprocedure Evaluation (Signed)
Anesthesia Evaluation  Patient identified by MRN, date of birth, ID band Patient awake    Reviewed: Allergy & Precautions, H&P , NPO status , Patient's Chart, lab work & pertinent test results, reviewed documented beta blocker date and time   Airway Mallampati: II TM Distance: >3 FB Neck ROM: full    Dental   Pulmonary pneumonia ,          Cardiovascular hypertension, Pt. on medications     Neuro/Psych negative neurological ROS  negative psych ROS   GI/Hepatic Neg liver ROS, GERD-  Medicated and Controlled,  Endo/Other  Diabetes mellitus-, Oral Hypoglycemic Agents  Renal/GU negative Renal ROS  negative genitourinary   Musculoskeletal   Abdominal   Peds  Hematology negative hematology ROS (+)   Anesthesia Other Findings See surgeon's H&P   Reproductive/Obstetrics negative OB ROS                           Anesthesia Physical Anesthesia Plan  ASA: III  Anesthesia Plan: General   Post-op Pain Management:    Induction: Intravenous  Airway Management Planned: Oral ETT  Additional Equipment:   Intra-op Plan:   Post-operative Plan: Extubation in OR  Informed Consent: I have reviewed the patients History and Physical, chart, labs and discussed the procedure including the risks, benefits and alternatives for the proposed anesthesia with the patient or authorized representative who has indicated his/her understanding and acceptance.   Dental Advisory Given  Plan Discussed with: CRNA and Surgeon  Anesthesia Plan Comments:         Anesthesia Quick Evaluation

## 2011-09-04 NOTE — Preoperative (Signed)
Beta Blockers   Reason not to administer Beta Blockers:Not Applicable 

## 2011-09-04 NOTE — H&P (View-Only) (Signed)
HPI:  Krista [name] returns today to discuss the results of her testing. She was recently seen in the office to evaluate a left upper lobe lung mass.   She had presented with a chief complaint of coughing up blood.  She has a brief history of smoking, as well as history of thyroid cancer and cervical cancer. She was doing well until early May when she began coughing up sputum streaked with blood. This has improved since then, but she still notices occasional specks of blood. A chest x-ray which showed a left upper lobe mass. A CT of the chest was done which showed a mass in the lingula with maximal diameter of 2.6 cm. There was no significant hilar or mediastinal adenopathy. There were some other small lung nodules noted bilaterally as well as some old granulomatous disease. She does have the history of tuberculosis in 1965.   She had a PET/CT and pulmonary function testing done. However she was unable to tolerate the MRI due to noise and anxiety.  Past Medical History  Diagnosis Date  . Diabetes mellitus     on met  . Cancer     cervical ca - 2005 - surg chemo  . CA - cancer     thyroid - 2008 surg  . Thyroid disease   . Hypertension   . Hyperlipidemia   . Osteoporosis   . Tuberculosis     diagnosed 1965       Current Outpatient Prescriptions  Medication Sig Dispense Refill  . alendronate (FOSAMAX) 70 MG tablet Take 70 mg by mouth every 7 (seven) days. Take with a full glass of water on an empty stomach.      Marland Kitchen aspirin 81 MG tablet Take 81 mg by mouth daily.      . budesonide-formoterol (SYMBICORT) 160-4.5 MCG/ACT inhaler Inhale 2 puffs into the lungs 2 (two) times daily.      . Levothyroxine Sodium 75 MCG CAPS Take by mouth 1 day or 1 dose.      . meclizine (ANTIVERT) 25 MG tablet Take 25 mg by mouth 3 (three) times daily as needed.      . metFORMIN (GLUCOPHAGE) 500 MG tablet Take 500 mg by mouth daily with breakfast.      . pantoprazole (PROTONIX) 40 MG tablet Take 40 mg by  mouth daily.      . rosuvastatin (CRESTOR) 20 MG tablet Take 20 mg by mouth daily.      Marland Kitchen ALPRAZolam (XANAX) 0.5 MG tablet Take 1 tablet (0.5 mg total) by mouth once as needed for anxiety (TAKE 1/2 - 1 TABLET PO 1 HOUR PRIOR TO MRI. ).  1 tablet  0   No current facility-administered medications for this visit.   Facility-Administered Medications Ordered in Other Visits  Medication Dose Route Frequency Provider Last Rate Last Dose  . albuterol (PROVENTIL) (5 MG/ML) 0.5% nebulizer solution 2.5 mg  2.5 mg Nebulization Once Loreli Slot, MD   2.5 mg at 08/20/11 1150    Physical Exam BP 130/76  Pulse 90  Resp 20  Ht 5\' 2"  (1.575 m)  Wt 113 lb (51.256 kg)  BMI 20.67 kg/m2  SpO2 98% Thin, frail-appearing 78 year old woman in no acute distress, but very anxious No palpable cervical or suprapubic or adenopathy, well-healed collar incision Lungs clear with equal breath sounds bilaterally, no wheezes Cardiac regular rate and rhythm normal S1 and S2  Diagnostic Tests: PET/CT 08/14/2011  IMPRESSION:  Two adjacent pulmonary nodules in the posterior lingula  are  hypermetabolic, consistent with neoplasm.  Tiny soft tissue nodule in the left supraclavicular region also  shows increased F D G uptake. The patient is status post  thyroidectomy in this is in close proximity to the left thyroid  bed. While metastatic involvement of the supraclavicular lymph  node can certainly have this appearance, F D G uptake within some  residual thyroid tissue would also be a possibility. No evidence  for hypermetabolic metastases in the hilar regions or mediastinum.  Pulmonary function testing 08/20/2011 FVC 1.99 (81% predicted)  FEV1 1.48 (81% predicted) FEF 25-75% 76% predicted DLCO uncorrected 41%, corrected 235% (unconfirmed)  Impression: 78 year old woman with a history of thyroid and cervical cancer, as well as a moderate smoking history, with a 2.6 cm left upper lobe mass that is  hypermetabolic by PET CT. This actually is to adjacent nodules which are both hypermetabolic. SUV max is 23. This is highly suspicious for primary lung cancer.  I spent been a great deal of time with Krista [name] and her daughter explaining the implications of these findings. They understand this most likely is a new lung cancer. There is no sign of hilar or mediastinal adenopathy or distant metastases. Therefore is potentially curable with surgical resection. I discussed in detail with them the nature of the surgery including the need for general anesthesia, incisions be used, expected hospital stay, and the overall recovery. We also discussed the indications, risks, benefits and alternatives. She understands the risks of surgery include but are not limited to death, stroke, MI, DVT, PE, bleeding, possible need for transfusion, infection, air leaks, cardiac arrhythmias, as well as other unpredictable complications.  [name] is very reluctant to consider surgery. She wishes to me with radiation oncologist to discuss potential treatment with radiation and/or chemotherapy. She understands that surgery offers the best chance for cure.  I recommended to her that since she wants take that approach that we should do an electromagnetic navigational bronchoscopy to make a diagnosis. She understands that this is done under general anesthesia in the operating room, but does not involve any incisions and is done on an outpatient basis. She will need a new CT of the chest using super dimension protocol prior to the procedure. She understands the risk of procedure that related for general anesthesia, bleeding, pneumothorax, nondiagnostic biopsies.  She could not tolerate having an MR of the brain. To schedule her for a CT of the head with contrast to rule out brain metastases  Plan: CT of brain with contrast Repeat CT of chest was super dimension protocol in preparation for ENB ENB as outpatient on Wednesday,  09/04/2011. Once we have a definitive diagnosis I will once again offer her the option of surgical resection. In the meantime we will have her seen by radiation oncology says she will have a better idea of what the has to offer.

## 2011-09-04 NOTE — Interval H&P Note (Signed)
History and Physical Interval Note:  09/04/2011 11:09 AM  Krista [name]  has presented today for surgery, with the diagnosis of LUNG MASS  The various methods of treatment have been discussed with the patient and family. After consideration of risks, benefits and other options for treatment, the patient has consented to  Procedure(s) (LRB): VIDEO BRONCHOSCOPY WITH ENDOBRONCHIAL NAVIGATION (N/A) as a surgical intervention .  The patient's history has been reviewed, patient examined, no change in status, stable for surgery.  I have reviewed the patients' chart and labs.  Questions were answered to the patient's satisfaction.     HENDRICKSON,STEVEN C

## 2011-09-04 NOTE — Transfer of Care (Signed)
Immediate Anesthesia Transfer of Care Note  Patient: Krista [name]  Procedure(s) Performed: Procedure(s) (LRB): VIDEO BRONCHOSCOPY WITH ENDOBRONCHIAL NAVIGATION (N/A)  Patient Location: PACU  Anesthesia Type: General  Level of Consciousness: awake and alert   Airway & Oxygen Therapy: Patient Spontanous Breathing and Patient connected to face mask oxygen  Post-op Assessment: Report given to PACU RN and Post -op Vital signs reviewed and stable  Post vital signs: Reviewed and stable  Complications: No apparent anesthesia complications

## 2011-09-04 NOTE — Anesthesia Postprocedure Evaluation (Signed)
Anesthesia Post Note  Patient: Krista [name]  Procedure(s) Performed: Procedure(s) (LRB): VIDEO BRONCHOSCOPY WITH ENDOBRONCHIAL NAVIGATION (N/A)  Anesthesia type: general  Patient location: PACU  Post pain: Pain level controlled  Post assessment: Patient's Cardiovascular Status Stable  Last Vitals:  Filed Vitals:   09/04/11 1400  BP: 145/82  Pulse: 80  Temp: 37.2 C  Resp: 14    Post vital signs: Reviewed and stable  Level of consciousness: sedated  Complications: No apparent anesthesia complications

## 2011-09-04 NOTE — Brief Op Note (Signed)
09/04/2011  1:13 PM  PATIENT:  Krista [name]  78 y.o. female  PRE-OPERATIVE DIAGNOSIS:  LUNG MASS  POST-OPERATIVE DIAGNOSIS:  lung mass  PROCEDURE:  Procedure(s) (LRB): VIDEO BRONCHOSCOPY WITH ENDOBRONCHIAL NAVIGATION, brushings, biopsies and BAL washings  SURGEON:  Surgeon(s) and Role:    * Loreli Slot, MD - Primary  ASSISTANTS: none   ANESTHESIA:   general  EBL:  Total I/O In: 1000 [I.V.:1000] Out: -   PLAN OF CARE: Discharge to home after PACU  PATIENT DISPOSITION:  PACU - hemodynamically stable.   Delay start of Pharmacological VTE agent (>24hrs) due to surgical blood loss or risk of bleeding: not applicable  Quick prep- probable nonsmall cell cancer

## 2011-09-05 NOTE — Op Note (Signed)
NAME:  Krista [name], Krista [name]              ACCOUNT NO.:  [id number]  MEDICAL RECORD NO.:  [id number]  LOCATION:  [facility]                         FACILITY:  [facility]  PHYSICIAN:  Salvatore Decent. Dorris Fetch, M.D.DATE OF BIRTH:  [birth year]  DATE OF PROCEDURE:  09/04/2011 DATE OF DISCHARGE:  09/04/2011                              OPERATIVE REPORT   PREOPERATIVE DIAGNOSIS:  Left upper lobe mass.  POSTOPERATIVE DIAGNOSIS:  Left upper lobe mass, probable non-small cell carcinoma.  PROCEDURE:  Flexible fiberoptic videobronchoscopy with electromagnetic navigational bronchoscopy with brushings, biopsies, and bronchoalveolar lavage and washings.  SURGEON:  Salvatore Decent. Dorris Fetch, MD  ANESTHESIA:  General.  FINDINGS:  Initial Quick Prep specimen revealed inflammatory changes Areas of likely non-small-cell carcinoma, definitive diagnosis awaiting permanent section.  CLINICAL NOTE:  Krista [name] is a 78 year old woman recently found to have a mass in the left upper lobe.  She was reluctant to consider surgical resection and wished to consider radiation therapy.  Prior to that, she would need a diagnosis and she was advised to undergo bronchoscopy and electromagnetic navigational bronchoscopy for diagnostic purposes.  The indications, risks, benefits, and alternatives were discussed in detail with the patient as outlined in the office note.  She understood and accepted the risks of the procedure and wished to proceed.  OPERATIVE NOTE:  Krista [name] was brought to the preop holding area on September 04, 2011, there, Anesthesia established intravenous access.  She was taken to the operating room, anesthetized, and intubated.  Flexible fiberoptic bronchoscopy was performed.  There were some thick clear secretions, these were evacuated.  There was normal endobronchial anatomy and no endobronchial lesions were seen to the level of the subsegmental bronchi.  The locatable guide then was advanced through the bronchoscope  and initial mapping was performed.  The bronchoscope then was advanced into the lingular segmental bronchus just to the orifice and the locatable guide was used to identify the appropriate subsegmental orifice for biopsy.  It indicated good positioning and proximity to the mass.  Four passes were performed with a needle brush.  The locatable guide was replaced to ensure that the sheath was still in appropriate position. After confirming this, multiple biopsies were taken.  Then, regular brushings were performed as well.  The first and last specimens were sent for Quick Prep, which showed inflammatory changes and some Small areas of likely malignancy, but the definitive diagnosis could not be determined by the Quick Prep.  Additional brushings and biopsies were performed as were additional needle aspirations.  Next, the second adjacent mass which had been likewise mapped preoperatively was selected as the target and the process repeated for this target as well.  All the remaining specimens were sent for permanent pathology.  The bronchoscope was removed.  The patient was awakened and extubated in the operating room and taken to the postanesthetic care unit in good condition.     Salvatore Decent Dorris Fetch, M.D.     SCH/MEDQ  D:  09/04/2011  T:  09/05/2011  Job:  [id number]

## 2011-09-07 LAB — CULTURE, RESPIRATORY W GRAM STAIN: Gram Stain: NONE SEEN

## 2011-09-10 ENCOUNTER — Ambulatory Visit (INDEPENDENT_AMBULATORY_CARE_PROVIDER_SITE_OTHER): Payer: Medicare Other | Admitting: Thoracic Surgery (Cardiothoracic Vascular Surgery)

## 2011-09-10 ENCOUNTER — Other Ambulatory Visit: Payer: Self-pay | Admitting: Radiation Oncology

## 2011-09-10 ENCOUNTER — Encounter: Payer: Self-pay | Admitting: Thoracic Surgery (Cardiothoracic Vascular Surgery)

## 2011-09-10 VITALS — BP 150/66 | HR 67 | Resp 18 | Ht 62.0 in | Wt 113.0 lb

## 2011-09-10 DIAGNOSIS — C3490 Malignant neoplasm of unspecified part of unspecified bronchus or lung: Secondary | ICD-10-CM

## 2011-09-10 DIAGNOSIS — Z9889 Other specified postprocedural states: Secondary | ICD-10-CM

## 2011-09-10 NOTE — Progress Notes (Signed)
  HPI:  Mrs. Shifflett returns today to discuss results from her ENB last week.  She states that the evening after surgery she felt very restless and her whole body was shaking. She felt "like she was coming out of her skin". She took some Benadryl and was able sleep that night. She had some similar feelings next morning but has subsequently resolved. She is very anxious about her results.  Surgical pathology revealed squamous cell carcinoma.   Current Outpatient Prescriptions  Medication Sig Dispense Refill  . alendronate (FOSAMAX) 70 MG tablet Take 70 mg by mouth every 7 (seven) days. Take with a full glass of water on an empty stomach.      Marland Kitchen aspirin 81 MG tablet Take 81 mg by mouth daily.      . budesonide-formoterol (SYMBICORT) 160-4.5 MCG/ACT inhaler Inhale 2 puffs into the lungs 2 (two) times daily.      . Calcium-Magnesium-Vitamin D (CALCIUM MAGNESIUM PO) Take 1 tablet by mouth daily.      . Levothyroxine Sodium 75 MCG CAPS Take 75 mcg by mouth daily.       . meclizine (ANTIVERT) 25 MG tablet Take 25 mg by mouth 3 (three) times daily.       . metFORMIN (GLUCOPHAGE) 500 MG tablet Take 500 mg by mouth 2 (two) times daily with a meal.       . pantoprazole (PROTONIX) 40 MG tablet Take 40 mg by mouth daily.      . rosuvastatin (CRESTOR) 20 MG tablet Take 20 mg by mouth daily.        Physical Exam BP 150/66  Pulse 67  Resp 18  Ht 5\' 2"  (1.575 m)  Wt 113 lb (51.256 kg)  BMI 20.67 kg/m2  SpO2 100% Exam unchanged  Diagnostic Tests: Pathology-squamous cell carcinoma  Impression: 78 year old woman with non-small cell carcinoma in the left upper lobe, lingular segment.  She is rather frail and very anxious. However her pulmonary function testing is adequate to tolerate a surgical resection, with an FEV1 of 1.51 (83% of predicted). Her diffusion capacity is somewhat limited at 45%, but is not prohibitive. I do think at her age she would be at relatively high risk for morbidity and or  mortality. I recommended that the surgical resection is her best option given the size and location of the tumor. She has no local regional or distant metastases by PET. I discussed with the patient her daughter the indications, risks, benefits, and alternatives. They understand that the risks of surgery include death (2-3%), stroke, MI, DVT, PE, bleeding, possible need for transfusions, infections, respiratory failure, as well as other unpredictable or unforeseeable complications.  She is very reluctant to consider surgical resection. She wishes to me with oncology and radiation oncology discuss other options. We will arrange for her to see oncology and radiation oncology this Thursday at [facility]. I was clear that although there are alternative treatments, they are not necessarily equivalent, and that surgery still offered the best chance of cure.  Plan: Appointments with radiation oncology and oncology Thursday, 09/12/2011  I will meet with her again after she's had a chance to talk with those physicians

## 2011-09-11 ENCOUNTER — Encounter: Payer: Self-pay | Admitting: Radiation Oncology

## 2011-09-11 ENCOUNTER — Telehealth: Payer: Self-pay | Admitting: *Deleted

## 2011-09-11 NOTE — Telephone Encounter (Signed)
Spoke with pt regarding appt time and place.  She verbalized understanding of time and place. Staring appt here at cancer center at 11:30

## 2011-09-11 NOTE — Progress Notes (Signed)
78 year old female.  Former smoker.   Non-small cell carcinoma in the left upper lobe lung. Dr. Dorris Fetch recommends surgical resection but, patient is very reluctant.  [id number] and sulfonamide derivatives cause nausea No indication of a pacemaker No radiation therapy in the past  Tuberculosis in 1965, thyroid ca tx with sx in 2008, and cervical ca tx with sx and chemo in 2005

## 2011-09-12 ENCOUNTER — Encounter: Payer: Self-pay | Admitting: Internal Medicine

## 2011-09-12 ENCOUNTER — Encounter: Payer: Self-pay | Admitting: *Deleted

## 2011-09-12 ENCOUNTER — Ambulatory Visit
Admission: RE | Admit: 2011-09-12 | Discharge: 2011-09-12 | Disposition: A | Discharge status: Home / Self Care | Payer: Medicare Other | Source: Ambulatory Visit | Attending: Radiation Oncology | Admitting: Radiation Oncology

## 2011-09-12 ENCOUNTER — Encounter: Payer: Self-pay | Admitting: Radiation Oncology

## 2011-09-12 ENCOUNTER — Ambulatory Visit (HOSPITAL_BASED_OUTPATIENT_CLINIC_OR_DEPARTMENT_OTHER): Payer: Medicare Other | Admitting: Internal Medicine

## 2011-09-12 VITALS — BP 158/84 | HR 65 | Temp 98.0°F | Ht 62.0 in | Wt 110.0 lb

## 2011-09-12 VITALS — BP 158/84 | HR 65 | Temp 98.0°F | Resp 18 | Ht 62.0 in | Wt 110.6 lb

## 2011-09-12 DIAGNOSIS — C3410 Malignant neoplasm of upper lobe, unspecified bronchus or lung: Secondary | ICD-10-CM

## 2011-09-12 DIAGNOSIS — C3490 Malignant neoplasm of unspecified part of unspecified bronchus or lung: Secondary | ICD-10-CM

## 2011-09-12 DIAGNOSIS — E119 Type 2 diabetes mellitus without complications: Secondary | ICD-10-CM | POA: Insufficient documentation

## 2011-09-12 DIAGNOSIS — K219 Gastro-esophageal reflux disease without esophagitis: Secondary | ICD-10-CM | POA: Insufficient documentation

## 2011-09-12 DIAGNOSIS — Z79899 Other long term (current) drug therapy: Secondary | ICD-10-CM | POA: Insufficient documentation

## 2011-09-12 DIAGNOSIS — I10 Essential (primary) hypertension: Secondary | ICD-10-CM | POA: Insufficient documentation

## 2011-09-12 NOTE — Progress Notes (Signed)
Complete PATIENT MEASURE OF DISTRESS worksheet with a score of 5 submitted to social work. Patient denies need to see social work today since she has other appointment but, has left her contact number on worksheet.

## 2011-09-12 NOTE — Progress Notes (Signed)
Spoke with pt and family at [facility] today in rad onc and med onc offices.  Educational/resource information and explanation given.

## 2011-09-12 NOTE — Progress Notes (Signed)
Correction from prior entry: Treatment for cervical cancer in 2005 included complete hysterectomy and radiation but, no chemo therapy.  Received patient and several family members in the clinic today for consultation to discuss the role of radiation therapy in the treatment of lung ca. Patient is alert and oriented to person, place, and time. No distress noted. Steady gait noted. Pleasant affect noted. Patient denies pain at this time. Patient reports weight is stable. Patient reports energy level is normal. Patient reports shortness of breath only with exertion. Patient's family reports she "tires out pretty easy." Patient reports an occasional productive cough with blood tinged sputum but, only one or two episodes since biopsy. Patient reports that she can lay supine without dyspnea. Patient denies nausea, vomiting and headache. Patient reports occasional dizziness. Patient reports occasional diarrhea. Patient reports normal good appetite. Reported all findings to Dr. Michell Heinrich.    Patient has expressed desire to be treated in [place].

## 2011-09-12 NOTE — Progress Notes (Signed)
[facility] [phone]   [phone]  CONSULT NOTE  REASON FOR CONSULTATION:  78 years old white female diagnosed with lung cancer.  HPI Krista [name] is a 78 y.o. female was past medical history significant for hypertension, diabetes mellitus, dyslipidemia, osteopenia, history of thyroid cancer status post resection in 2008 as well as history of tuberculosis in 1965. The patient mentions that in May of 2013 she has been complaining of cough with blood-tinged sputum. She was seen by her primary care physician Dr. Juanetta Gosling and chest x-ray was performed on 07/17/2011 and it showed a nodular mass measuring 2.4 CM in the retrocardiac region of the left lower lobe. This was followed by CT scan of the chest on 07/22/2011 and it showed 2.6 x 1.5 x 2.2 cm diameter soft tissue mass with smooth lobulated margins within lingula adjacent to the left heart border, question metastatic disease in patient with history of cervical and thyroid cancer. The patient was seen by Dr. Dorris Fetch and a PET scan was performed on 08/14/2011 and it showed Two adjacent pulmonary nodules in the posterior lingula are hypermetabolic, consistent with neoplasm.  Tiny soft tissue nodule in the left supraclavicular region also shows increased F D G uptake. The patient is status post thyroidectomy in this is in close proximity to the left thyroid bed. While metastatic involvement of the supraclavicular lymph  node can certainly have this appearance, F D G uptake within some residual thyroid tissue would also be a possibility. No evidence for hypermetabolic metastases in the hilar regions or mediastinum. On 09/04/2011 the patient underwent flexible fiberoptic video bronchoscopy with electromagnetic navigational bronchoscopy with brushings, biopsies and bronchoalveolar lavage and washings. The final pathology was consistent with squamous cell carcinoma. Dr. Dorris Fetch discussed with the patient  surgical resection but she declined that option. She was referred to the multidisciplinary thoracic oncology clinic for evaluation and discussion of her other treatment options. The patient was seen earlier today by Dr. Michell Heinrich for consideration of curative radiotherapy to her lingular lung lesion. She is also here for evaluation and discussion of chemotherapy. The patient is feeling fine with no specific complaints except for essential tremors in the right hand. She denied having any significant chest pain or shortness breath, no cough or hemoptysis. She has no weight loss or night sweats. The patient is a widow and has 4 children. She has remote history of smoking but quit 25 years ago. No history of alcohol or drug abuse.   @SFHPI @  Past Medical History  Diagnosis Date  . CA - cancer     thyroid - 2008 surg  . Hyperlipidemia   . Osteoporosis   . Pneumonia 5/13  . GERD (gastroesophageal reflux disease)   . History of migraine headaches   . Vertigo   . History of tuberculosis 1965  . Thyroid disease 2008    thyroid cancer cured with surgery  . Lung cancer 09/04/2011    non-small cell ca in L upper lobe  . Hypertension   . Diabetes mellitus     controlled with medication  . Cancer     cervical ca - 2005 - surg and radiation    Past Surgical History  Procedure Date  . Thyroidectomy 2007  . Hip arthroplasty 2010    left  . Cholecystectomy 1994  . Cataract extraction   . Lung biopsy 09/04/2011  . Abdominal hysterectomy 2005    complete    Family History  Problem Relation Age of  Onset  . Lung disease    . Cancer Sister     breast  . Cancer Sister     brain  . Cancer Sister     ovarian     Social History History  Substance Use Topics  . Smoking status: Former Smoker -- 0.2 packs/day for 20 years    Types: Cigarettes    Quit date: 03/04/1982  . Smokeless tobacco: Never Used  . Alcohol Use: No    Allergies  Allergen Reactions  . Amoxicillin     REACTION: Nausea    . Sulfonamide Derivatives     REACTION: Nausea    Current Outpatient Prescriptions  Medication Sig Dispense Refill  . alendronate (FOSAMAX) 70 MG tablet Take 70 mg by mouth every 7 (seven) days. Take with a full glass of water on an empty stomach.      Marland Kitchen aspirin 81 MG tablet Take 81 mg by mouth daily.      . budesonide-formoterol (SYMBICORT) 160-4.5 MCG/ACT inhaler Inhale 2 puffs into the lungs 2 (two) times daily.      . Calcium-Magnesium-Vitamin D (CALCIUM MAGNESIUM PO) Take 1 tablet by mouth daily.      . Levothyroxine Sodium 75 MCG CAPS Take 75 mcg by mouth daily.       . meclizine (ANTIVERT) 25 MG tablet Take 25 mg by mouth 3 (three) times daily.       . metFORMIN (GLUCOPHAGE) 500 MG tablet Take 500 mg by mouth 2 (two) times daily with a meal.       . pantoprazole (PROTONIX) 40 MG tablet Take 40 mg by mouth daily.      . rosuvastatin (CRESTOR) 20 MG tablet Take 20 mg by mouth daily.        Review of Systems  A comprehensive review of systems was negative.  Physical Exam  ZOX:WRUEA, healthy, no distress, well nourished and well developed SKIN: skin color, texture, turgor are normal HEAD: Normocephalic EYES: normal EARS: External ears normal OROPHARYNX:no exudate and no erythema  NECK: supple, no adenopathy LYMPH:  no palpable lymphadenopathy, no hepatosplenomegaly BREAST:not examined LUNGS: clear to auscultation  HEART: regular rate & rhythm, no murmurs and no gallops ABDOMEN:abdomen soft and non-tender BACK: Back symmetric, no curvature. EXTREMITIES:no joint deformities, effusion, or inflammation, no edema, no skin discoloration, no clubbing, no cyanosis  NEURO: alert & oriented x 3 with fluent speech, no focal motor/sensory deficits, gait normal  PERFORMANCE STATUS: ECOG 1  LABORATORY DATA: Lab Results  Component Value Date   WBC 8.4 08/28/2011   HGB 10.9* 08/28/2011   HCT 32.1* 08/28/2011   MCV 89.4 08/28/2011   PLT 345 08/28/2011      Chemistry      Component  Value Date/Time   NA 138 08/28/2011 1445   K 4.8 08/28/2011 1445   CL 101 08/28/2011 1445   CO2 26 08/28/2011 1445   BUN 18 08/28/2011 1445   CREATININE 1.51* 08/28/2011 1445      Component Value Date/Time   CALCIUM 9.8 08/28/2011 1445   ALKPHOS 72 08/28/2011 1445   AST 19 08/28/2011 1445   ALT 13 08/28/2011 1445   BILITOT 0.3 08/28/2011 1445       RADIOGRAPHIC STUDIES: Dg Chest 2 View Within Previous 72 Hours.  Films Obtained On Friday Are Acceptable For Monday And Tuesday Cases  09/04/2011  *RADIOLOGY REPORT*  Clinical Data: 2 lingular lung nodules, preoperative assessment for VATS, history diabetes, pneumonia, GERD, TB  CHEST - 2 VIEW  Comparison:  07/17/2011  Findings: Upper-normal size of cardiac silhouette. Calcified aortic arch. Mediastinal contours and pulmonary vascularity normal. 2.1 x 2.2 x 2.3 cm diameter density projects over the left heart border corresponding to known lingular nodule, unchanged. Emphysematous changes without infiltrate or pleural effusion. No pneumothorax. Osseous demineralization.  IMPRESSION: Changes of COPD with a 2.2 x 2.1 x 2.3 cm diameter lingular nodule  Original Report Authenticated By: Lollie Marrow, M.D.   Ct Head W Wo Contrast  08/23/2011  *RADIOLOGY REPORT*  Clinical Data: Lung mass.  Evaluate for metastatic disease.  CT HEAD WITHOUT AND WITH CONTRAST  Technique:  Contiguous axial images were obtained from the base of the skull through the vertex without and with intravenous contrast.  Contrast: OMNIPAQUE IOHEXOL 300 MG/ML  SOLN  Comparison: Head CT 11/09/2004.  Findings: The ventricles are normal.  No extra-axial fluid collections are seen.  The brainstem and cerebellum are unremarkable.  No acute intracranial findings such as infarction or hemorrhage.  No mass lesions.  No areas of abnormal contrast enhancement are identified to suggest intracranial metastatic disease.  The major vascular structures are patent including the dural venous sinuses.  No large  aneurysm is identified.  The bony calvarium is intact.  The visualized paranasal sinuses and mastoid air cells are clear.  IMPRESSION: No acute intracranial findings. No CT findings for intracranial metastatic disease.  Original Report Authenticated By: P. Loralie Champagne, M.D.   Nm Pet Image Initial (pi) Skull Base To Thigh  08/14/2011  *RADIOLOGY REPORT*  Clinical Data:  Recent imaging demonstrates a solitary pulmonary nodule.  FDG PET CT requested to evaluate for possible malignancy.  NUCLEAR MEDICINE PET SKULL BASE TO THIGH  Fasting Blood Glucose:  100  Technique:  20 mCi F-18 FDG was injected intravenously. CT data was obtained and used for attenuation correction and anatomic localization only.  (This was not acquired as a diagnostic CT examination.) Additional exam technical data entered on technologist worksheet.  Comparison:  Chest CT from 07/22/2011.  Findings:  Head/Neck:  6 mm short axis lymph node identified previously in the supraclavicular region, near the left thyroidectomy bed, and is hypermetabolic with SUV max = 5.  No other hypermetabolic disease is identified within the neck.  Chest:  The 2.6 cm soft tissue nodule in the posterior lingula is hypermetabolic with SUV max = 23.  A second to a more ill-defined area of soft tissue attenuation is seen immediately cranial and medial to the dominant nodule.  This second smaller area of soft tissue attenuation in the lingula is also hypermetabolic. Together, these two nodules create a bilobed appearance of the hypermetabolic activity on PET imaging.  Other scattered tiny pulmonary parenchymal nodules seen on the previous chest CT are below the accepted size threshold for resolution on PET imaging. There is no evidence for mediastinal or hilar hypermetabolic disease.  Abdomen/Pelvis:  No abnormal hypermetabolic activity within the liver, pancreas, adrenal glands, or spleen.  No hypermetabolic lymph nodes in the abdomen or pelvis.  4.7 cm lesion in the  anterior right kidney measures water density and shows no hypermetabolism on the PET imaging.  A patulous loop of small bowel is identified in the right lower quadrant which contains fluid/debris.  This was present on a previous CT from 01/12/2010 and has not changed substantially in the interval.  There present an area of denervation and small bowel or be secondary to a chronic incomplete stricture.  Skeleton:  No focal hypermetabolic activity to suggest skeletal metastasis.  Left total hip replacement again noted.  IMPRESSION:  Two adjacent pulmonary nodules in the posterior lingula are hypermetabolic, consistent with neoplasm.  Tiny soft tissue nodule in the left supraclavicular region also shows increased F D G uptake.  The patient is status post thyroidectomy in this is in close proximity to the left thyroid bed.  While metastatic involvement of the supraclavicular lymph node can certainly have this appearance, F D G uptake within some residual thyroid tissue would also be a possibility.  No evidence for hypermetabolic metastases in the hilar regions or mediastinum.1  Original Report Authenticated By: ERIC A. MANSELL, M.D.   Ct Super D Chest Wo Contrast  08/23/2011  *RADIOLOGY REPORT*  Clinical Data:  CT CHEST WITHOUT CONTRAST  Technique:  Multidetector CT imaging of the chest was performed using thin slice collimation for electromagnetic bronchoscopy planning purposes, without intravenous contrast.  Contrast: OMNIPAQUE IOHEXOL 300 MG/ML  SOLN  Comparison:  Chest CT 07/22/2011 and PET CT 08/14/2011.  Findings:  The chest wall is stable.  No supraclavicular or axillary lymphadenopathy.  The bony thorax is intact.  No destructive bone lesions or spinal canal compromise.  The heart is normal in size.  No pericardial effusion.  No mediastinal or hilar lymphadenopathy.  Small scattered lymph nodes are noted.  The aorta is normal in caliber.  Mild atherosclerotic calcifications.  Coronary artery  calcifications are noted.  Examination of the lung parenchyma demonstrates stable bilobed mass in the lingula.  No new pulmonary nodules.  Stable small pleural lipoma on the right side.  No pleural effusion.  The upper abdomen is unremarkable.  A right renal cyst is noted.  IMPRESSION: Stable bilobed mass in the lingula without mediastinal or hilar adenopathy.  Original Report Authenticated By: P. Loralie Champagne, M.D.   Dg C-arm Bronchoscopy  09/04/2011  CLINICAL DATA: navigation procedure   C-ARM BRONCHOSCOPY  Fluoroscopy was utilized by the requesting physician.  No radiographic  interpretation.      ASSESSMENT: This is a very pleasant 78 years old white female recently diagnosed with a stage IA (T1b., N0, M0) non-small cell lung cancer, squamous cell carcinoma.  PLAN: I have a lengthy discussion with the patient and her family today about her current disease stage, prognosis and treatment options. I explained to the patient and her family that surgical resection is the preferred and best option for treatment of her current disease stage, but the patient is not interested in proceeding with surgical resection because of concern about complications. I also explained to her that the second best option would be curative radiotherapy. The patient was seen earlier today by Dr. Michell Heinrich for discussion of this option. She is interested in proceeding with radiotherapy. I do think there would be a value of for concurrent radiotherapy for her early stage of the disease and the patient is not also interested in chemotherapy at this point. Will consider this option if she has any evidence for metastatic or recurrent disease.  I gave the patient and her family the time to ask questions and I answered them completely to their satisfaction. The patient will continue with routine followup visit with Dr. Michell Heinrich after completion of the curative radiotherapy, but I would be happy to see her in the future if she has  any evidence for disease recurrence.  All questions were answered. The patient knows to call the clinic with any problems, questions or concerns. We can certainly see the patient much sooner if necessary.  Thank you so  much for allowing me to participate in the care of Krista [name]. I will continue to follow up the patient with you and assist in her care.  I spent 30 minutes counseling the patient face to face. The total time spent in the appointment was 55 minutes.  Sadira Standard K. 09/12/2011, 1:39 PM

## 2011-09-12 NOTE — Progress Notes (Signed)
See progress note under physician encounter. 

## 2011-09-12 NOTE — Progress Notes (Signed)
[facility] Brief Psychosocial Assessment Clinical Social Work  Clinical Social Work met with patient, patient's daughters, Systems developer, and thoracic navigator during MTOC today.  Dr. Arbutus Ped discussed patient's diagnosis and treatment plan. Ms. Audi agreed with plan for radiation and would like to be treated by Dr. Roselind Messier in [place].  The patient shared feeling relieved that she will not be receiving chemotherapy at this time.  She reports minimal distress and feeling very supported by her family. CSW encouraged her to call with any additional questions or concerns.  Kathrin Penner, MSW, [facility] Clinical Social Worker [facility] [phone]

## 2011-09-12 NOTE — Progress Notes (Signed)
Patient came in today as a new patient with her daughters and she has two insurance,so her daughter said that she should be [place] far as Corporate investment banker.

## 2011-09-15 NOTE — Progress Notes (Signed)
Radiation Oncology         [phone]) [phone] ________________________________  Initial outpatient Consultation  Name: [name] MRN: [id number]  Date: 09/12/2011  DOB: 03-20-1931  REFERRING PHYSICIAN: Loreli Slot, *  DIAGNOSIS: The encounter diagnosis was Lung cancer.  HISTORY OF PRESENT ILLNESS::Krista [name] is a 78 y.o. female who began to complain of cough with blood-tinged sputum in May of of this year. She was seen by her primary care physician Dr. Juanetta Gosling and chest x-ray was performed on 07/17/2011 and it showed a nodular mass measuring 2.4 CM in the retrocardiac region of the left lower lobe. This was followed by CT scan of the chest on 07/22/2011 which showed 2.6 x 1.5 x 2.2 cm mass. Given her history of cervical and thyroid cancer metastatic disease could not be excluded. The patient was seen by Dr. Dorris Fetch and a PET scan was performed on 08/14/2011 and it showed 2 adjacent pulmonary nodules in the posterior left upper lobe which were felt to be hypermetabolic consistent with neoplasm. A small soft tissue nodule in the left neck was also noted and concerning for a hypermetabolic lymph node or possible thyroid cancer recurrence. Further workup was recommended.  No evidence of mediastinal hilar or distant metastatic disease was noted. On 09/04/2011 the patient underwent biopsy of these masses with final pathology consistent with squamous cell carcinoma. Dr. Dorris Fetch offered her surgical resection. She is very reluctant to undergo surgery and asked to be referred for discussion of alternative treatment options. discussed with the patient surgical resection but she declined that option. She was referred to the multidisciplinary thoracic oncology clinic for evaluation and discussion of her other treatment options. She denied having any significant chest pain or shortness breath, no cough or hemoptysis. She has no weight loss or night sweats. She has had no further  hemoptysis.  PREVIOUS RADIATION THERAPY: Yes she was treated with surgery followed by external beam radiation therapy for cervical cancer in 2005. She was treated by Dr. Roselind Messier in [place]. She also received I-131 after her thyroid cancer in 2008. She received this in [place].  PAST MEDICAL HISTORY:  has a past medical history of CA - cancer; Hyperlipidemia; Osteoporosis; Pneumonia (5/13); GERD (gastroesophageal reflux disease); History of migraine headaches; Vertigo; History of tuberculosis (1965); Thyroid disease (2008); Lung cancer (09/04/2011); Hypertension; Diabetes mellitus; and Cancer.    PAST SURGICAL HISTORY: Past Surgical History  Procedure Date  . Thyroidectomy 2007  . Hip arthroplasty 2010    left  . Cholecystectomy 1994  . Cataract extraction   . Lung biopsy 09/04/2011  . Abdominal hysterectomy 2005    complete    FAMILY HISTORY: family history includes Cancer in her sisters and Lung disease in an unspecified family member.  SOCIAL HISTORY:  reports that she quit smoking about 29 years ago. Her smoking use included Cigarettes. She has a 5 pack-year smoking history. She has never used smokeless tobacco. She reports that she does not drink alcohol or use illicit drugs.  ALLERGIES: Amoxicillin and Sulfonamide derivatives  MEDICATIONS:  Current Outpatient Prescriptions  Medication Sig Dispense Refill  . alendronate (FOSAMAX) 70 MG tablet Take 70 mg by mouth every 7 (seven) days. Take with a full glass of water on an empty stomach.      Marland Kitchen aspirin 81 MG tablet Take 81 mg by mouth daily.      . budesonide-formoterol (SYMBICORT) 160-4.5 MCG/ACT inhaler Inhale 2 puffs into the lungs 2 (two) times daily.      Marland Kitchen  Calcium-Magnesium-Vitamin D (CALCIUM MAGNESIUM PO) Take 1 tablet by mouth daily.      . Levothyroxine Sodium 75 MCG CAPS Take 75 mcg by mouth daily.       . meclizine (ANTIVERT) 25 MG tablet Take 25 mg by mouth 3 (three) times daily.       . metFORMIN (GLUCOPHAGE) 500 MG tablet  Take 500 mg by mouth 2 (two) times daily with a meal.       . pantoprazole (PROTONIX) 40 MG tablet Take 40 mg by mouth daily.      . rosuvastatin (CRESTOR) 20 MG tablet Take 20 mg by mouth daily.        REVIEW OF SYSTEMS:  A 15 point review of systems is documented in the electronic medical record. This was obtained by the nursing staff. However, I reviewed this with the patient to discuss relevant findings and make appropriate changes.     PHYSICAL EXAM:  height is 5\' 2"  (1.575 m) and weight is 110 lb 9.6 oz (50.168 kg). Her oral temperature is 98 F (36.7 C). Her blood pressure is 158/84 and her pulse is 65. Her respiration is 18 and oxygen saturation is 100%.   . She is alert and oriented x3. She is in no respiratory distress.  LABORATORY DATA:  Lab Results  Component Value Date   WBC 8.4 08/28/2011   HGB 10.9* 08/28/2011   HCT 32.1* 08/28/2011   MCV 89.4 08/28/2011   PLT 345 08/28/2011   Lab Results  Component Value Date   NA 138 08/28/2011   K 4.8 08/28/2011   CL 101 08/28/2011   CO2 26 08/28/2011   Lab Results  Component Value Date   ALT 13 08/28/2011   AST 19 08/28/2011   ALKPHOS 72 08/28/2011   BILITOT 0.3 08/28/2011     RADIOGRAPHY: Dg Chest 2 View Within Previous 72 Hours.  Films Obtained On Friday Are Acceptable For Monday And Tuesday Cases  09/04/2011  *RADIOLOGY REPORT*  Clinical Data: 2 lingular lung nodules, preoperative assessment for VATS, history diabetes, pneumonia, GERD, TB  CHEST - 2 VIEW  Comparison: 07/17/2011  Findings: Upper-normal size of cardiac silhouette. Calcified aortic arch. Mediastinal contours and pulmonary vascularity normal. 2.1 x 2.2 x 2.3 cm diameter density projects over the left heart border corresponding to known lingular nodule, unchanged. Emphysematous changes without infiltrate or pleural effusion. No pneumothorax. Osseous demineralization.  IMPRESSION: Changes of COPD with a 2.2 x 2.1 x 2.3 cm diameter lingular nodule  Original Report Authenticated  By: Lollie Marrow, M.D.   Ct Head W Wo Contrast  08/23/2011  *RADIOLOGY REPORT*  Clinical Data: Lung mass.  Evaluate for metastatic disease.  CT HEAD WITHOUT AND WITH CONTRAST  Technique:  Contiguous axial images were obtained from the base of the skull through the vertex without and with intravenous contrast.  Contrast: OMNIPAQUE IOHEXOL 300 MG/ML  SOLN  Comparison: Head CT 11/09/2004.  Findings: The ventricles are normal.  No extra-axial fluid collections are seen.  The brainstem and cerebellum are unremarkable.  No acute intracranial findings such as infarction or hemorrhage.  No mass lesions.  No areas of abnormal contrast enhancement are identified to suggest intracranial metastatic disease.  The major vascular structures are patent including the dural venous sinuses.  No large aneurysm is identified.  The bony calvarium is intact.  The visualized paranasal sinuses and mastoid air cells are clear.  IMPRESSION: No acute intracranial findings. No CT findings for intracranial metastatic disease.  Original  Report Authenticated By: P. Loralie Champagne, M.D.   Ct Super D Chest Wo Contrast  08/23/2011  *RADIOLOGY REPORT*  Clinical Data:  CT CHEST WITHOUT CONTRAST  Technique:  Multidetector CT imaging of the chest was performed using thin slice collimation for electromagnetic bronchoscopy planning purposes, without intravenous contrast.  Contrast: OMNIPAQUE IOHEXOL 300 MG/ML  SOLN  Comparison:  Chest CT 07/22/2011 and PET CT 08/14/2011.  Findings:  The chest wall is stable.  No supraclavicular or axillary lymphadenopathy.  The bony thorax is intact.  No destructive bone lesions or spinal canal compromise.  The heart is normal in size.  No pericardial effusion.  No mediastinal or hilar lymphadenopathy.  Small scattered lymph nodes are noted.  The aorta is normal in caliber.  Mild atherosclerotic calcifications.  Coronary artery calcifications are noted.  Examination of the lung parenchyma demonstrates  stable bilobed mass in the lingula.  No new pulmonary nodules.  Stable small pleural lipoma on the right side.  No pleural effusion.  The upper abdomen is unremarkable.  A right renal cyst is noted.  IMPRESSION: Stable bilobed mass in the lingula without mediastinal or hilar adenopathy.  Original Report Authenticated By: P. Loralie Champagne, M.D.   Dg C-arm Bronchoscopy  09/04/2011  CLINICAL DATA: navigation procedure   C-ARM BRONCHOSCOPY  Fluoroscopy was utilized by the requesting physician.  No radiographic  interpretation.        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 Brittain her family today. We have previously discussed her case in our multidisciplinary thoracic conference. I think she would be a good candidate for radiation alone. We discussed hypofractionated radiation to a total dose of 70.2 gray in 26 treatments using the CALGB regimen. I think she will tolerate this well as the tumor is not close to critical structures such as her esophagus. We discussed the process of simulation the placement tattoos. She wished to be seen by Dr. Roselind Messier and I have scheduled her for a appointment at 9:30 on Thursday to see him with simulation to follow. This appointment was scheduled at [facility]. She will be meeting with Dr. Arbutus Ped regarding the role of chemotherapy. I think given this early stage lesion she did have a control rate as high as 80 or 85% with radiation alone. We did discuss that this was not a size that would be the surgery. She is really not interested in pursuing surgical resection however. We discussed the process of simulation the placement tattoos. We discussed the possibility of lung heart rib and skin damage. We discussed possibility of fatigue. Multiple family members with her and some of them have questions which I answered. He also met with our Child psychotherapist. I spent 60 minutes  face to face with the patient and more than 50% of that time was spent  in counseling and/or coordination of care.   ------------------------------------------------  Lurline Hare, MD

## 2011-09-16 ENCOUNTER — Ambulatory Visit: Payer: Medicare Other | Admitting: Internal Medicine

## 2011-12-24 ENCOUNTER — Other Ambulatory Visit: Payer: Self-pay | Admitting: Radiation Oncology

## 2011-12-24 DIAGNOSIS — C3490 Malignant neoplasm of unspecified part of unspecified bronchus or lung: Secondary | ICD-10-CM

## 2011-12-25 ENCOUNTER — Ambulatory Visit (HOSPITAL_COMMUNITY)
Admission: RE | Admit: 2011-12-25 | Discharge: 2011-12-25 | Disposition: A | Discharge status: Home / Self Care | Payer: Medicare Other | Source: Ambulatory Visit | Attending: Radiation Oncology | Admitting: Radiation Oncology

## 2011-12-25 DIAGNOSIS — E119 Type 2 diabetes mellitus without complications: Secondary | ICD-10-CM | POA: Insufficient documentation

## 2011-12-25 DIAGNOSIS — C3490 Malignant neoplasm of unspecified part of unspecified bronchus or lung: Secondary | ICD-10-CM

## 2011-12-25 DIAGNOSIS — R599 Enlarged lymph nodes, unspecified: Secondary | ICD-10-CM | POA: Insufficient documentation

## 2011-12-25 LAB — POCT I-STAT, CHEM 8
BUN: 26 mg/dL — ABNORMAL HIGH (ref 6–23)
Calcium, Ion: 1.15 mmol/L (ref 1.13–1.30)
Chloride: 112 mEq/L (ref 96–112)
Creatinine, Ser: 1.5 mg/dL — ABNORMAL HIGH (ref 0.50–1.10)
Glucose, Bld: 100 mg/dL — ABNORMAL HIGH (ref 70–99)

## 2011-12-25 MED ORDER — IOHEXOL 300 MG/ML  SOLN
100.0000 mL | Freq: Once | INTRAMUSCULAR | Status: AC | PRN
  Administered 2011-12-25: 64 mL via INTRAVENOUS

## 2011-12-25 NOTE — Progress Notes (Signed)
Blood sample obtained from right arm IV for Creatnine level.  

## 2012-06-18 ENCOUNTER — Other Ambulatory Visit: Payer: Self-pay | Admitting: Radiation Oncology

## 2012-12-16 ENCOUNTER — Ambulatory Visit (HOSPITAL_COMMUNITY)
Admission: RE | Admit: 2012-12-16 | Discharge: 2012-12-16 | Disposition: A | Discharge status: Home / Self Care | Payer: Medicare Other | Source: Ambulatory Visit | Attending: Radiation Oncology | Admitting: Radiation Oncology

## 2012-12-16 DIAGNOSIS — C3490 Malignant neoplasm of unspecified part of unspecified bronchus or lung: Secondary | ICD-10-CM | POA: Insufficient documentation

## 2012-12-16 DIAGNOSIS — R599 Enlarged lymph nodes, unspecified: Secondary | ICD-10-CM | POA: Insufficient documentation

## 2012-12-16 DIAGNOSIS — R918 Other nonspecific abnormal finding of lung field: Secondary | ICD-10-CM | POA: Insufficient documentation

## 2012-12-16 DIAGNOSIS — Z8611 Personal history of tuberculosis: Secondary | ICD-10-CM | POA: Insufficient documentation

## 2012-12-16 DIAGNOSIS — R05 Cough: Secondary | ICD-10-CM | POA: Insufficient documentation

## 2012-12-16 DIAGNOSIS — R059 Cough, unspecified: Secondary | ICD-10-CM | POA: Insufficient documentation

## 2012-12-16 MED ORDER — IOHEXOL 300 MG/ML  SOLN
64.0000 mL | Freq: Once | INTRAMUSCULAR | Status: AC | PRN
  Administered 2012-12-16: 64 mL via INTRAVENOUS

## 2013-07-23 ENCOUNTER — Other Ambulatory Visit (HOSPITAL_COMMUNITY): Payer: Self-pay | Admitting: Radiation Oncology

## 2013-07-23 DIAGNOSIS — C3490 Malignant neoplasm of unspecified part of unspecified bronchus or lung: Secondary | ICD-10-CM

## 2013-08-14 ENCOUNTER — Emergency Department (HOSPITAL_COMMUNITY)
Admission: EM | Admit: 2013-08-14 | Discharge: 2013-08-14 | Disposition: A | Discharge status: Home / Self Care | Payer: Medicare Other | Attending: Emergency Medicine | Admitting: Emergency Medicine

## 2013-08-14 ENCOUNTER — Encounter (HOSPITAL_COMMUNITY): Payer: Self-pay | Admitting: Emergency Medicine

## 2013-08-14 DIAGNOSIS — Z880 Allergy status to penicillin: Secondary | ICD-10-CM | POA: Insufficient documentation

## 2013-08-14 DIAGNOSIS — Z85118 Personal history of other malignant neoplasm of bronchus and lung: Secondary | ICD-10-CM | POA: Insufficient documentation

## 2013-08-14 DIAGNOSIS — G3183 Dementia with Lewy bodies: Secondary | ICD-10-CM

## 2013-08-14 DIAGNOSIS — Z79899 Other long term (current) drug therapy: Secondary | ICD-10-CM | POA: Insufficient documentation

## 2013-08-14 DIAGNOSIS — F0280 Dementia in other diseases classified elsewhere without behavioral disturbance: Secondary | ICD-10-CM | POA: Insufficient documentation

## 2013-08-14 DIAGNOSIS — Z7982 Long term (current) use of aspirin: Secondary | ICD-10-CM | POA: Insufficient documentation

## 2013-08-14 DIAGNOSIS — E785 Hyperlipidemia, unspecified: Secondary | ICD-10-CM | POA: Insufficient documentation

## 2013-08-14 DIAGNOSIS — E079 Disorder of thyroid, unspecified: Secondary | ICD-10-CM | POA: Insufficient documentation

## 2013-08-14 DIAGNOSIS — M810 Age-related osteoporosis without current pathological fracture: Secondary | ICD-10-CM | POA: Insufficient documentation

## 2013-08-14 DIAGNOSIS — Z8619 Personal history of other infectious and parasitic diseases: Secondary | ICD-10-CM | POA: Insufficient documentation

## 2013-08-14 DIAGNOSIS — Z8541 Personal history of malignant neoplasm of cervix uteri: Secondary | ICD-10-CM | POA: Insufficient documentation

## 2013-08-14 DIAGNOSIS — E119 Type 2 diabetes mellitus without complications: Secondary | ICD-10-CM | POA: Insufficient documentation

## 2013-08-14 DIAGNOSIS — G4700 Insomnia, unspecified: Secondary | ICD-10-CM | POA: Insufficient documentation

## 2013-08-14 DIAGNOSIS — Z8701 Personal history of pneumonia (recurrent): Secondary | ICD-10-CM | POA: Insufficient documentation

## 2013-08-14 DIAGNOSIS — Z87891 Personal history of nicotine dependence: Secondary | ICD-10-CM | POA: Insufficient documentation

## 2013-08-14 DIAGNOSIS — Z8679 Personal history of other diseases of the circulatory system: Secondary | ICD-10-CM | POA: Insufficient documentation

## 2013-08-14 DIAGNOSIS — I10 Essential (primary) hypertension: Secondary | ICD-10-CM | POA: Insufficient documentation

## 2013-08-14 DIAGNOSIS — K219 Gastro-esophageal reflux disease without esophagitis: Secondary | ICD-10-CM | POA: Insufficient documentation

## 2013-08-14 HISTORY — DX: Parkinson's disease without dyskinesia, without mention of fluctuations: G20.A1

## 2013-08-14 HISTORY — DX: Unspecified dementia, unspecified severity, without behavioral disturbance, psychotic disturbance, mood disturbance, and anxiety: F03.90

## 2013-08-14 HISTORY — DX: Parkinson's disease: G20

## 2013-08-14 LAB — BASIC METABOLIC PANEL
BUN: 17 mg/dL (ref 6–23)
CALCIUM: 10 mg/dL (ref 8.4–10.5)
CO2: 24 mEq/L (ref 19–32)
CREATININE: 1.36 mg/dL — AB (ref 0.50–1.10)
Chloride: 103 mEq/L (ref 96–112)
GFR, EST AFRICAN AMERICAN: 41 mL/min — AB (ref >90)
GFR, EST NON AFRICAN AMERICAN: 36 mL/min — AB (ref >90)
Glucose, Bld: 141 mg/dL — ABNORMAL HIGH (ref 70–99)
Potassium: 4.5 mEq/L (ref 3.7–5.3)
Sodium: 143 mEq/L (ref 137–147)

## 2013-08-14 NOTE — ED Notes (Signed)
Patient reports new Donepezil medication, took second dose last night. Since starting, reports hallucinations, restlessness, insomnia. Patient appears anxious, shaky.

## 2013-08-14 NOTE — ED Notes (Signed)
Pt c/o feeling restless and unable to sleep last night; pt states she feels like she is shaking from the bottom of her feet to the top of her head

## 2013-08-14 NOTE — ED Provider Notes (Addendum)
LEVEL 5: CAVEAT Dementia CSN: [id number]     Arrival date & time 08/14/13  0840 History  This chart was scribed for Orlie Dakin, MD,  by Stacy Gardner, ED Scribe. The patient was seen in room APA08/APA08 and the patient's care was started at 9:07 AM.   First MD Initiated Contact with Patient 08/14/13 0859     Chief Complaint  Patient presents with  . Shaking     (Consider location/radiation/quality/duration/timing/severity/associated sxs/prior Treatment) The history is provided by the patient, a relative and medical records.   HPI Comments:HISTORY PROVIDED BY BOTH PT AND PT'S DAUGHTER.  Krista [name] is a 80 y.o. female w/ hx of Parkinson Disease who presents to the Emergency Department complaining of inability to sleep last night. She was unable patient with supple mild last night. . Pt's daughter mentions that pt's tremors are baseline for her and unchanged from baseline   Pt was dx with Parkinson Disease December, 2014 and started taking the medication the last month; however, she has hallucinations with taking the medication. Therefore Parkinson medication was stopped Pt also has hx of dementia and she was given Donepezil 5mg . She stays at home alone and is able to do most of her ADL independently. Denies any pain. Denies any trouble breathing. Denies dysuria.  Pt does not smoke or drink. Only patient looks at baseline per daughter accept that she looks tired from being up all night   Past Medical History  Diagnosis Date  . CA - cancer     thyroid - 2008 surg  . Hyperlipidemia   . Osteoporosis   . Pneumonia 5/13  . GERD (gastroesophageal reflux disease)   . History of migraine headaches   . Vertigo   . History of tuberculosis 1965  . Thyroid disease 2008    thyroid cancer cured with surgery  . Lung cancer 09/04/2011    non-small cell ca in L upper lobe  . Hypertension   . Diabetes mellitus     controlled with medication  . Cancer     cervical ca - 2005 - surg and  radiation  . Parkinson's disease   . Dementia    Past Surgical History  Procedure Laterality Date  . Thyroidectomy  2007  . Hip arthroplasty  2010    left  . Cholecystectomy  1994  . Cataract extraction    . Lung biopsy  09/04/2011  . Abdominal hysterectomy  2005    complete   Family History  Problem Relation Age of Onset  . Lung disease    . Cancer Sister     breast  . Cancer Sister     brain  . Cancer Sister     ovarian    History  Substance Use Topics  . Smoking status: Former Smoker -- 0.25 packs/day for 20 years    Types: Cigarettes    Quit date: 03/04/1982  . Smokeless tobacco: Never Used  . Alcohol Use: No   OB History   Grav Para Term Preterm Abortions TAB SAB Ect Mult Living                 Review of Systems  Unable to perform ROS: Dementia   tremor    Allergies  Amoxicillin and Sulfonamide derivatives  Home Medications   Prior to Admission medications   Medication Sig Start Date End Date Taking? Authorizing Provider  alendronate (FOSAMAX) 70 MG tablet Take 70 mg by mouth every 7 (seven) days. Take with a full glass of  water on an empty stomach.    Historical Provider, MD  aspirin 81 MG tablet Take 81 mg by mouth daily.    Historical Provider, MD  budesonide-formoterol (SYMBICORT) 160-4.5 MCG/ACT inhaler Inhale 2 puffs into the lungs 2 (two) times daily.    Historical Provider, MD  Calcium-Magnesium-Vitamin D (CALCIUM MAGNESIUM PO) Take 1 tablet by mouth daily.    Historical Provider, MD  Levothyroxine Sodium 75 MCG CAPS Take 75 mcg by mouth daily.     Historical Provider, MD  meclizine (ANTIVERT) 25 MG tablet Take 25 mg by mouth 3 (three) times daily.     Historical Provider, MD  metFORMIN (GLUCOPHAGE) 500 MG tablet Take 500 mg by mouth 2 (two) times daily with a meal.     Historical Provider, MD  pantoprazole (PROTONIX) 40 MG tablet Take 40 mg by mouth daily.    Historical Provider, MD  rosuvastatin (CRESTOR) 20 MG tablet Take 20 mg by mouth daily.     Historical Provider, MD   BP 130/70  Temp(Src) 97.7 F (36.5 C) (Oral)  Resp 20  Ht 5\' 8"  (1.727 m)  Wt 116 lb (52.617 kg)  BMI 17.64 kg/m2  SpO2 98% Physical Exam  Nursing note and vitals reviewed. Constitutional: She is oriented to person, place, and time. She appears well-developed and well-nourished.  HENT:  Head: Normocephalic.  Eyes: Pupils are equal, round, and reactive to light.  Neck: Normal range of motion.  Cardiovascular: Normal rate.   Pulmonary/Chest: Effort normal.  Neurological: She is alert and oriented to person, place, and time. She has normal reflexes. Coordination normal.  Parkinsonian tremors Gait is normal Romberg is normal Pronator drift is normal Gait normal DTRs symmetric bilaterally at knee jerk ankle jerk and biceps toes downward going bilaterally  Skin: Skin is warm and dry.    ED Course  Procedures (including critical care time) DIAGNOSTIC STUDIES: Oxygen Saturation is 98% on room air, normal by my interpretation.    COORDINATION OF CARE:  9:10 AM Discussed course of care with pt and pt's daughter.    Labs Review Labs Reviewed - No data to display  Imaging Review No results found.   EKG Interpretation None      MDM  Tremors chronic and unchanged from baseline. Patient is here as she could not sleep last night. Sig Final diagnoses:  None   suggest Benadryl 25 mg at bedtime for sleep. Followup Dr. Merlene Laughter Diagnosis #1 insomnia   #2 Parkinsonian tremor #3 hyperglycemia  I personally performed the services described in this documentation, which was scribed in my presence. The recorded information has been reviewed and considered.      Orlie Dakin, MD 08/14/13 [place], MD 08/14/13 1045

## 2013-08-14 NOTE — Discharge Instructions (Signed)
Insomnia Ms Tilson can take Benadryl 25 mg at bedtime for sleep. Call Dr.[place] in today to schedule an office appointment for possible adjustment of her medications Insomnia is frequent trouble falling and/or staying asleep. Insomnia can be a long term problem or a short term problem. Both are common. Insomnia can be a short term problem when the wakefulness is related to a certain stress or worry. Long term insomnia is often related to ongoing stress during waking hours and/or poor sleeping habits. Overtime, sleep deprivation itself can make the problem worse. Every little thing feels more severe because you are overtired and your ability to cope is decreased. CAUSES   Stress, anxiety, and depression.  Poor sleeping habits.  Distractions such as TV in the bedroom.  Naps close to bedtime.  Engaging in emotionally charged conversations before bed.  Technical reading before sleep.  Alcohol and other sedatives. They may make the problem worse. They can hurt normal sleep patterns and normal dream activity.  Stimulants such as caffeine for several hours prior to bedtime.  Pain syndromes and shortness of breath can cause insomnia.  Exercise late at night.  Changing time zones may cause sleeping problems (jet lag). It is sometimes helpful to have someone observe your sleeping patterns. They should look for periods of not breathing during the night (sleep apnea). They should also look to see how long those periods last. If you live alone or observers are uncertain, you can also be observed at a sleep clinic where your sleep patterns will be professionally monitored. Sleep apnea requires a checkup and treatment. Give your caregivers your medical history. Give your caregivers observations your family has made about your sleep.  SYMPTOMS   Not feeling rested in the morning.  Anxiety and restlessness at bedtime.  Difficulty falling and staying asleep. TREATMENT   Your caregiver may  prescribe treatment for an underlying medical disorders. Your caregiver can give advice or help if you are using alcohol or other drugs for self-medication. Treatment of underlying problems will usually eliminate insomnia problems.  Medications can be prescribed for short time use. They are generally not recommended for lengthy use.  Over-the-counter sleep medicines are not recommended for lengthy use. They can be habit forming.  You can promote easier sleeping by making lifestyle changes such as:  Using relaxation techniques that help with breathing and reduce muscle tension.  Exercising earlier in the day.  Changing your diet and the time of your last meal. No night time snacks.  Establish a regular time to go to bed.  Counseling can help with stressful problems and worry.  Soothing music and white noise may be helpful if there are background noises you cannot remove.  Stop tedious detailed work at least one hour before bedtime. HOME CARE INSTRUCTIONS   Keep a diary. Inform your caregiver about your progress. This includes any medication side effects. See your caregiver regularly. Take note of:  Times when you are asleep.  Times when you are awake during the night.  The quality of your sleep.  How you feel the next day. This information will help your caregiver care for you.  Get out of bed if you are still awake after 15 minutes. Read or do some quiet activity. Keep the lights down. Wait until you feel sleepy and go back to bed.  Keep regular sleeping and waking hours. Avoid naps.  Exercise regularly.  Avoid distractions at bedtime. Distractions include watching television or engaging in any intense or detailed activity like  attempting to balance the household checkbook.  Develop a bedtime ritual. Keep a familiar routine of bathing, brushing your teeth, climbing into bed at the same time each night, listening to soothing music. Routines increase the success of falling to  sleep faster.  Use relaxation techniques. This can be using breathing and muscle tension release routines. It can also include visualizing peaceful scenes. You can also help control troubling or intruding thoughts by keeping your mind occupied with boring or repetitive thoughts like the old concept of counting sheep. You can make it more creative like imagining planting one beautiful flower after another in your backyard garden.  During your day, work to eliminate stress. When this is not possible use some of the previous suggestions to help reduce the anxiety that accompanies stressful situations. MAKE SURE YOU:   Understand these instructions.  Will watch your condition.  Will get help right away if you are not doing well or get worse. Document Released: 02/16/2000 Document Revised: 05/13/2011 Document Reviewed: 03/18/2007 [facility] Patient Information 2014 [place].

## 2013-08-14 NOTE — ED Notes (Signed)
Patient with no complaints at this time. Respirations even and unlabored. Skin warm/dry. Discharge instructions reviewed with patient at this time. Patient given opportunity to voice concerns/ask questions. Patient discharged at this time and left Emergency Department with steady gait.   

## 2013-08-20 ENCOUNTER — Encounter (HOSPITAL_COMMUNITY): Payer: Self-pay

## 2013-08-20 ENCOUNTER — Ambulatory Visit (HOSPITAL_COMMUNITY)
Admission: RE | Admit: 2013-08-20 | Discharge: 2013-08-20 | Disposition: A | Discharge status: Home / Self Care | Payer: Medicare Other | Source: Ambulatory Visit | Attending: Radiation Oncology | Admitting: Radiation Oncology

## 2013-08-20 DIAGNOSIS — C3490 Malignant neoplasm of unspecified part of unspecified bronchus or lung: Secondary | ICD-10-CM | POA: Diagnosis present

## 2013-08-20 DIAGNOSIS — R911 Solitary pulmonary nodule: Secondary | ICD-10-CM | POA: Diagnosis not present

## 2013-08-20 DIAGNOSIS — Z85850 Personal history of malignant neoplasm of thyroid: Secondary | ICD-10-CM | POA: Insufficient documentation

## 2013-08-20 DIAGNOSIS — Z8611 Personal history of tuberculosis: Secondary | ICD-10-CM | POA: Diagnosis not present

## 2013-08-20 DIAGNOSIS — Z8541 Personal history of malignant neoplasm of cervix uteri: Secondary | ICD-10-CM | POA: Insufficient documentation

## 2013-08-20 MED ORDER — IOHEXOL 300 MG/ML  SOLN
64.0000 mL | Freq: Once | INTRAMUSCULAR | Status: AC | PRN
  Administered 2013-08-20: 80 mL via INTRAVENOUS

## 2013-08-30 ENCOUNTER — Emergency Department (HOSPITAL_COMMUNITY)
Admission: EM | Admit: 2013-08-30 | Discharge: 2013-08-30 | Disposition: A | Discharge status: Home / Self Care | Payer: Medicare Other | Attending: Emergency Medicine | Admitting: Emergency Medicine

## 2013-08-30 ENCOUNTER — Encounter (HOSPITAL_COMMUNITY): Payer: Self-pay | Admitting: Emergency Medicine

## 2013-08-30 DIAGNOSIS — N3000 Acute cystitis without hematuria: Secondary | ICD-10-CM

## 2013-08-30 DIAGNOSIS — E785 Hyperlipidemia, unspecified: Secondary | ICD-10-CM | POA: Diagnosis not present

## 2013-08-30 DIAGNOSIS — Z880 Allergy status to penicillin: Secondary | ICD-10-CM | POA: Insufficient documentation

## 2013-08-30 DIAGNOSIS — Z85118 Personal history of other malignant neoplasm of bronchus and lung: Secondary | ICD-10-CM | POA: Insufficient documentation

## 2013-08-30 DIAGNOSIS — G20 Parkinson's disease: Secondary | ICD-10-CM | POA: Insufficient documentation

## 2013-08-30 DIAGNOSIS — Z8719 Personal history of other diseases of the digestive system: Secondary | ICD-10-CM | POA: Diagnosis not present

## 2013-08-30 DIAGNOSIS — I10 Essential (primary) hypertension: Secondary | ICD-10-CM | POA: Diagnosis not present

## 2013-08-30 DIAGNOSIS — M810 Age-related osteoporosis without current pathological fracture: Secondary | ICD-10-CM | POA: Diagnosis not present

## 2013-08-30 DIAGNOSIS — Z85850 Personal history of malignant neoplasm of thyroid: Secondary | ICD-10-CM | POA: Insufficient documentation

## 2013-08-30 DIAGNOSIS — Z8611 Personal history of tuberculosis: Secondary | ICD-10-CM | POA: Diagnosis not present

## 2013-08-30 DIAGNOSIS — Z8679 Personal history of other diseases of the circulatory system: Secondary | ICD-10-CM | POA: Insufficient documentation

## 2013-08-30 DIAGNOSIS — Z79899 Other long term (current) drug therapy: Secondary | ICD-10-CM | POA: Insufficient documentation

## 2013-08-30 DIAGNOSIS — F0390 Unspecified dementia without behavioral disturbance: Secondary | ICD-10-CM | POA: Diagnosis not present

## 2013-08-30 DIAGNOSIS — R112 Nausea with vomiting, unspecified: Secondary | ICD-10-CM

## 2013-08-30 DIAGNOSIS — G20A1 Parkinson's disease without dyskinesia, without mention of fluctuations: Secondary | ICD-10-CM | POA: Insufficient documentation

## 2013-08-30 DIAGNOSIS — Z7982 Long term (current) use of aspirin: Secondary | ICD-10-CM | POA: Insufficient documentation

## 2013-08-30 DIAGNOSIS — Z8701 Personal history of pneumonia (recurrent): Secondary | ICD-10-CM | POA: Insufficient documentation

## 2013-08-30 DIAGNOSIS — E119 Type 2 diabetes mellitus without complications: Secondary | ICD-10-CM | POA: Diagnosis not present

## 2013-08-30 DIAGNOSIS — Z87891 Personal history of nicotine dependence: Secondary | ICD-10-CM | POA: Insufficient documentation

## 2013-08-30 LAB — URINALYSIS, ROUTINE W REFLEX MICROSCOPIC
Bilirubin Urine: NEGATIVE
GLUCOSE, UA: NEGATIVE mg/dL
Hgb urine dipstick: NEGATIVE
KETONES UR: NEGATIVE mg/dL
LEUKOCYTES UA: NEGATIVE
NITRITE: POSITIVE — AB
PH: 5.5 (ref 5.0–8.0)
Specific Gravity, Urine: 1.025 (ref 1.005–1.030)
Urobilinogen, UA: 0.2 mg/dL (ref 0.0–1.0)

## 2013-08-30 LAB — CBC WITH DIFFERENTIAL/PLATELET
BASOS ABS: 0 10*3/uL (ref 0.0–0.1)
BASOS PCT: 0 % (ref 0–1)
Eosinophils Absolute: 0 10*3/uL (ref 0.0–0.7)
Eosinophils Relative: 0 % (ref 0–5)
HCT: 37.3 % (ref 36.0–46.0)
Hemoglobin: 12.3 g/dL (ref 12.0–15.0)
Lymphocytes Relative: 9 % — ABNORMAL LOW (ref 12–46)
Lymphs Abs: 1 10*3/uL (ref 0.7–4.0)
MCH: 30.1 pg (ref 26.0–34.0)
MCHC: 33 g/dL (ref 30.0–36.0)
MCV: 91.4 fL (ref 78.0–100.0)
MONO ABS: 0.2 10*3/uL (ref 0.1–1.0)
Monocytes Relative: 2 % — ABNORMAL LOW (ref 3–12)
NEUTROS PCT: 89 % — AB (ref 43–77)
Neutro Abs: 9.6 10*3/uL — ABNORMAL HIGH (ref 1.7–7.7)
Platelets: 325 10*3/uL (ref 150–400)
RBC: 4.08 MIL/uL (ref 3.87–5.11)
RDW: 14.3 % (ref 11.5–15.5)
WBC: 10.7 10*3/uL — AB (ref 4.0–10.5)

## 2013-08-30 LAB — BASIC METABOLIC PANEL
BUN: 17 mg/dL (ref 6–23)
CALCIUM: 9.5 mg/dL (ref 8.4–10.5)
CHLORIDE: 101 meq/L (ref 96–112)
CO2: 24 mEq/L (ref 19–32)
CREATININE: 1.22 mg/dL — AB (ref 0.50–1.10)
GFR calc non Af Amer: 41 mL/min — ABNORMAL LOW (ref >90)
GFR, EST AFRICAN AMERICAN: 47 mL/min — AB (ref >90)
Glucose, Bld: 167 mg/dL — ABNORMAL HIGH (ref 70–99)
Potassium: 4.8 mEq/L (ref 3.7–5.3)
Sodium: 140 mEq/L (ref 137–147)

## 2013-08-30 LAB — URINE MICROSCOPIC-ADD ON

## 2013-08-30 MED ORDER — ONDANSETRON HCL 4 MG/2ML IJ SOLN
4.0000 mg | Freq: Once | INTRAMUSCULAR | Status: AC
  Administered 2013-08-30: 4 mg via INTRAVENOUS
  Filled 2013-08-30: qty 2

## 2013-08-30 MED ORDER — SODIUM CHLORIDE 0.9 % IV BOLUS (SEPSIS)
500.0000 mL | Freq: Once | INTRAVENOUS | Status: AC
  Administered 2013-08-30: 14:00:00 via INTRAVENOUS

## 2013-08-30 MED ORDER — SODIUM CHLORIDE 0.9 % IV SOLN
Freq: Once | INTRAVENOUS | Status: AC
  Administered 2013-08-30: 14:00:00 via INTRAVENOUS

## 2013-08-30 MED ORDER — CEFTRIAXONE SODIUM 1 G IJ SOLR
1.0000 g | Freq: Once | INTRAMUSCULAR | Status: AC
  Administered 2013-08-30: 1 g via INTRAVENOUS
  Filled 2013-08-30: qty 10

## 2013-08-30 MED ORDER — NITROFURANTOIN MONOHYD MACRO 100 MG PO CAPS: 100.0000 mg | ORAL_CAPSULE | Freq: Two times a day (BID) | ORAL | Status: DC

## 2013-08-30 MED ORDER — ONDANSETRON 4 MG PO TBDP: 4.0000 mg | ORAL_TABLET | Freq: Three times a day (TID) | ORAL | Status: DC | PRN

## 2013-08-30 NOTE — ED Notes (Signed)
MD at bedside. 

## 2013-08-30 NOTE — Discharge Instructions (Signed)

## 2013-08-30 NOTE — ED Provider Notes (Signed)
CSN: [id number]     Arrival date & time 08/30/13  1145 History  This chart was scribed for Tanna Furry, MD by Elby Beck, ED Scribe. This patient was seen in room APA19/APA19 and the patient's care was started at 1:42 PM.   Chief Complaint  Patient presents with  . Emesis    The history is provided by the patient. The history is limited by the condition of the patient. No language interpreter was used.   LEVEL 5 CAVEAT (DEMENTIA)  HPI Comments: Krista [name] is a 80 y.o. female with a history of dementia who presents to the Emergency Department complaining of nausea with multiple episodes of emesis onset at 6:00 AM this morning. She estimates that she has had 6 episodes of emesis. She reports associated generalized weakness and burning in her throat. She states that she has a history of lung CA, which she has been previously treated for, and that she missed a check-up appointment today due to her symptoms.    Past Medical History  Diagnosis Date  . CA - cancer     thyroid - 2008 surg  . Hyperlipidemia   . Osteoporosis   . Pneumonia 5/13  . GERD (gastroesophageal reflux disease)   . History of migraine headaches   . Vertigo   . History of tuberculosis 1965  . Thyroid disease 2008    thyroid cancer cured with surgery  . Lung cancer 09/04/2011    non-small cell ca in L upper lobe  . Hypertension   . Diabetes mellitus     controlled with medication  . Cancer     cervical ca - 2005 - surg and radiation  . Parkinson's disease   . Dementia    Past Surgical History  Procedure Laterality Date  . Thyroidectomy  2007  . Hip arthroplasty  2010    left  . Cholecystectomy  1994  . Cataract extraction    . Lung biopsy  09/04/2011  . Abdominal hysterectomy  2005    complete   Family History  Problem Relation Age of Onset  . Lung disease    . Cancer Sister     breast  . Cancer Sister     brain  . Cancer Sister     ovarian    History  Substance Use Topics  . Smoking  status: Former Smoker -- 0.25 packs/day for 20 years    Types: Cigarettes    Quit date: 03/04/1982  . Smokeless tobacco: Never Used  . Alcohol Use: No   OB History   Grav Para Term Preterm Abortions TAB SAB Ect Mult Living                 Review of Systems  Unable to perform ROS: Dementia    Allergies  Amoxicillin; Statins; and Sulfonamide derivatives  Home Medications   Prior to Admission medications   Medication Sig Start Date End Date Taking? Authorizing Provider  aspirin 81 MG tablet Take 81 mg by mouth daily.   Yes Historical Provider, MD  budesonide-formoterol (SYMBICORT) 160-4.5 MCG/ACT inhaler Inhale 2 puffs into the lungs 2 (two) times daily.   Yes Historical Provider, MD  carbidopa-levodopa (SINEMET IR) 25-100 MG per tablet Take 0.5 tablets by mouth 3 (three) times daily.   Yes Historical Provider, MD  Cyanocobalamin (B-12) 1000 MCG TBCR Take 1 tablet by mouth daily.   Yes Historical Provider, MD  Levothyroxine Sodium 75 MCG CAPS Take 75 mcg by mouth daily.    Yes  Historical Provider, MD  meclizine (ANTIVERT) 25 MG tablet Take 25 mg by mouth 3 (three) times daily.    Yes Historical Provider, MD  metFORMIN (GLUCOPHAGE) 500 MG tablet Take 500 mg by mouth 2 (two) times daily with a meal.    Yes Historical Provider, MD  rosuvastatin (CRESTOR) 20 MG tablet Take 20 mg by mouth daily.   Yes Historical Provider, MD  tobramycin-dexamethasone [facility] Cancer) ophthalmic ointment Place 1 application into the left eye 4 (four) times daily.   Yes Historical Provider, MD  nitrofurantoin, macrocrystal-monohydrate, (MACROBID) 100 MG capsule Take 1 capsule (100 mg total) by mouth 2 (two) times daily. 08/30/13   Tanna Furry, MD  ondansetron (ZOFRAN ODT) 4 MG disintegrating tablet Take 1 tablet (4 mg total) by mouth every 8 (eight) hours as needed for nausea. 08/30/13   Tanna Furry, MD   Triage Vitals: BP 136/82  Pulse 71  Temp(Src) 98.8 F (37.1 C) (Oral)  Resp 18  SpO2 97%  Physical Exam   Nursing note and vitals reviewed. Constitutional: She is oriented to person, place, and time. She appears well-developed and well-nourished. No distress.  HENT:  Head: Normocephalic.  Tongue is dry  Eyes: Conjunctivae are normal. Pupils are equal, round, and reactive to light. No scleral icterus.  Neck: Normal range of motion. Neck supple. No thyromegaly present.  Cardiovascular: Normal rate and regular rhythm.  Exam reveals no gallop and no friction rub.   No murmur heard. Pulmonary/Chest: Effort normal and breath sounds normal. No respiratory distress. She has no wheezes. She has no rales.  Abdominal: Soft. Bowel sounds are normal. She exhibits no distension. There is no tenderness. There is no rebound.  Musculoskeletal: Normal range of motion.  Neurological: She is alert and oriented to person, place, and time.  Skin: Skin is warm and dry. No rash noted.  Psychiatric: She has a normal mood and affect. Her behavior is normal.    ED Course  Procedures (including critical care time)  DIAGNOSTIC STUDIES: Oxygen Saturation is 97% on RA, normal by my interpretation.    COORDINATION OF CARE: 1:46 PM- Will order diagnostic lab work. Will also order IV fluids and Zofran. Pt advised of plan for treatment and pt agrees.  Labs Review Labs Reviewed  CBC WITH DIFFERENTIAL - Abnormal; Notable for the following:    WBC 10.7 (*)    Neutrophils Relative % 89 (*)    Neutro Abs 9.6 (*)    Lymphocytes Relative 9 (*)    Monocytes Relative 2 (*)    All other components within normal limits  BASIC METABOLIC PANEL - Abnormal; Notable for the following:    Glucose, Bld 167 (*)    Creatinine, Ser 1.22 (*)    GFR calc non Af Amer 41 (*)    GFR calc Af Amer 47 (*)    All other components within normal limits  URINALYSIS, ROUTINE W REFLEX MICROSCOPIC - Abnormal; Notable for the following:    Protein, ur TRACE (*)    Nitrite POSITIVE (*)    All other components within normal limits  URINE  MICROSCOPIC-ADD ON - Abnormal; Notable for the following:    Bacteria, UA MANY (*)    All other components within normal limits    Imaging Review No results found.   EKG Interpretation None      MDM   Final diagnoses:  Acute cystitis without hematuria  Non-intractable vomiting with nausea, vomiting of unspecified type    Patient reevaluated x3. Continuing to feel well.  Tolerated by mouth. Given IV Rocephin. Plan Macrobid, Zofran, home. Patient, and her 2 daughters who accompany her, are comfortable with this disposition.  I personally performed the services described in this documentation, which was scribed in my presence. The recorded information has been reviewed and is accurate.   Tanna Furry, MD 08/30/13 1759

## 2013-08-30 NOTE — ED Notes (Signed)
Pt began having nausea and vomiting which began at 0600. Pt had follow up appt with doctor for lung cancer today but was too weak to go.

## 2013-09-04 ENCOUNTER — Encounter (HOSPITAL_COMMUNITY): Payer: Self-pay | Admitting: Emergency Medicine

## 2013-09-04 ENCOUNTER — Emergency Department (HOSPITAL_COMMUNITY)
Admission: EM | Admit: 2013-09-04 | Discharge: 2013-09-04 | Disposition: A | Discharge status: Home / Self Care | Payer: Medicare Other | Attending: Emergency Medicine | Admitting: Emergency Medicine

## 2013-09-04 ENCOUNTER — Emergency Department (HOSPITAL_COMMUNITY): Payer: Medicare Other

## 2013-09-04 DIAGNOSIS — Z8611 Personal history of tuberculosis: Secondary | ICD-10-CM | POA: Diagnosis not present

## 2013-09-04 DIAGNOSIS — Z880 Allergy status to penicillin: Secondary | ICD-10-CM | POA: Insufficient documentation

## 2013-09-04 DIAGNOSIS — Z7982 Long term (current) use of aspirin: Secondary | ICD-10-CM | POA: Diagnosis not present

## 2013-09-04 DIAGNOSIS — Z85118 Personal history of other malignant neoplasm of bronchus and lung: Secondary | ICD-10-CM | POA: Insufficient documentation

## 2013-09-04 DIAGNOSIS — R112 Nausea with vomiting, unspecified: Secondary | ICD-10-CM | POA: Insufficient documentation

## 2013-09-04 DIAGNOSIS — I10 Essential (primary) hypertension: Secondary | ICD-10-CM | POA: Insufficient documentation

## 2013-09-04 DIAGNOSIS — Z85850 Personal history of malignant neoplasm of thyroid: Secondary | ICD-10-CM | POA: Diagnosis not present

## 2013-09-04 DIAGNOSIS — Z792 Long term (current) use of antibiotics: Secondary | ICD-10-CM | POA: Diagnosis not present

## 2013-09-04 DIAGNOSIS — K219 Gastro-esophageal reflux disease without esophagitis: Secondary | ICD-10-CM | POA: Diagnosis not present

## 2013-09-04 DIAGNOSIS — F0390 Unspecified dementia without behavioral disturbance: Secondary | ICD-10-CM | POA: Diagnosis not present

## 2013-09-04 DIAGNOSIS — R05 Cough: Secondary | ICD-10-CM | POA: Diagnosis not present

## 2013-09-04 DIAGNOSIS — Z79899 Other long term (current) drug therapy: Secondary | ICD-10-CM | POA: Insufficient documentation

## 2013-09-04 DIAGNOSIS — E079 Disorder of thyroid, unspecified: Secondary | ICD-10-CM | POA: Insufficient documentation

## 2013-09-04 DIAGNOSIS — Z8701 Personal history of pneumonia (recurrent): Secondary | ICD-10-CM | POA: Insufficient documentation

## 2013-09-04 DIAGNOSIS — E785 Hyperlipidemia, unspecified: Secondary | ICD-10-CM | POA: Insufficient documentation

## 2013-09-04 DIAGNOSIS — R059 Cough, unspecified: Secondary | ICD-10-CM | POA: Insufficient documentation

## 2013-09-04 DIAGNOSIS — Z87891 Personal history of nicotine dependence: Secondary | ICD-10-CM | POA: Insufficient documentation

## 2013-09-04 DIAGNOSIS — Z8541 Personal history of malignant neoplasm of cervix uteri: Secondary | ICD-10-CM | POA: Insufficient documentation

## 2013-09-04 DIAGNOSIS — E119 Type 2 diabetes mellitus without complications: Secondary | ICD-10-CM | POA: Insufficient documentation

## 2013-09-04 LAB — CBC WITH DIFFERENTIAL/PLATELET
BASOS ABS: 0 10*3/uL (ref 0.0–0.1)
BASOS PCT: 0 % (ref 0–1)
EOS PCT: 0 % (ref 0–5)
Eosinophils Absolute: 0 10*3/uL (ref 0.0–0.7)
HCT: 38.9 % (ref 36.0–46.0)
Hemoglobin: 12.9 g/dL (ref 12.0–15.0)
Lymphocytes Relative: 9 % — ABNORMAL LOW (ref 12–46)
Lymphs Abs: 1 10*3/uL (ref 0.7–4.0)
MCH: 30.4 pg (ref 26.0–34.0)
MCHC: 33.2 g/dL (ref 30.0–36.0)
MCV: 91.5 fL (ref 78.0–100.0)
Monocytes Absolute: 0.3 10*3/uL (ref 0.1–1.0)
Monocytes Relative: 3 % (ref 3–12)
Neutro Abs: 9.8 10*3/uL — ABNORMAL HIGH (ref 1.7–7.7)
Neutrophils Relative %: 88 % — ABNORMAL HIGH (ref 43–77)
PLATELETS: 298 10*3/uL (ref 150–400)
RBC: 4.25 MIL/uL (ref 3.87–5.11)
RDW: 14.1 % (ref 11.5–15.5)
WBC: 11.2 10*3/uL — ABNORMAL HIGH (ref 4.0–10.5)

## 2013-09-04 LAB — URINALYSIS, ROUTINE W REFLEX MICROSCOPIC
BILIRUBIN URINE: NEGATIVE
Glucose, UA: NEGATIVE mg/dL
Hgb urine dipstick: NEGATIVE
Leukocytes, UA: NEGATIVE
NITRITE: NEGATIVE
PH: 7 (ref 5.0–8.0)
PROTEIN: 30 mg/dL — AB
Specific Gravity, Urine: 1.02 (ref 1.005–1.030)
UROBILINOGEN UA: 0.2 mg/dL (ref 0.0–1.0)

## 2013-09-04 LAB — BASIC METABOLIC PANEL
Anion gap: 16 — ABNORMAL HIGH (ref 5–15)
BUN: 22 mg/dL (ref 6–23)
CALCIUM: 10.1 mg/dL (ref 8.4–10.5)
CO2: 25 mEq/L (ref 19–32)
Chloride: 97 mEq/L (ref 96–112)
Creatinine, Ser: 1.42 mg/dL — ABNORMAL HIGH (ref 0.50–1.10)
GFR calc Af Amer: 39 mL/min — ABNORMAL LOW (ref >90)
GFR, EST NON AFRICAN AMERICAN: 34 mL/min — AB (ref >90)
Glucose, Bld: 196 mg/dL — ABNORMAL HIGH (ref 70–99)
Potassium: 4.6 mEq/L (ref 3.7–5.3)
Sodium: 138 mEq/L (ref 137–147)

## 2013-09-04 LAB — URINE MICROSCOPIC-ADD ON

## 2013-09-04 MED ORDER — ONDANSETRON HCL 4 MG/2ML IJ SOLN
4.0000 mg | Freq: Once | INTRAMUSCULAR | Status: AC
  Administered 2013-09-04: 4 mg via INTRAVENOUS
  Filled 2013-09-04: qty 2

## 2013-09-04 MED ORDER — SODIUM CHLORIDE 0.9 % IV SOLN
INTRAVENOUS | Status: DC
  Administered 2013-09-04: 20:00:00 via INTRAVENOUS

## 2013-09-04 MED ORDER — SODIUM CHLORIDE 0.9 % IV BOLUS (SEPSIS)
250.0000 mL | Freq: Once | INTRAVENOUS | Status: AC
  Administered 2013-09-04: 250 mL via INTRAVENOUS

## 2013-09-04 MED ORDER — ONDANSETRON 4 MG PO TBDP: 4.0000 mg | ORAL_TABLET | Freq: Three times a day (TID) | ORAL | Status: DC | PRN

## 2013-09-04 NOTE — ED Notes (Signed)
MD at bedside. 

## 2013-09-04 NOTE — ED Notes (Signed)
Nausea and vomiting started suddenly around 1500 today.  Was seen recently for UTI and has been on antibx.  Denies any abominal pain at present.

## 2013-09-04 NOTE — ED Notes (Signed)
Family at bedside. Assisted patient to restroom. Not able to void. States that she gets nauseated when she gets up.

## 2013-09-04 NOTE — Discharge Instructions (Signed)
Take the Zofran as directed and as needed for nausea and vomiting. Gave he a dose of Zofran right here prior to discharge. Return for any new or worse symptoms. Return for abdominal pain persistent vomiting through tomorrow. Would recommend clear liquids and advance to bland diet. Workup here today without any significant findings. Vomiting seems to be improved.

## 2013-09-04 NOTE — ED Provider Notes (Addendum)
CSN: [id number]     Arrival date & time 09/04/13  1826 History   First MD Initiated Contact with Patient 09/04/13 1849   This chart was scribed for Fredia Sorrow, MD by Rosary Lively, ED scribe. This patient was seen in room APA11/APA11 and the patient's care was started at 7:59PM.    Chief Complaint  Patient presents with  . Emesis   The history is provided by the patient and a relative. No language interpreter was used.   HPI Comments:  Krista [name] is a 80 y.o. female who presents to the Emergency Department complaining of six episodes of vomiting, with associated symptoms of nausea, onset 3:00PM.    Past Medical History  Diagnosis Date  . CA - cancer     thyroid - 2008 surg  . Hyperlipidemia   . Osteoporosis   . Pneumonia 5/13  . GERD (gastroesophageal reflux disease)   . History of migraine headaches   . Vertigo   . History of tuberculosis 1965  . Thyroid disease 2008    thyroid cancer cured with surgery  . Lung cancer 09/04/2011    non-small cell ca in L upper lobe  . Hypertension   . Diabetes mellitus     controlled with medication  . Cancer     cervical ca - 2005 - surg and radiation  . Parkinson's disease   . Dementia    Past Surgical History  Procedure Laterality Date  . Thyroidectomy  2007  . Hip arthroplasty  2010    left  . Cholecystectomy  1994  . Cataract extraction    . Lung biopsy  09/04/2011  . Abdominal hysterectomy  2005    complete   Family History  Problem Relation Age of Onset  . Lung disease    . Cancer Sister     breast  . Cancer Sister     brain  . Cancer Sister     ovarian    History  Substance Use Topics  . Smoking status: Former Smoker -- 0.25 packs/day for 20 years    Types: Cigarettes    Quit date: 03/04/1982  . Smokeless tobacco: Never Used  . Alcohol Use: No   OB History   Grav Para Term Preterm Abortions TAB SAB Ect Mult Living                 Review of Systems  Constitutional: Negative for fever and chills.   HENT: Negative for rhinorrhea and sore throat.   Eyes: Negative for visual disturbance.  Respiratory: Positive for cough. Negative for shortness of breath.   Cardiovascular: Negative for chest pain.  Gastrointestinal: Positive for nausea and vomiting. Negative for abdominal pain and diarrhea.  Genitourinary: Negative for dysuria and hematuria.  Musculoskeletal: Negative for back pain, joint swelling and neck pain.  Skin: Negative for rash.  Neurological: Negative for headaches.  Hematological: Does not bruise/bleed easily.  Psychiatric/Behavioral: Negative for confusion.      Allergies  Amoxicillin; Statins; and Sulfonamide derivatives  Home Medications   Prior to Admission medications   Medication Sig Start Date End Date Taking? Authorizing Provider  aspirin EC 81 MG tablet Take 81 mg by mouth daily.   Yes Historical Provider, MD  budesonide-formoterol (SYMBICORT) 160-4.5 MCG/ACT inhaler Inhale 2 puffs into the lungs 2 (two) times daily.   Yes Historical Provider, MD  carbidopa-levodopa (SINEMET IR) 25-100 MG per tablet Take 0.5 tablets by mouth 3 (three) times daily.   Yes Historical Provider, MD  Cyanocobalamin (  B-12) 1000 MCG TBCR Take 1 tablet by mouth daily.   Yes Historical Provider, MD  Levothyroxine Sodium 75 MCG CAPS Take 75 mcg by mouth daily.    Yes Historical Provider, MD  meclizine (ANTIVERT) 25 MG tablet Take 25 mg by mouth 3 (three) times daily.    Yes Historical Provider, MD  metFORMIN (GLUCOPHAGE) 500 MG tablet Take 500 mg by mouth 2 (two) times daily with a meal.    Yes Historical Provider, MD  nitrofurantoin, macrocrystal-monohydrate, (MACROBID) 100 MG capsule Take 1 capsule (100 mg total) by mouth 2 (two) times daily. 08/30/13  Yes Tanna Furry, MD  rosuvastatin (CRESTOR) 20 MG tablet Take 20 mg by mouth every evening.    Yes Historical Provider, MD  tobramycin-dexamethasone [facility]) ophthalmic ointment Place 1 application into the left eye 4 (four) times daily.    Yes Historical Provider, MD  ondansetron (ZOFRAN ODT) 4 MG disintegrating tablet Take 1 tablet (4 mg total) by mouth every 8 (eight) hours as needed for nausea. 08/30/13   Tanna Furry, MD  ondansetron (ZOFRAN ODT) 4 MG disintegrating tablet Take 1 tablet (4 mg total) by mouth every 8 (eight) hours as needed. 09/04/13   Fredia Sorrow, MD   BP 172/111  Pulse 82  Temp(Src) 98.1 F (36.7 C) (Oral)  Resp 16  Ht 5\' 2"  (1.575 m)  Wt 116 lb (52.617 kg)  BMI 21.21 kg/m2  SpO2 99% Physical Exam  Nursing note and vitals reviewed. Constitutional: She is oriented to person, place, and time. She appears well-developed and well-nourished.  HENT:  Head: Normocephalic and atraumatic.  Mouth/Throat: Mucous membranes are not dry.  Eyes: Conjunctivae and EOM are normal. Pupils are equal, round, and reactive to light.  Neck: Normal range of motion. Neck supple.  Cardiovascular: Normal rate, regular rhythm and normal heart sounds.   Pulmonary/Chest: Effort normal and breath sounds normal.  Abdominal: Soft. Bowel sounds are normal. There is no tenderness.  Musculoskeletal: Normal range of motion. She exhibits no edema.  Neurological: She is alert and oriented to person, place, and time. No cranial nerve deficit. She exhibits normal muscle tone. Coordination normal.  Skin: Skin is warm and dry. No rash noted.  Psychiatric: She has a normal mood and affect. Her behavior is normal.    ED Course  Procedures  DIAGNOSTIC STUDIES: Oxygen Saturation is 97% on RA, adequate by my interpretation.  COORDINATION OF CARE: 8:05 PM-Discussed treatment plan with pt at bedside and pt agreed to plan.  Results for orders placed during the hospital encounter of 74/25/95  BASIC METABOLIC PANEL      Result Value Ref Range   Sodium 138  137 - 147 mEq/L   Potassium 4.6  3.7 - 5.3 mEq/L   Chloride 97  96 - 112 mEq/L   CO2 25  19 - 32 mEq/L   Glucose, Bld 196 (*) 70 - 99 mg/dL   BUN 22  6 - 23 mg/dL   Creatinine, Ser  1.42 (*) 0.50 - 1.10 mg/dL   Calcium 10.1  8.4 - 10.5 mg/dL   GFR calc non Af Amer 34 (*) >90 mL/min   GFR calc Af Amer 39 (*) >90 mL/min   Anion gap 16 (*) 5 - 15  CBC WITH DIFFERENTIAL      Result Value Ref Range   WBC 11.2 (*) 4.0 - 10.5 K/uL   RBC 4.25  3.87 - 5.11 MIL/uL   Hemoglobin 12.9  12.0 - 15.0 g/dL   HCT 38.9  36.0 - 46.0 %   MCV 91.5  78.0 - 100.0 fL   MCH 30.4  26.0 - 34.0 pg   MCHC 33.2  30.0 - 36.0 g/dL   RDW 14.1  11.5 - 15.5 %   Platelets 298  150 - 400 K/uL   Neutrophils Relative % 88 (*) 43 - 77 %   Neutro Abs 9.8 (*) 1.7 - 7.7 K/uL   Lymphocytes Relative 9 (*) 12 - 46 %   Lymphs Abs 1.0  0.7 - 4.0 K/uL   Monocytes Relative 3  3 - 12 %   Monocytes Absolute 0.3  0.1 - 1.0 K/uL   Eosinophils Relative 0  0 - 5 %   Eosinophils Absolute 0.0  0.0 - 0.7 K/uL   Basophils Relative 0  0 - 1 %   Basophils Absolute 0.0  0.0 - 0.1 K/uL  URINALYSIS, ROUTINE W REFLEX MICROSCOPIC      Result Value Ref Range   Color, Urine YELLOW  YELLOW   APPearance CLEAR  CLEAR   Specific Gravity, Urine 1.020  1.005 - 1.030   pH 7.0  5.0 - 8.0   Glucose, UA NEGATIVE  NEGATIVE mg/dL   Hgb urine dipstick NEGATIVE  NEGATIVE   Bilirubin Urine NEGATIVE  NEGATIVE   Ketones, ur TRACE (*) NEGATIVE mg/dL   Protein, ur 30 (*) NEGATIVE mg/dL   Urobilinogen, UA 0.2  0.0 - 1.0 mg/dL   Nitrite NEGATIVE  NEGATIVE   Leukocytes, UA NEGATIVE  NEGATIVE  URINE MICROSCOPIC-ADD ON      Result Value Ref Range   WBC, UA 0-2  <3 WBC/hpf     Labs Review Labs Reviewed  BASIC METABOLIC PANEL - Abnormal; Notable for the following:    Glucose, Bld 196 (*)    Creatinine, Ser 1.42 (*)    GFR calc non Af Amer 34 (*)    GFR calc Af Amer 39 (*)    Anion gap 16 (*)    All other components within normal limits  CBC WITH DIFFERENTIAL - Abnormal; Notable for the following:    WBC 11.2 (*)    Neutrophils Relative % 88 (*)    Neutro Abs 9.8 (*)    Lymphocytes Relative 9 (*)    All other components within  normal limits  URINALYSIS, ROUTINE W REFLEX MICROSCOPIC - Abnormal; Notable for the following:    Ketones, ur TRACE (*)    Protein, ur 30 (*)    All other components within normal limits  URINE MICROSCOPIC-ADD ON   Results for orders placed during the hospital encounter of 73/22/02  BASIC METABOLIC PANEL      Result Value Ref Range   Sodium 138  137 - 147 mEq/L   Potassium 4.6  3.7 - 5.3 mEq/L   Chloride 97  96 - 112 mEq/L   CO2 25  19 - 32 mEq/L   Glucose, Bld 196 (*) 70 - 99 mg/dL   BUN 22  6 - 23 mg/dL   Creatinine, Ser 1.42 (*) 0.50 - 1.10 mg/dL   Calcium 10.1  8.4 - 10.5 mg/dL   GFR calc non Af Amer 34 (*) >90 mL/min   GFR calc Af Amer 39 (*) >90 mL/min   Anion gap 16 (*) 5 - 15  CBC WITH DIFFERENTIAL      Result Value Ref Range   WBC 11.2 (*) 4.0 - 10.5 K/uL   RBC 4.25  3.87 - 5.11 MIL/uL   Hemoglobin 12.9  12.0 -  15.0 g/dL   HCT 38.9  36.0 - 46.0 %   MCV 91.5  78.0 - 100.0 fL   MCH 30.4  26.0 - 34.0 pg   MCHC 33.2  30.0 - 36.0 g/dL   RDW 14.1  11.5 - 15.5 %   Platelets 298  150 - 400 K/uL   Neutrophils Relative % 88 (*) 43 - 77 %   Neutro Abs 9.8 (*) 1.7 - 7.7 K/uL   Lymphocytes Relative 9 (*) 12 - 46 %   Lymphs Abs 1.0  0.7 - 4.0 K/uL   Monocytes Relative 3  3 - 12 %   Monocytes Absolute 0.3  0.1 - 1.0 K/uL   Eosinophils Relative 0  0 - 5 %   Eosinophils Absolute 0.0  0.0 - 0.7 K/uL   Basophils Relative 0  0 - 1 %   Basophils Absolute 0.0  0.0 - 0.1 K/uL  URINALYSIS, ROUTINE W REFLEX MICROSCOPIC      Result Value Ref Range   Color, Urine YELLOW  YELLOW   APPearance CLEAR  CLEAR   Specific Gravity, Urine 1.020  1.005 - 1.030   pH 7.0  5.0 - 8.0   Glucose, UA NEGATIVE  NEGATIVE mg/dL   Hgb urine dipstick NEGATIVE  NEGATIVE   Bilirubin Urine NEGATIVE  NEGATIVE   Ketones, ur TRACE (*) NEGATIVE mg/dL   Protein, ur 30 (*) NEGATIVE mg/dL   Urobilinogen, UA 0.2  0.0 - 1.0 mg/dL   Nitrite NEGATIVE  NEGATIVE   Leukocytes, UA NEGATIVE  NEGATIVE  URINE  MICROSCOPIC-ADD ON      Result Value Ref Range   WBC, UA 0-2  <3 WBC/hpf      Dg Abd Acute W/chest  09/04/2013   CLINICAL DATA:  Weakness, abdominal pain.  EXAM: ACUTE ABDOMEN SERIES (ABDOMEN 2 VIEW & CHEST 1 VIEW)  COMPARISON:  CT 08/20/2013  FINDINGS: Airspace disease noted in the left lung base/left lower lobe as seen on prior CT, presumably postradiation changes. Right lung is clear. Heart is borderline in size.  Nonobstructive bowel gas pattern. No free air organomegaly. Prior cholecystectomy. Extensive surgical clips along the pelvic sidewalls. Moderate stool burden throughout the colon.  No acute bony abnormality.  Prior left hip replacement.  IMPRESSION: Borderline heart size. Left basilar opacity is stable since prior CT, likely postradiation changes.  Moderate stool burden in the colon. No evidence of bowel obstruction or free air.   Electronically Signed   By: Rolm Baptise M.D.   On: 09/04/2013 20:59     EKG Interpretation None      MDM   Final diagnoses:  Nausea and vomiting, vomiting of unspecified type    Patient with onset of vomiting at the 3 afternoon. The urine emergency part with Zofran all vomiting has resolved. Abdomen is soft nontender. Patient feeling much better. No complaint of abdominal pain. Workup without significant findings on acute abdominal series. Labs without significant abnormalities. Patient we discharged home with Zofran. Precautions provided if vomiting persist she'll need to return. No evidence of acute surgical abdomen. Hypertensive here but no fever no tachycardia.   I personally performed the services described in this documentation, which was scribed in my presence. The recorded information has been reviewed and is accurate.     Fredia Sorrow, MD 09/04/13 [id number]  Fredia Sorrow, MD 09/04/13 [phone]

## 2013-09-06 ENCOUNTER — Other Ambulatory Visit: Payer: Self-pay | Admitting: Radiation Oncology

## 2013-09-06 DIAGNOSIS — C3490 Malignant neoplasm of unspecified part of unspecified bronchus or lung: Secondary | ICD-10-CM

## 2013-11-29 ENCOUNTER — Ambulatory Visit (HOSPITAL_COMMUNITY)
Admission: RE | Admit: 2013-11-29 | Discharge: 2013-11-29 | Disposition: A | Discharge status: Home / Self Care | Payer: Medicare Other | Source: Ambulatory Visit | Attending: Radiation Oncology | Admitting: Radiation Oncology

## 2013-11-29 ENCOUNTER — Encounter (HOSPITAL_COMMUNITY): Payer: Self-pay

## 2013-11-29 DIAGNOSIS — R0602 Shortness of breath: Secondary | ICD-10-CM | POA: Insufficient documentation

## 2013-11-29 DIAGNOSIS — R911 Solitary pulmonary nodule: Secondary | ICD-10-CM | POA: Insufficient documentation

## 2013-11-29 DIAGNOSIS — Z85118 Personal history of other malignant neoplasm of bronchus and lung: Secondary | ICD-10-CM | POA: Diagnosis not present

## 2013-11-29 DIAGNOSIS — I319 Disease of pericardium, unspecified: Secondary | ICD-10-CM | POA: Insufficient documentation

## 2013-11-29 DIAGNOSIS — J9819 Other pulmonary collapse: Secondary | ICD-10-CM | POA: Insufficient documentation

## 2013-11-29 DIAGNOSIS — C3490 Malignant neoplasm of unspecified part of unspecified bronchus or lung: Secondary | ICD-10-CM

## 2013-11-29 LAB — POCT I-STAT CREATININE: Creatinine, Ser: 1.5 mg/dL — ABNORMAL HIGH (ref 0.50–1.10)

## 2013-11-29 MED ORDER — IOHEXOL 300 MG/ML  SOLN
80.0000 mL | Freq: Once | INTRAMUSCULAR | Status: AC | PRN
  Administered 2013-11-29: 64 mL via INTRAVENOUS

## 2013-12-13 ENCOUNTER — Other Ambulatory Visit: Payer: Self-pay | Admitting: Radiation Oncology

## 2013-12-13 DIAGNOSIS — C3490 Malignant neoplasm of unspecified part of unspecified bronchus or lung: Secondary | ICD-10-CM

## 2014-02-28 ENCOUNTER — Other Ambulatory Visit (HOSPITAL_COMMUNITY): Payer: Self-pay | Admitting: Pulmonary Disease

## 2014-02-28 ENCOUNTER — Ambulatory Visit (HOSPITAL_COMMUNITY)
Admission: RE | Admit: 2014-02-28 | Discharge: 2014-02-28 | Disposition: A | Discharge status: Home / Self Care | Payer: Medicare Other | Source: Ambulatory Visit | Attending: Pulmonary Disease | Admitting: Pulmonary Disease

## 2014-02-28 DIAGNOSIS — Z85118 Personal history of other malignant neoplasm of bronchus and lung: Secondary | ICD-10-CM | POA: Diagnosis present

## 2014-02-28 DIAGNOSIS — R059 Cough, unspecified: Secondary | ICD-10-CM

## 2014-02-28 DIAGNOSIS — R05 Cough: Secondary | ICD-10-CM | POA: Insufficient documentation

## 2014-02-28 DIAGNOSIS — R0989 Other specified symptoms and signs involving the circulatory and respiratory systems: Secondary | ICD-10-CM

## 2014-02-28 DIAGNOSIS — J984 Other disorders of lung: Secondary | ICD-10-CM | POA: Diagnosis not present

## 2014-04-01 ENCOUNTER — Other Ambulatory Visit (HOSPITAL_COMMUNITY): Payer: Self-pay | Admitting: "Endocrinology

## 2014-04-01 DIAGNOSIS — C73 Malignant neoplasm of thyroid gland: Secondary | ICD-10-CM

## 2014-05-31 ENCOUNTER — Other Ambulatory Visit (HOSPITAL_COMMUNITY): Payer: Self-pay | Admitting: Pulmonary Disease

## 2014-05-31 DIAGNOSIS — R519 Headache, unspecified: Secondary | ICD-10-CM

## 2014-05-31 DIAGNOSIS — R51 Headache: Principal | ICD-10-CM

## 2014-06-02 ENCOUNTER — Ambulatory Visit (HOSPITAL_COMMUNITY)
Admission: RE | Admit: 2014-06-02 | Discharge: 2014-06-02 | Disposition: A | Discharge status: Home / Self Care | Payer: Medicare Other | Source: Ambulatory Visit | Attending: Pulmonary Disease | Admitting: Pulmonary Disease

## 2014-06-02 ENCOUNTER — Other Ambulatory Visit (HOSPITAL_COMMUNITY): Payer: Self-pay | Admitting: Pulmonary Disease

## 2014-06-02 DIAGNOSIS — R109 Unspecified abdominal pain: Secondary | ICD-10-CM

## 2014-06-02 DIAGNOSIS — R51 Headache: Secondary | ICD-10-CM | POA: Diagnosis not present

## 2014-06-02 DIAGNOSIS — R519 Headache, unspecified: Secondary | ICD-10-CM

## 2014-06-02 DIAGNOSIS — G20 Parkinson's disease: Secondary | ICD-10-CM | POA: Diagnosis not present

## 2014-06-08 ENCOUNTER — Ambulatory Visit (HOSPITAL_COMMUNITY)
Admission: RE | Admit: 2014-06-08 | Discharge: 2014-06-08 | Disposition: A | Discharge status: Home / Self Care | Payer: Commercial Managed Care - HMO | Source: Ambulatory Visit | Attending: Radiation Oncology | Admitting: Radiation Oncology

## 2014-06-08 ENCOUNTER — Other Ambulatory Visit: Payer: Self-pay | Admitting: Radiation Oncology

## 2014-06-08 ENCOUNTER — Inpatient Hospital Stay (HOSPITAL_COMMUNITY): Admission: RE | Admit: 2014-06-08 | Payer: Self-pay | Source: Ambulatory Visit | Admitting: Radiation Oncology

## 2014-06-08 ENCOUNTER — Encounter (HOSPITAL_COMMUNITY): Payer: Self-pay

## 2014-06-08 DIAGNOSIS — R05 Cough: Secondary | ICD-10-CM | POA: Insufficient documentation

## 2014-06-08 DIAGNOSIS — C3490 Malignant neoplasm of unspecified part of unspecified bronchus or lung: Secondary | ICD-10-CM

## 2014-06-08 DIAGNOSIS — Z87891 Personal history of nicotine dependence: Secondary | ICD-10-CM | POA: Diagnosis not present

## 2014-06-08 LAB — POCT I-STAT CREATININE: Creatinine, Ser: 1.7 mg/dL — ABNORMAL HIGH (ref 0.50–1.10)

## 2014-06-08 MED ORDER — IOHEXOL 300 MG/ML  SOLN: 80.0000 mL | Freq: Once | INTRAMUSCULAR | Status: AC | PRN

## 2014-06-08 NOTE — Progress Notes (Signed)
Creat 1.7 today; notified Santiago Glad Dr Clabe Seal nurse

## 2014-06-23 ENCOUNTER — Emergency Department (HOSPITAL_COMMUNITY): Payer: Commercial Managed Care - HMO

## 2014-06-23 ENCOUNTER — Emergency Department (HOSPITAL_COMMUNITY)
Admission: EM | Admit: 2014-06-23 | Discharge: 2014-06-23 | Disposition: A | Discharge status: Home / Self Care | Payer: Commercial Managed Care - HMO | Attending: Emergency Medicine | Admitting: Emergency Medicine

## 2014-06-23 ENCOUNTER — Encounter (HOSPITAL_COMMUNITY): Payer: Self-pay | Admitting: Cardiology

## 2014-06-23 DIAGNOSIS — Z87891 Personal history of nicotine dependence: Secondary | ICD-10-CM | POA: Insufficient documentation

## 2014-06-23 DIAGNOSIS — Z8611 Personal history of tuberculosis: Secondary | ICD-10-CM | POA: Diagnosis not present

## 2014-06-23 DIAGNOSIS — Z8701 Personal history of pneumonia (recurrent): Secondary | ICD-10-CM | POA: Diagnosis not present

## 2014-06-23 DIAGNOSIS — N390 Urinary tract infection, site not specified: Secondary | ICD-10-CM | POA: Diagnosis not present

## 2014-06-23 DIAGNOSIS — E119 Type 2 diabetes mellitus without complications: Secondary | ICD-10-CM | POA: Diagnosis not present

## 2014-06-23 DIAGNOSIS — F0390 Unspecified dementia without behavioral disturbance: Secondary | ICD-10-CM | POA: Insufficient documentation

## 2014-06-23 DIAGNOSIS — G20 Parkinson's disease: Secondary | ICD-10-CM | POA: Diagnosis not present

## 2014-06-23 DIAGNOSIS — E785 Hyperlipidemia, unspecified: Secondary | ICD-10-CM | POA: Insufficient documentation

## 2014-06-23 DIAGNOSIS — K219 Gastro-esophageal reflux disease without esophagitis: Secondary | ICD-10-CM | POA: Diagnosis not present

## 2014-06-23 DIAGNOSIS — I10 Essential (primary) hypertension: Secondary | ICD-10-CM | POA: Insufficient documentation

## 2014-06-23 DIAGNOSIS — Z880 Allergy status to penicillin: Secondary | ICD-10-CM | POA: Insufficient documentation

## 2014-06-23 DIAGNOSIS — Z8739 Personal history of other diseases of the musculoskeletal system and connective tissue: Secondary | ICD-10-CM | POA: Insufficient documentation

## 2014-06-23 DIAGNOSIS — Z8541 Personal history of malignant neoplasm of cervix uteri: Secondary | ICD-10-CM | POA: Diagnosis not present

## 2014-06-23 DIAGNOSIS — R110 Nausea: Secondary | ICD-10-CM | POA: Diagnosis not present

## 2014-06-23 DIAGNOSIS — Z85118 Personal history of other malignant neoplasm of bronchus and lung: Secondary | ICD-10-CM | POA: Diagnosis not present

## 2014-06-23 DIAGNOSIS — Z85850 Personal history of malignant neoplasm of thyroid: Secondary | ICD-10-CM | POA: Diagnosis not present

## 2014-06-23 DIAGNOSIS — Z79899 Other long term (current) drug therapy: Secondary | ICD-10-CM | POA: Insufficient documentation

## 2014-06-23 LAB — BASIC METABOLIC PANEL
ANION GAP: 14 (ref 5–15)
BUN: 27 mg/dL — AB (ref 6–23)
CO2: 21 mmol/L (ref 19–32)
Calcium: 9.4 mg/dL (ref 8.4–10.5)
Chloride: 104 mmol/L (ref 96–112)
Creatinine, Ser: 1.99 mg/dL — ABNORMAL HIGH (ref 0.50–1.10)
GFR, EST AFRICAN AMERICAN: 26 mL/min — AB (ref >90)
GFR, EST NON AFRICAN AMERICAN: 22 mL/min — AB (ref >90)
GLUCOSE: 121 mg/dL — AB (ref 70–99)
POTASSIUM: 4.7 mmol/L (ref 3.5–5.1)
Sodium: 139 mmol/L (ref 135–145)

## 2014-06-23 LAB — URINALYSIS, ROUTINE W REFLEX MICROSCOPIC
Bilirubin Urine: NEGATIVE
Glucose, UA: NEGATIVE mg/dL
Ketones, ur: NEGATIVE mg/dL
Nitrite: NEGATIVE
PH: 7 (ref 5.0–8.0)
Protein, ur: 30 mg/dL — AB
SPECIFIC GRAVITY, URINE: 1.02 (ref 1.005–1.030)
UROBILINOGEN UA: 0.2 mg/dL (ref 0.0–1.0)

## 2014-06-23 LAB — URINE MICROSCOPIC-ADD ON

## 2014-06-23 LAB — CBC WITH DIFFERENTIAL/PLATELET
Basophils Absolute: 0 10*3/uL (ref 0.0–0.1)
Basophils Relative: 1 % (ref 0–1)
EOS PCT: 2 % (ref 0–5)
Eosinophils Absolute: 0.1 10*3/uL (ref 0.0–0.7)
HEMATOCRIT: 38.8 % (ref 36.0–46.0)
HEMOGLOBIN: 12.4 g/dL (ref 12.0–15.0)
LYMPHS ABS: 2.1 10*3/uL (ref 0.7–4.0)
Lymphocytes Relative: 32 % (ref 12–46)
MCH: 29 pg (ref 26.0–34.0)
MCHC: 32 g/dL (ref 30.0–36.0)
MCV: 90.7 fL (ref 78.0–100.0)
MONOS PCT: 10 % (ref 3–12)
Monocytes Absolute: 0.6 10*3/uL (ref 0.1–1.0)
Neutro Abs: 3.6 10*3/uL (ref 1.7–7.7)
Neutrophils Relative %: 55 % (ref 43–77)
PLATELETS: 369 10*3/uL (ref 150–400)
RBC: 4.28 MIL/uL (ref 3.87–5.11)
RDW: 15.8 % — ABNORMAL HIGH (ref 11.5–15.5)
WBC: 6.4 10*3/uL (ref 4.0–10.5)

## 2014-06-23 LAB — HEPATIC FUNCTION PANEL
ALK PHOS: 78 U/L (ref 39–117)
ALT: 9 U/L (ref 0–35)
AST: 30 U/L (ref 0–37)
Albumin: 4 g/dL (ref 3.5–5.2)
BILIRUBIN DIRECT: 0.2 mg/dL (ref 0.0–0.5)
BILIRUBIN INDIRECT: 0.4 mg/dL (ref 0.3–0.9)
BILIRUBIN TOTAL: 0.6 mg/dL (ref 0.3–1.2)
Total Protein: 7.4 g/dL (ref 6.0–8.3)

## 2014-06-23 LAB — LIPASE, BLOOD: Lipase: 77 U/L — ABNORMAL HIGH (ref 11–59)

## 2014-06-23 MED ORDER — CIPROFLOXACIN HCL 500 MG PO TABS: 500.0000 mg | ORAL_TABLET | Freq: Two times a day (BID) | ORAL | Status: DC

## 2014-06-23 MED ORDER — ONDANSETRON 4 MG PO TBDP: ORAL_TABLET | ORAL | Status: DC

## 2014-06-23 MED ORDER — SODIUM CHLORIDE 0.9 % IV BOLUS (SEPSIS)
500.0000 mL | Freq: Once | INTRAVENOUS | Status: AC
  Administered 2014-06-23: 500 mL via INTRAVENOUS

## 2014-06-23 MED ORDER — CIPROFLOXACIN IN D5W 400 MG/200ML IV SOLN
400.0000 mg | Freq: Once | INTRAVENOUS | Status: AC
  Administered 2014-06-23: 400 mg via INTRAVENOUS
  Filled 2014-06-23: qty 200

## 2014-06-23 NOTE — ED Notes (Signed)
Nausea off and on for a month.  Today nauseated all day long.  C/o pain LUQ abdomen off and on.

## 2014-06-23 NOTE — Discharge Instructions (Signed)
Follow up with dr. Luan Pulling Monday or tuesday

## 2014-06-24 ENCOUNTER — Ambulatory Visit (HOSPITAL_COMMUNITY)
Admission: RE | Admit: 2014-06-24 | Discharge: 2014-06-24 | Disposition: A | Discharge status: Home / Self Care | Payer: Commercial Managed Care - HMO | Source: Ambulatory Visit | Attending: "Endocrinology | Admitting: "Endocrinology

## 2014-06-24 DIAGNOSIS — C73 Malignant neoplasm of thyroid gland: Secondary | ICD-10-CM | POA: Diagnosis present

## 2014-06-25 NOTE — ED Provider Notes (Signed)
CSN: [id number]     Arrival date & time 06/23/14  1735 History   First MD Initiated Contact with Patient 06/23/14 1902     Chief Complaint  Patient presents with  . Nausea     (Consider location/radiation/quality/duration/timing/severity/associated sxs/prior Treatment) Patient is a 81 y.o. female presenting with weakness. The history is provided by the patient (pt complains of weakness and nauseau).  Weakness This is a new problem. The current episode started 12 to 24 hours ago. The problem occurs constantly. The problem has not changed since onset.Pertinent negatives include no chest pain, no abdominal pain and no headaches. Nothing aggravates the symptoms. Nothing relieves the symptoms.    Past Medical History  Diagnosis Date  . CA - cancer     thyroid - 2008 surg  . Hyperlipidemia   . Osteoporosis   . Pneumonia 5/13  . GERD (gastroesophageal reflux disease)   . History of migraine headaches   . Vertigo   . History of tuberculosis 1965  . Thyroid disease 2008    thyroid cancer cured with surgery  . Lung cancer 09/04/2011    non-small cell ca in L upper lobe  . Hypertension   . Diabetes mellitus     controlled with medication  . Cancer     cervical ca - 2005 - surg and radiation  . Parkinson's disease   . Dementia    Past Surgical History  Procedure Laterality Date  . Thyroidectomy  2007  . Hip arthroplasty  2010    left  . Cholecystectomy  1994  . Cataract extraction    . Lung biopsy  09/04/2011  . Abdominal hysterectomy  2005    complete   Family History  Problem Relation Age of Onset  . Lung disease    . Cancer Sister     breast  . Cancer Sister     brain  . Cancer Sister     ovarian    History  Substance Use Topics  . Smoking status: Former Smoker -- 0.25 packs/day for 20 years    Types: Cigarettes    Quit date: 03/04/1982  . Smokeless tobacco: Never Used  . Alcohol Use: No   OB History    No data available     Review of Systems   Constitutional: Negative for appetite change and fatigue.  HENT: Negative for congestion, ear discharge and sinus pressure.   Eyes: Negative for discharge.  Respiratory: Negative for cough.   Cardiovascular: Negative for chest pain.  Gastrointestinal: Negative for abdominal pain and diarrhea.  Genitourinary: Negative for frequency and hematuria.  Musculoskeletal: Negative for back pain.  Skin: Negative for rash.  Neurological: Positive for weakness. Negative for seizures and headaches.  Psychiatric/Behavioral: Negative for hallucinations.      Allergies  Amoxicillin; Statins; and Sulfonamide derivatives  Home Medications   Prior to Admission medications   Medication Sig Start Date End Date Taking? Authorizing Provider  carbidopa-levodopa (SINEMET IR) 25-100 MG per tablet Take 1 tablet by mouth 3 (three) times daily.    Yes Historical Provider, MD  Cyanocobalamin (B-12) 1000 MCG TBCR Take 1 tablet by mouth daily.   Yes Historical Provider, MD  donepezil (ARICEPT) 5 MG tablet Take 5 mg by mouth once.   Yes Historical Provider, MD  levothyroxine (SYNTHROID, LEVOTHROID) 75 MCG tablet Take 75 mcg by mouth daily before breakfast.   Yes Historical Provider, MD  meclizine (ANTIVERT) 25 MG tablet Take 25 mg by mouth 3 (three) times daily.  Yes Historical Provider, MD  metFORMIN (GLUCOPHAGE) 500 MG tablet Take 500 mg by mouth 2 (two) times daily with a meal.    Yes Historical Provider, MD  pantoprazole (PROTONIX) 40 MG tablet Take 40 mg by mouth daily. 06/08/14  Yes Historical Provider, MD  QUEtiapine (SEROQUEL) 25 MG tablet Take 25 mg by mouth at bedtime.   Yes Historical Provider, MD  rivastigmine (EXELON) 1.5 MG capsule Take 1.5 mg by mouth daily.   Yes Historical Provider, MD  rosuvastatin (CRESTOR) 20 MG tablet Take 20 mg by mouth every evening.    Yes Historical Provider, MD  traMADol (ULTRAM) 50 MG tablet Take 50 mg by mouth daily as needed for moderate pain (for pain in side).   Yes  Historical Provider, MD  ciprofloxacin (CIPRO) 500 MG tablet Take 1 tablet (500 mg total) by mouth 2 (two) times daily. One po bid x 7 days 06/23/14   Milton Ferguson, MD  nitrofurantoin, macrocrystal-monohydrate, (MACROBID) 100 MG capsule Take 1 capsule (100 mg total) by mouth 2 (two) times daily. Patient not taking: Reported on 06/23/2014 08/30/13   Tanna Furry, MD  ondansetron (ZOFRAN ODT) 4 MG disintegrating tablet '4mg'$  ODT q4 hours prn nausea/vomit 06/23/14   Milton Ferguson, MD   BP 126/64 mmHg  Pulse 69  Temp(Src) 98.3 F (36.8 C) (Oral)  Resp 20  Ht '5\' 3"'$  (1.6 m)  Wt 112 lb (50.803 kg)  BMI 19.84 kg/m2  SpO2 98% Physical Exam  Constitutional: She is oriented to person, place, and time. She appears well-developed.  HENT:  Head: Normocephalic.  Eyes: Conjunctivae and EOM are normal. No scleral icterus.  Neck: Neck supple. No thyromegaly present.  Cardiovascular: Normal rate and regular rhythm.  Exam reveals no gallop and no friction rub.   No murmur heard. Pulmonary/Chest: No stridor. She has no wheezes. She has no rales. She exhibits no tenderness.  Abdominal: She exhibits no distension. There is no tenderness. There is no rebound.  Musculoskeletal: Normal range of motion. She exhibits no edema.  Lymphadenopathy:    She has no cervical adenopathy.  Neurological: She is oriented to person, place, and time. She exhibits normal muscle tone. Coordination normal.  Skin: No rash noted. No erythema.  Psychiatric: She has a normal mood and affect. Her behavior is normal.    ED Course  Procedures (including critical care time) Labs Review Labs Reviewed  CBC WITH DIFFERENTIAL/PLATELET - Abnormal; Notable for the following:    RDW 15.8 (*)    All other components within normal limits  BASIC METABOLIC PANEL - Abnormal; Notable for the following:    Glucose, Bld 121 (*)    BUN 27 (*)    Creatinine, Ser 1.99 (*)    GFR calc non Af Amer 22 (*)    GFR calc Af Amer 26 (*)    All other  components within normal limits  URINALYSIS, ROUTINE W REFLEX MICROSCOPIC - Abnormal; Notable for the following:    APPearance HAZY (*)    Hgb urine dipstick SMALL (*)    Protein, ur 30 (*)    Leukocytes, UA LARGE (*)    All other components within normal limits  LIPASE, BLOOD - Abnormal; Notable for the following:    Lipase 77 (*)    All other components within normal limits  URINE MICROSCOPIC-ADD ON - Abnormal; Notable for the following:    Bacteria, UA MANY (*)    All other components within normal limits  URINE CULTURE  HEPATIC FUNCTION PANEL  Imaging Review Ct Abdomen Pelvis Wo Contrast  06/23/2014   CLINICAL DATA:  Intermittent nausea and left upper quadrant abdominal pain for 1 month.  EXAM: CT ABDOMEN AND PELVIS WITHOUT CONTRAST  TECHNIQUE: Multidetector CT imaging of the abdomen and pelvis was performed following the standard protocol without IV contrast.  COMPARISON:  Abdominal ultrasound- 06/02/2014; PET-CT 08/14/2011; CT abdomen pelvis - 01/12/2010; chest CT -06/07/2009  FINDINGS: The lack of intravenous contrast limits the ability to evaluate solid abdominal organs.  Normal hepatic contour.  Post cholecystectomy.  No ascites.  There are 2 punctate nonobstructing stones within the inferior pole the right kidney with dominant stone measuring 3 mm in diameter (image 30, series 2 an additional adjacent stone measuring 2 mm (image 29, series 2). No definite left-sided renal stones. No renal stones are seen along the expected course of either ureter or the urinary bladder, though note, evaluation is degraded secondary streak artifact from the patient's left hip prosthesis. Several phleboliths are seen within the lower pelvis bilaterally. Multiple surgical clips are seen within the lower pelvis bilaterally. The approximately 5.7 cm hypo attenuating (2 Hounsfield unit) lesion arising from the anterior superior aspect of the right kidney which is incompletely characterized on the present  examination though morphologically similar to prior examinations and favored to represent a renal cyst. No urinary obstruction or perinephric stranding. Normal noncontrast appearance of the bilateral adrenal glands, pancreas and spleen.  There is mild apparent wall thickening involving the proximal jejunum (representative image 20, 35 and 40, series 2) without associated adjacent mesenteric stranding. There is mild gas distention of a solitary loop of small bowel within the right mid hemi abdomen (axial image 41, series 2, coronal image 24, series 3 as well as seen on scout image). This loop of bowel is without associated wall thickening or upstream gas distention and [place] favored to represent an area of peristalsis. The bowel is otherwise normal in course and caliber. No evidence of enteric obstruction. Normal noncontrast appearance of the appendix. No pneumoperitoneum, pneumatosis or portal venous gas.  Moderate to large amount of calcified atherosclerotic plaque within a normal caliber abdominal aorta. No bulky retroperitoneal, mesenteric, pelvic or inguinal lymphadenopathy on this noncontrast examination.  Post hysterectomy. No discrete adnexal lesion though evaluation degraded secondary streak artifact from the patient's left total hip prosthesis. No free fluid in the pelvic cul-de-sac.  Limited visualization of lower thorax demonstrates the sequela of presumed radiation change involving the left hilum with associated adjacent perihilar subsegmental atelectasis and geographic traction bronchiectasis. Unchanged punctate (approximately 1 cm) nodule within the subpleural aspect of the right lower lobe (image 6, series 6), unchanged since the recently performed chest CT. There is minimal subpleural reticular opacities within the peripheral aspect of the image right middle lobe (image 2, series 6). No focal airspace opacities. No pleural effusion.  Normal heart size.  Small pericardial effusion, unchanged.  No acute  or aggressive osseous abnormalities. Unchanged moderate (approximately 40%) compression deformity involving the inferior endplate of the L5 vertebral body, similar to the 01/2010 examination. Post right bipolar hip replacement.  Regional soft tissues appear normal.  IMPRESSION: 1. Mild apparent wall thickening involving the proximal jejunum without associated mesenteric stranding or evidence of enteric obstruction. While potentially accentuated due to underdistention, an enteritis could have a similar appearance. Clinical correlation is advised. 2. Otherwise, no definite explanation for patient's intermittent nausea and left upper quadrant abdominal pain. Specifically, no evidence of enteric or urinary obstruction. 3. Right-sided nonobstructing nephrolithiasis, with dominant nonobstructing  right-sided renal stone measuring 3 mm in diameter. No evidence of left-sided nephrolithiasis. 4. Unchanged small pericardial effusion. 5. Grossly unchanged sequela of prior radiation therapy involving the left hilum. Unchanged approximately 1 cm nodule within in the image right lower lobe.   Electronically Signed   By: Sandi Mariscal M.D.   On: 06/23/2014 21:36   Dg Chest 2 View  06/23/2014   CLINICAL DATA:  Left-sided chest pain and left rib pain tonight. No injury. Nausea. History of lung cancer and thyroid cancer.  EXAM: CHEST  2 VIEW  COMPARISON:  02/28/2014 and chest CT 06/08/2014  FINDINGS: Lungs are adequately inflated without focal consolidation or effusion. There is linear scarring over the left lower lobe. Cardiomediastinal silhouette and remainder the exam is unchanged.  IMPRESSION: No active cardiopulmonary disease.   Electronically Signed   By: Marin Olp M.D.   On: 06/23/2014 20:53   US Soft Tissue Head/neck  06/24/2014   CLINICAL DATA:  Thyroid carcinoma, post thyroidectomy  EXAM: THYROID ULTRASOUND  TECHNIQUE: Ultrasound examination of the thyroid gland and adjacent soft tissues was performed.  COMPARISON:   CT 06/08/2014 and earlier studies  FINDINGS: Right thyroid lobe  Surgically absent.  No nodules visualized.  Left thyroid lobe  Surgically absent. since prior CT.  Isthmus  Surgically absent.  No nodules visualized.  Lymphadenopathy  26 x 22 x 21 mm left level IV homogeneous enlarged node  IMPRESSION: 1. Enlarged left level IV lymph node suggesting metastatic disease. This should be approachable for ultrasound-guided biopsy if clinically appropriate.   Electronically Signed   By: Lucrezia Europe M.D.   On: 06/24/2014 11:30     EKG Interpretation None      MDM   Final diagnoses:  Nausea  UTI (lower urinary tract infection)    uti     Milton Ferguson, MD 06/25/14 1731

## 2014-06-27 LAB — URINE CULTURE
Colony Count: >=100000
SPECIAL REQUESTS: NORMAL

## 2014-06-28 ENCOUNTER — Telehealth (HOSPITAL_COMMUNITY): Payer: Self-pay | Admitting: *Deleted

## 2014-07-01 ENCOUNTER — Other Ambulatory Visit (HOSPITAL_COMMUNITY): Payer: Self-pay | Admitting: "Endocrinology

## 2014-07-01 DIAGNOSIS — R599 Enlarged lymph nodes, unspecified: Secondary | ICD-10-CM

## 2014-07-05 ENCOUNTER — Ambulatory Visit (HOSPITAL_COMMUNITY)
Admission: RE | Admit: 2014-07-05 | Discharge: 2014-07-05 | Disposition: A | Discharge status: Home / Self Care | Payer: Commercial Managed Care - HMO | Source: Ambulatory Visit | Attending: "Endocrinology | Admitting: "Endocrinology

## 2014-07-05 ENCOUNTER — Encounter (HOSPITAL_COMMUNITY): Payer: Self-pay

## 2014-07-05 DIAGNOSIS — E041 Nontoxic single thyroid nodule: Secondary | ICD-10-CM | POA: Insufficient documentation

## 2014-07-05 DIAGNOSIS — R599 Enlarged lymph nodes, unspecified: Secondary | ICD-10-CM

## 2014-07-05 MED ORDER — SODIUM CHLORIDE 0.9 % IJ SOLN
INTRAMUSCULAR | Status: AC
  Filled 2014-07-05: qty 3

## 2014-07-05 MED ORDER — SODIUM CHLORIDE 0.9 % IJ SOLN
INTRAMUSCULAR | Status: AC
  Filled 2014-07-05: qty 9

## 2014-07-05 NOTE — Discharge Instructions (Signed)
Thyroid Biopsy °The thyroid gland is a butterfly-shaped gland situated in the front of the neck. It produces hormones which affect metabolism, growth and development, and body temperature. A thyroid biopsy is a procedure in which small samples of tissue or fluid are removed from the thyroid gland or mass and examined under a microscope. This test is done to determine the cause of thyroid problems, such as infection, cancer, or other thyroid problems. °There are 2 ways to obtain samples: °1. Fine needle biopsy. Samples are removed using a thin needle inserted through the skin and into the thyroid gland or mass. °2. Open biopsy. Samples are removed after a cut (incision) is made through the skin. °LET YOUR CAREGIVER KNOW ABOUT:  °· Allergies. °· Medications taken including herbs, eye drops, over-the-counter medications, and creams. °· Use of steroids (by mouth or creams). °· Previous problems with anesthetics or numbing medicine. °· Possibility of pregnancy, if this applies. °· History of blood clots (thrombophlebitis). °· History of bleeding or blood problems. °· Previous surgery. °· Other health problems. °RISKS AND COMPLICATIONS °· Bleeding from the site. The risk of bleeding is higher if you have a bleeding disorder or are taking any blood thinning medications (anticoagulants). °· Infection. °· Injury to structures near the thyroid gland. °BEFORE THE PROCEDURE  °This is a procedure that can be done as an outpatient. Confirm the time that you need to arrive for your procedure. Confirm whether there is a need to fast or withhold any medications. A blood sample may be done to determine your blood clotting time. Medicine may be given to help you relax (sedative). °PROCEDURE °Fine needle biopsy. °You will be awake during the procedure. You may be asked to lie on your back with your head tipped backward to extend your neck. Let your caregiver know if you cannot tolerate the positioning. An area on your neck will be  cleansed. A needle is inserted through the skin of your neck. You may feel a mild discomfort during this procedure. You may be asked to avoid coughing, talking, swallowing, or making sounds during some portions of the procedure. The needle is withdrawn once tissue or fluid samples have been removed. Pressure may be applied to the neck to reduce swelling and ensure that bleeding has stopped. The samples will be sent for examination.  °Open biopsy. °You will be given general anesthesia. You will be asleep during the procedure. An incision is made in your neck. A sample of thyroid tissue or the mass is removed. The tissue sample or mass will be sent for examination. The sample or mass may be examined during the biopsy. If the sample or mass contains cancer cells, some or all of the thyroid gland may be removed. The incision is closed with stitches. °AFTER THE PROCEDURE  °Your recovery will be assessed and monitored. If there are no problems, as an outpatient, you should be able to go home shortly after the procedure. °If you had a fine needle biopsy: °· You may have soreness at the biopsy site for 1 to 2 days. °If you had an open biopsy:  °· You may have soreness at the biopsy site for 3 to 4 days. °· You may have a hoarse voice or sore throat for 1 to 2 days. °Obtaining the Test Results °It is your responsibility to obtain your test results. Do not assume everything is normal if you have not heard from your caregiver or the medical facility. It is important for you to follow up   on all of your test results. °HOME CARE INSTRUCTIONS  °· Keeping your head raised on a pillow when you are lying down may ease biopsy site discomfort. °· Supporting the back of your head and neck with both hands as you sit up from a lying position may ease biopsy site discomfort. °· Only take over-the-counter or prescription medicines for pain, discomfort, or fever as directed by your caregiver. °· Throat lozenges or gargling with warm salt  water may help to soothe a sore throat. °SEEK IMMEDIATE MEDICAL CARE IF:  °· You have severe bleeding from the biopsy site. °· You have difficulty swallowing. °· You have a fever. °· You have increased pain, swelling, redness, or warmth at the biopsy site. °· You notice pus coming from the biopsy site. °· You have swollen glands (lymph nodes) in your neck. °Document Released: 12/16/2006 Document Revised: 06/15/2012 Document Reviewed: 05/13/2013 °[employer]® Patient Information ©2015 [employer]. This information is not intended to replace advice given to you by your health care provider. Make sure you discuss any questions you have with your health care provider. ° °

## 2014-08-26 ENCOUNTER — Other Ambulatory Visit (HOSPITAL_COMMUNITY): Payer: Self-pay | Admitting: "Endocrinology

## 2014-08-26 DIAGNOSIS — C73 Malignant neoplasm of thyroid gland: Secondary | ICD-10-CM

## 2014-09-02 ENCOUNTER — Encounter: Payer: Self-pay | Admitting: Gastroenterology

## 2014-09-12 ENCOUNTER — Encounter (HOSPITAL_COMMUNITY)
Admission: RE | Admit: 2014-09-12 | Discharge: 2014-09-12 | Disposition: A | Discharge status: Home / Self Care | Payer: Medicare Other | Source: Ambulatory Visit | Attending: "Endocrinology | Admitting: "Endocrinology

## 2014-09-12 DIAGNOSIS — C73 Malignant neoplasm of thyroid gland: Secondary | ICD-10-CM | POA: Diagnosis not present

## 2014-09-12 MED ORDER — THYROTROPIN ALFA 1.1 MG IM SOLR
0.9000 mg | INTRAMUSCULAR | Status: AC
  Administered 2014-09-12: 0.9 mg via INTRAMUSCULAR

## 2014-09-12 MED ORDER — SODIUM CHLORIDE 0.9 % IJ SOLN
INTRAMUSCULAR | Status: AC
  Filled 2014-09-12: qty 42

## 2014-09-12 MED ORDER — THYROTROPIN ALFA 1.1 MG IM SOLR
INTRAMUSCULAR | Status: AC
  Administered 2014-09-12: 0.9 mg via INTRAMUSCULAR
  Filled 2014-09-12: qty 0.9

## 2014-09-13 ENCOUNTER — Encounter (HOSPITAL_COMMUNITY)
Admission: RE | Admit: 2014-09-13 | Discharge: 2014-09-13 | Disposition: A | Discharge status: Home / Self Care | Payer: Medicare Other | Source: Ambulatory Visit | Attending: "Endocrinology | Admitting: "Endocrinology

## 2014-09-13 DIAGNOSIS — C73 Malignant neoplasm of thyroid gland: Secondary | ICD-10-CM | POA: Diagnosis not present

## 2014-09-13 MED ORDER — THYROTROPIN ALFA 1.1 MG IM SOLR
0.9000 mg | INTRAMUSCULAR | Status: AC
  Administered 2014-09-13: 0.9 mg via INTRAMUSCULAR

## 2014-09-14 ENCOUNTER — Encounter (HOSPITAL_COMMUNITY)
Admission: RE | Admit: 2014-09-14 | Discharge: 2014-09-14 | Disposition: A | Discharge status: Home / Self Care | Payer: Medicare Other | Source: Ambulatory Visit | Attending: "Endocrinology | Admitting: "Endocrinology

## 2014-09-14 DIAGNOSIS — C73 Malignant neoplasm of thyroid gland: Secondary | ICD-10-CM

## 2014-09-14 MED ORDER — SODIUM IODIDE I 131 CAPSULE
4.0000 | Freq: Once | INTRAVENOUS | Status: AC | PRN
  Administered 2014-09-14: 4.3 via ORAL

## 2014-09-14 MED ORDER — THYROTROPIN ALFA 1.1 MG IM SOLR: 0.9000 mg | INTRAMUSCULAR | Status: DC

## 2014-09-16 ENCOUNTER — Encounter (HOSPITAL_COMMUNITY)
Admission: RE | Admit: 2014-09-16 | Discharge: 2014-09-16 | Disposition: A | Discharge status: Home / Self Care | Payer: Medicare Other | Source: Ambulatory Visit | Attending: "Endocrinology | Admitting: "Endocrinology

## 2014-09-16 DIAGNOSIS — C73 Malignant neoplasm of thyroid gland: Secondary | ICD-10-CM | POA: Diagnosis not present

## 2014-09-26 ENCOUNTER — Ambulatory Visit (INDEPENDENT_AMBULATORY_CARE_PROVIDER_SITE_OTHER): Payer: Medicare Other | Admitting: Nurse Practitioner

## 2014-09-26 ENCOUNTER — Encounter: Payer: Self-pay | Admitting: Nurse Practitioner

## 2014-09-26 VITALS — BP 98/65 | HR 89 | Temp 97.6°F | Ht 62.0 in | Wt 105.2 lb

## 2014-09-26 DIAGNOSIS — K219 Gastro-esophageal reflux disease without esophagitis: Secondary | ICD-10-CM

## 2014-09-26 DIAGNOSIS — R112 Nausea with vomiting, unspecified: Secondary | ICD-10-CM | POA: Diagnosis not present

## 2014-09-26 NOTE — Assessment & Plan Note (Signed)
Patient with a history of GERD with new onset nausea and vomiting approximately 6 months ago. She was started on Zofran, a PPI, and has been asymptomatic for about the past month. She did admit one episode of dark stools approximately 1 month ago and is not currently on iron or was not taking Pepto at that time. At this point we will have her continue her current PPIs seems to be very effective for her. I will check a CBC and a stool for heme and have her return for follow-up in 3 months. No current red flag/warning signs or symptoms.

## 2014-09-26 NOTE — Progress Notes (Signed)
Primary Care Physician:  Alonza Bogus, MD Primary Gastroenterologist:  Dr. Oneida Alar  Chief Complaint  Patient presents with  . Nausea  . Emesis    HPI:   81 year old female referred by PCP for chronic nausea and vomiting. Was previously seen by [place] in 2008. PCP notes reviewed. Patient has a history of chronic nausea and vomiting. She has had lung cancer which is been cured and has thyroid cancer which is currently being treated. She has poorly controlled diabetes. Has a history of GERD but currently treated with Protonix 40 mg daily. No previous colonoscopy or endoscopy found in the system.  Today she states she had been having worsening N/V for about 6 months. Was started on Zofran and [place] ordered which was negative. She was started on PPI about 2 months ago and has not had any N/V in the past month. Denies hematemesis. Appetite is pretty good now but wasn't previously; "things are getting back to normal." Has some early satiety. Has a bowel movement daily and is consistent with Bristol Scale 4 without straining. Denies hematochezia. Had an episode of back, tarry "greasy looking" stools about a month ago. Denies iron or pepto use at the time. Denies chest pain, dyspnea, dizziness, lightheadedness, syncope, near syncope. Denies any other upper or lower GI symptoms.  Past Medical History  Diagnosis Date  . CA - cancer     thyroid - 2008 surg  . Hyperlipidemia   . Osteoporosis   . Pneumonia 5/13  . GERD (gastroesophageal reflux disease)   . History of migraine headaches   . Vertigo   . History of tuberculosis 1965  . Thyroid disease 2008    thyroid cancer cured with surgery  . Lung cancer 09/04/2011    non-small cell ca in L upper lobe  . Hypertension   . Diabetes mellitus     controlled with medication  . Cancer     cervical ca - 2005 - surg and radiation  . Parkinson's disease   . Dementia     Past Surgical History  Procedure Laterality Date  . Thyroidectomy  2007  . Hip  arthroplasty  2010    left  . Cholecystectomy  1994  . Cataract extraction    . Lung biopsy  09/04/2011  . Abdominal hysterectomy  2005    complete    Current Outpatient Prescriptions  Medication Sig Dispense Refill  . budesonide-formoterol (SYMBICORT) 160-4.5 MCG/ACT inhaler Inhale 2 puffs into the lungs 2 (two) times daily.    . carbidopa-levodopa (SINEMET IR) 25-100 MG per tablet Take 1 tablet by mouth 3 (three) times daily.     . Cyanocobalamin (B-12) 1000 MCG TBCR Take 1 tablet by mouth daily.    Marland Kitchen donepezil (ARICEPT) 5 MG tablet Take 5 mg by mouth once.    Marland Kitchen levothyroxine (SYNTHROID, LEVOTHROID) 75 MCG tablet Take 75 mcg by mouth daily before breakfast.    . meclizine (ANTIVERT) 25 MG tablet Take 25 mg by mouth 3 (three) times daily.     . metFORMIN (GLUCOPHAGE) 500 MG tablet Take 500 mg by mouth 2 (two) times daily with a meal.     . ondansetron (ZOFRAN ODT) 4 MG disintegrating tablet '4mg'$  ODT q4 hours prn nausea/vomit 12 tablet 0  . pantoprazole (PROTONIX) 40 MG tablet Take 40 mg by mouth daily.  0  . QUEtiapine (SEROQUEL) 25 MG tablet Take 25 mg by mouth at bedtime.    . rivastigmine (EXELON) 1.5 MG capsule Take 1.5 mg by  mouth daily.    . rosuvastatin (CRESTOR) 20 MG tablet Take 20 mg by mouth every evening.     . traMADol (ULTRAM) 50 MG tablet Take 50 mg by mouth daily as needed for moderate pain (for pain in side).    . ciprofloxacin (CIPRO) 500 MG tablet Take 1 tablet (500 mg total) by mouth 2 (two) times daily. One po bid x 7 days (Patient not taking: Reported on 09/26/2014) 14 tablet 0  . nitrofurantoin, macrocrystal-monohydrate, (MACROBID) 100 MG capsule Take 1 capsule (100 mg total) by mouth 2 (two) times daily. (Patient not taking: Reported on 06/23/2014) 10 capsule 0   No current facility-administered medications for this visit.    Allergies as of 09/26/2014 - Review Complete 09/26/2014  Allergen Reaction Noted  . Amoxicillin Nausea Only 12/09/2007  . Statins Nausea And  Vomiting 08/30/2013  . Sulfonamide derivatives Nausea And Vomiting 12/09/2007    Family History  Problem Relation Age of Onset  . Lung disease    . Cancer Sister     breast  . Cancer Sister     brain  . Cancer Sister     ovarian     History   Social History  . Marital Status: Widowed    Spouse Name: N/A  . Number of Children: N/A  . Years of Education: N/A   Occupational History  . Not on file.   Social History Main Topics  . Smoking status: Former Smoker -- 0.25 packs/day for 20 years    Types: Cigarettes    Quit date: 03/04/1982  . Smokeless tobacco: Never Used  . Alcohol Use: No  . Drug Use: No  . Sexual Activity: Not Currently   Other Topics Concern  . Not on file   Social History Narrative    Review of Systems: 10 point ROS negative except as per HPI.    Physical Exam: BP 98/65 mmHg  Pulse 89  Temp(Src) 97.6 F (36.4 C) (Oral)  Ht '5\' 2"'$  (1.575 m)  Wt 105 lb 3.2 oz (47.718 kg)  BMI 19.24 kg/m2 General:   Alert and oriented. Pleasant and cooperative. Well-nourished and well-developed.  Head:  Normocephalic and atraumatic. Eyes:  Without icterus, sclera clear and conjunctiva pink.  Ears:  Normal auditory acuity. Cardiovascular:  S1, S2 present without murmurs appreciated. Normal pulses noted. Extremities without clubbing or edema. Respiratory:  Clear to auscultation bilaterally. Minimal bilateral expiratory wheezes noted. No rales, or rhonchi. No distress.  Gastrointestinal:  +BS, soft, non-tender and non-distended. No HSM noted. No guarding or rebound. No masses appreciated.  Rectal:  Deferred  Skin:  Intact without significant lesions or rashes. Neurologic:  Alert and oriented x4;  grossly normal neurologically. Psych:  Alert and cooperative. Normal mood and affect. Heme/Lymph/Immune: No excessive bruising noted.    09/26/2014 9:52 AM

## 2014-09-26 NOTE — Patient Instructions (Signed)
1. Keep taking your acid blocking medicine like you have been taking. 2. Have your labs drawn seizure able. When you're done with the stool test he can bring it back to our office.  3. Return for a follow-up visit in 3 months.

## 2014-09-26 NOTE — Assessment & Plan Note (Addendum)
Patient with a history of GERD with new onset nausea and vomiting approximately 6 months ago. She was started on Zofran, a PPI, and has been asymptomatic for about the past month. She did admit one episode of dark stools approximately 1 month ago and is not currently on iron or was not taking Pepto at that time. At this point we will have her continue her current PPIs seems to be very effective for her. I will check a CBC and a stool for heme and have her return for follow-up in 3 months. No current red flag/warning signs or symptoms

## 2014-09-28 ENCOUNTER — Other Ambulatory Visit (HOSPITAL_COMMUNITY): Payer: Self-pay | Admitting: "Endocrinology

## 2014-09-28 DIAGNOSIS — C73 Malignant neoplasm of thyroid gland: Secondary | ICD-10-CM

## 2014-09-28 LAB — CBC WITH DIFFERENTIAL/PLATELET
Basophils Absolute: 0 10*3/uL (ref 0.0–0.1)
Basophils Relative: 0 % (ref 0–1)
EOS PCT: 2 % (ref 0–5)
Eosinophils Absolute: 0.2 10*3/uL (ref 0.0–0.7)
HCT: 31.1 % — ABNORMAL LOW (ref 36.0–46.0)
HEMOGLOBIN: 9.9 g/dL — AB (ref 12.0–15.0)
LYMPHS ABS: 1.8 10*3/uL (ref 0.7–4.0)
Lymphocytes Relative: 23 % (ref 12–46)
MCH: 27 pg (ref 26.0–34.0)
MCHC: 31.8 g/dL (ref 30.0–36.0)
MCV: 84.7 fL (ref 78.0–100.0)
MONOS PCT: 7 % (ref 3–12)
MPV: 9.3 fL (ref 8.6–12.4)
Monocytes Absolute: 0.5 10*3/uL (ref 0.1–1.0)
NEUTROS PCT: 68 % (ref 43–77)
Neutro Abs: 5.3 10*3/uL (ref 1.7–7.7)
Platelets: 411 10*3/uL — ABNORMAL HIGH (ref 150–400)
RBC: 3.67 MIL/uL — ABNORMAL LOW (ref 3.87–5.11)
RDW: 15 % (ref 11.5–15.5)
WBC: 7.8 10*3/uL (ref 4.0–10.5)

## 2014-10-03 ENCOUNTER — Other Ambulatory Visit: Payer: Self-pay

## 2014-10-03 DIAGNOSIS — D508 Other iron deficiency anemias: Secondary | ICD-10-CM

## 2014-10-05 ENCOUNTER — Encounter (HOSPITAL_COMMUNITY): Admission: RE | Admit: 2014-10-05 | Payer: Medicare Other | Source: Ambulatory Visit

## 2014-10-05 ENCOUNTER — Encounter (HOSPITAL_COMMUNITY): Payer: Self-pay

## 2014-10-05 ENCOUNTER — Encounter (HOSPITAL_COMMUNITY)
Admission: RE | Admit: 2014-10-05 | Discharge: 2014-10-05 | Disposition: A | Discharge status: Home / Self Care | Payer: Medicare Other | Source: Ambulatory Visit

## 2014-10-05 ENCOUNTER — Encounter (HOSPITAL_COMMUNITY)
Admission: RE | Admit: 2014-10-05 | Discharge: 2014-10-05 | Disposition: A | Discharge status: Home / Self Care | Payer: Medicare Other | Source: Ambulatory Visit | Attending: "Endocrinology | Admitting: "Endocrinology

## 2014-10-05 DIAGNOSIS — C73 Malignant neoplasm of thyroid gland: Secondary | ICD-10-CM

## 2014-10-05 MED ORDER — THYROTROPIN ALFA 1.1 MG IM SOLR
0.9000 mg | INTRAMUSCULAR | Status: AC
  Administered 2014-10-05: 0.9 mg via INTRAMUSCULAR

## 2014-10-06 ENCOUNTER — Encounter (HOSPITAL_COMMUNITY): Payer: Medicare Other

## 2014-10-06 ENCOUNTER — Encounter (HOSPITAL_COMMUNITY)
Admission: RE | Admit: 2014-10-06 | Discharge: 2014-10-06 | Disposition: A | Discharge status: Home / Self Care | Payer: Medicare Other | Source: Ambulatory Visit | Attending: "Endocrinology | Admitting: "Endocrinology

## 2014-10-06 ENCOUNTER — Other Ambulatory Visit (HOSPITAL_COMMUNITY): Payer: Self-pay | Admitting: "Endocrinology

## 2014-10-06 DIAGNOSIS — C73 Malignant neoplasm of thyroid gland: Secondary | ICD-10-CM

## 2014-10-06 MED ORDER — THYROTROPIN ALFA 1.1 MG IM SOLR
INTRAMUSCULAR | Status: AC
  Filled 2014-10-06: qty 0.9

## 2014-10-06 MED ORDER — THYROTROPIN ALFA 1.1 MG IM SOLR
0.9000 mg | INTRAMUSCULAR | Status: AC
  Administered 2014-10-06: 0.9 mg via INTRAMUSCULAR

## 2014-10-07 ENCOUNTER — Encounter (HOSPITAL_COMMUNITY)
Admission: RE | Admit: 2014-10-07 | Discharge: 2014-10-07 | Disposition: A | Discharge status: Home / Self Care | Payer: Medicare Other | Source: Ambulatory Visit | Attending: "Endocrinology | Admitting: "Endocrinology

## 2014-10-07 ENCOUNTER — Encounter (HOSPITAL_COMMUNITY): Payer: Medicare Other

## 2014-10-07 DIAGNOSIS — C73 Malignant neoplasm of thyroid gland: Secondary | ICD-10-CM | POA: Diagnosis not present

## 2014-10-07 MED ORDER — SODIUM IODIDE I 131 CAPSULE
175.0000 | Freq: Once | INTRAVENOUS | Status: AC | PRN
  Administered 2014-10-07: 175 via ORAL

## 2014-10-17 ENCOUNTER — Encounter (HOSPITAL_COMMUNITY): Payer: Medicare Other

## 2014-10-18 ENCOUNTER — Encounter (HOSPITAL_COMMUNITY): Payer: Self-pay

## 2014-10-18 ENCOUNTER — Encounter (HOSPITAL_COMMUNITY)
Admission: RE | Admit: 2014-10-18 | Discharge: 2014-10-18 | Disposition: A | Discharge status: Home / Self Care | Payer: Medicare Other | Source: Ambulatory Visit | Attending: "Endocrinology | Admitting: "Endocrinology

## 2014-10-18 DIAGNOSIS — C73 Malignant neoplasm of thyroid gland: Secondary | ICD-10-CM | POA: Diagnosis not present

## 2014-11-27 ENCOUNTER — Encounter (HOSPITAL_COMMUNITY): Payer: Self-pay | Admitting: *Deleted

## 2014-11-27 ENCOUNTER — Emergency Department (HOSPITAL_COMMUNITY)
Admission: EM | Admit: 2014-11-27 | Discharge: 2014-11-27 | Disposition: A | Discharge status: Home / Self Care | Payer: Medicare Other | Attending: Emergency Medicine | Admitting: Emergency Medicine

## 2014-11-27 ENCOUNTER — Emergency Department (HOSPITAL_COMMUNITY): Payer: Medicare Other

## 2014-11-27 DIAGNOSIS — K219 Gastro-esophageal reflux disease without esophagitis: Secondary | ICD-10-CM | POA: Diagnosis not present

## 2014-11-27 DIAGNOSIS — R42 Dizziness and giddiness: Secondary | ICD-10-CM | POA: Diagnosis not present

## 2014-11-27 DIAGNOSIS — Z87891 Personal history of nicotine dependence: Secondary | ICD-10-CM | POA: Insufficient documentation

## 2014-11-27 DIAGNOSIS — Z79899 Other long term (current) drug therapy: Secondary | ICD-10-CM | POA: Diagnosis not present

## 2014-11-27 DIAGNOSIS — Z8541 Personal history of malignant neoplasm of cervix uteri: Secondary | ICD-10-CM | POA: Diagnosis not present

## 2014-11-27 DIAGNOSIS — Z8701 Personal history of pneumonia (recurrent): Secondary | ICD-10-CM | POA: Diagnosis not present

## 2014-11-27 DIAGNOSIS — Z8611 Personal history of tuberculosis: Secondary | ICD-10-CM | POA: Diagnosis not present

## 2014-11-27 DIAGNOSIS — I951 Orthostatic hypotension: Secondary | ICD-10-CM | POA: Diagnosis not present

## 2014-11-27 DIAGNOSIS — E785 Hyperlipidemia, unspecified: Secondary | ICD-10-CM | POA: Insufficient documentation

## 2014-11-27 DIAGNOSIS — E119 Type 2 diabetes mellitus without complications: Secondary | ICD-10-CM | POA: Diagnosis not present

## 2014-11-27 DIAGNOSIS — G20 Parkinson's disease: Secondary | ICD-10-CM | POA: Diagnosis not present

## 2014-11-27 DIAGNOSIS — Z7982 Long term (current) use of aspirin: Secondary | ICD-10-CM | POA: Diagnosis not present

## 2014-11-27 DIAGNOSIS — Z85118 Personal history of other malignant neoplasm of bronchus and lung: Secondary | ICD-10-CM | POA: Insufficient documentation

## 2014-11-27 DIAGNOSIS — I10 Essential (primary) hypertension: Secondary | ICD-10-CM | POA: Diagnosis not present

## 2014-11-27 DIAGNOSIS — Z85850 Personal history of malignant neoplasm of thyroid: Secondary | ICD-10-CM | POA: Insufficient documentation

## 2014-11-27 DIAGNOSIS — F0390 Unspecified dementia without behavioral disturbance: Secondary | ICD-10-CM | POA: Diagnosis not present

## 2014-11-27 DIAGNOSIS — Z8739 Personal history of other diseases of the musculoskeletal system and connective tissue: Secondary | ICD-10-CM | POA: Diagnosis not present

## 2014-11-27 HISTORY — DX: Type 2 diabetes mellitus without complications: E11.9

## 2014-11-27 LAB — CBC WITH DIFFERENTIAL/PLATELET
BASOS ABS: 0 10*3/uL (ref 0.0–0.1)
Basophils Relative: 0 %
Eosinophils Absolute: 0 10*3/uL (ref 0.0–0.7)
Eosinophils Relative: 1 %
HEMATOCRIT: 31.4 % — AB (ref 36.0–46.0)
HEMOGLOBIN: 9.9 g/dL — AB (ref 12.0–15.0)
LYMPHS PCT: 24 %
Lymphs Abs: 1.2 10*3/uL (ref 0.7–4.0)
MCH: 25.7 pg — ABNORMAL LOW (ref 26.0–34.0)
MCHC: 31.5 g/dL (ref 30.0–36.0)
MCV: 81.6 fL (ref 78.0–100.0)
Monocytes Absolute: 0.5 10*3/uL (ref 0.1–1.0)
Monocytes Relative: 10 %
NEUTROS ABS: 3.3 10*3/uL (ref 1.7–7.7)
NEUTROS PCT: 65 %
PLATELETS: 246 10*3/uL (ref 150–400)
RBC: 3.85 MIL/uL — AB (ref 3.87–5.11)
RDW: 20.7 % — ABNORMAL HIGH (ref 11.5–15.5)
WBC: 5.1 10*3/uL (ref 4.0–10.5)

## 2014-11-27 LAB — URINALYSIS, ROUTINE W REFLEX MICROSCOPIC
Bilirubin Urine: NEGATIVE
GLUCOSE, UA: NEGATIVE mg/dL
Hgb urine dipstick: NEGATIVE
Ketones, ur: NEGATIVE mg/dL
LEUKOCYTES UA: NEGATIVE
Nitrite: NEGATIVE
PH: 6 (ref 5.0–8.0)
Protein, ur: NEGATIVE mg/dL
Specific Gravity, Urine: 1.02 (ref 1.005–1.030)
Urobilinogen, UA: 0.2 mg/dL (ref 0.0–1.0)

## 2014-11-27 LAB — TROPONIN I

## 2014-11-27 LAB — COMPREHENSIVE METABOLIC PANEL
ALT: 12 U/L — AB (ref 14–54)
AST: 16 U/L (ref 15–41)
Albumin: 3.6 g/dL (ref 3.5–5.0)
Alkaline Phosphatase: 90 U/L (ref 38–126)
Anion gap: 6 (ref 5–15)
BUN: 42 mg/dL — AB (ref 6–20)
CO2: 26 mmol/L (ref 22–32)
CREATININE: 1.56 mg/dL — AB (ref 0.44–1.00)
Calcium: 8.8 mg/dL — ABNORMAL LOW (ref 8.9–10.3)
Chloride: 110 mmol/L (ref 101–111)
GFR calc Af Amer: 35 mL/min — ABNORMAL LOW (ref >60)
GFR calc non Af Amer: 30 mL/min — ABNORMAL LOW (ref >60)
Glucose, Bld: 117 mg/dL — ABNORMAL HIGH (ref 65–99)
Potassium: 4.2 mmol/L (ref 3.5–5.1)
Sodium: 142 mmol/L (ref 135–145)
Total Bilirubin: 0.3 mg/dL (ref 0.3–1.2)
Total Protein: 6.8 g/dL (ref 6.5–8.1)

## 2014-11-27 MED ORDER — MECLIZINE HCL 12.5 MG PO TABS
25.0000 mg | ORAL_TABLET | Freq: Once | ORAL | Status: AC
  Administered 2014-11-27: 25 mg via ORAL
  Filled 2014-11-27: qty 2

## 2014-11-27 MED ORDER — SODIUM CHLORIDE 0.9 % IV BOLUS (SEPSIS)
1000.0000 mL | Freq: Once | INTRAVENOUS | Status: AC
  Administered 2014-11-27: 1000 mL via INTRAVENOUS

## 2014-11-27 MED ORDER — SODIUM CHLORIDE 0.9 % IV SOLN: Freq: Once | INTRAVENOUS | Status: DC

## 2014-11-27 NOTE — ED Notes (Signed)
Patient c/o dizziness when changing positions. Denies dizziness while sitting still. Denies pain, headache, visual changes, or any new weakness. Alert and in nad.

## 2014-11-27 NOTE — ED Notes (Signed)
Pt tolerated ambulation with steady gait to restroom and back to exam room. Pt c/o dizziness that "comes and goes". Pt normally uses walker but tolerated ambulation with one person assist well.

## 2014-11-27 NOTE — ED Notes (Signed)
Patient reports dizziness when changing positions for past three days. Denies dizziness at present while sitting still, denies headache or loss of vision. Denies pain. Denies any new weakness.

## 2014-11-27 NOTE — ED Notes (Signed)
Patient ambulatory to restroom without difficulty. 

## 2014-11-27 NOTE — Discharge Instructions (Signed)
Benign Positional Vertigo Vertigo means you feel like you or your surroundings are moving when they are not. Benign positional vertigo is the most common form of vertigo. Benign means that the cause of your condition is not serious. Benign positional vertigo is more common in older adults. CAUSES  Benign positional vertigo is the result of an upset in the labyrinth system. This is an area in the middle ear that helps control your balance. This may be caused by a viral infection, head injury, or repetitive motion. However, often no specific cause is found. SYMPTOMS  Symptoms of benign positional vertigo occur when you move your head or eyes in different directions. Some of the symptoms may include:  Loss of balance and falls.  Vomiting.  Blurred vision.  Dizziness.  Nausea.  Involuntary eye movements (nystagmus). DIAGNOSIS  Benign positional vertigo is usually diagnosed by physical exam. If the specific cause of your benign positional vertigo is unknown, your caregiver may perform imaging tests, such as magnetic resonance imaging (MRI) or computed tomography (CT). TREATMENT  Your caregiver may recommend movements or procedures to correct the benign positional vertigo. Medicines such as meclizine, benzodiazepines, and medicines for nausea may be used to treat your symptoms. In rare cases, if your symptoms are caused by certain conditions that affect the inner ear, you may need surgery. HOME CARE INSTRUCTIONS   Follow your caregiver's instructions.  Move slowly. Do not make sudden body or head movements.  Avoid driving.  Avoid operating heavy machinery.  Avoid performing any tasks that would be dangerous to you or others during a vertigo episode.  Drink enough fluids to keep your urine clear or pale yellow. SEEK IMMEDIATE MEDICAL CARE IF:   You develop problems with walking, weakness, numbness, or using your arms, hands, or legs.  You have difficulty speaking.  You develop  severe headaches.  Your nausea or vomiting continues or gets worse.  You develop visual changes.  Your family or friends notice any behavioral changes.  Your condition gets worse.  You have a fever.  You develop a stiff neck or sensitivity to light. MAKE SURE YOU:   Understand these instructions.  Will watch your condition.  Will get help right away if you are not doing well or get worse. Document Released: 11/26/2005 Document Revised: 05/13/2011 Document Reviewed: 11/08/2010 [facility] Patient Information 2015 [place], [place]. This information is not intended to replace advice given to you by your health care provider. Make sure you discuss any questions you have with your health care provider.   Return if worse in anyway.

## 2014-11-27 NOTE — ED Provider Notes (Signed)
CSN: [id number]     Arrival date & time 11/27/14  1354 History   First MD Initiated Contact with Patient 11/27/14 1405     Chief Complaint  Patient presents with  . Dizziness     (Consider location/radiation/quality/duration/timing/severity/associated sxs/prior Treatment) HPI Comments: Patient home with intermittent dizzy spells for the past 3 days. They're worse with position change. They're better with rest. She initially denies any history of dizziness but when asked about her meclizine prescription and she states she's had that his business for a long time. She did not take any meclizine today. She lives alone is not able to walk per her family. There is no nausea or vomiting. No fever. No vision changes. No chest pain or shortness of breath. No focal weakness, numbness or tingling. Patient with history of diabetes, previous thyroid cancer, Parkinson's and his abdomen dementia. Daughter called EMS today because she was unable to get up.   The history is provided by the patient and a relative. The history is limited by the condition of the patient.    Past Medical History  Diagnosis Date  . CA - cancer     thyroid - 2008 surg  . Hyperlipidemia   . Osteoporosis   . Pneumonia 5/13  . GERD (gastroesophageal reflux disease)   . History of migraine headaches   . Vertigo   . History of tuberculosis 1965  . Thyroid disease 2008    thyroid cancer cured with surgery  . Lung cancer 09/04/2011    non-small cell ca in L upper lobe  . Hypertension   . Diabetes mellitus     controlled with medication  . Cancer     cervical ca - 2005 - surg and radiation  . Parkinson's disease   . Dementia    Past Surgical History  Procedure Laterality Date  . Thyroidectomy  2007  . Hip arthroplasty  2010    left  . Cholecystectomy  1994  . Cataract extraction    . Lung biopsy  09/04/2011  . Abdominal hysterectomy  2005    complete   Family History  Problem Relation Age of Onset  . Lung disease     . Cancer Sister     breast  . Cancer Sister     brain  . Cancer Sister     ovarian    Social History  Substance Use Topics  . Smoking status: Former Smoker -- 0.25 packs/day for 20 years    Types: Cigarettes    Quit date: 03/04/1982  . Smokeless tobacco: Never Used  . Alcohol Use: No   OB History    No data available     Review of Systems  Constitutional: Negative for fever, activity change and appetite change.  HENT: Negative for congestion and rhinorrhea.   Respiratory: Negative for cough, chest tightness and shortness of breath.   Cardiovascular: Negative for chest pain and leg swelling.  Gastrointestinal: Negative for nausea, vomiting and abdominal pain.  Genitourinary: Negative for dysuria, hematuria, vaginal bleeding and vaginal discharge.  Musculoskeletal: Negative for myalgias and arthralgias.  Skin: Negative for wound.  Neurological: Positive for dizziness, weakness and light-headedness. Negative for headaches.  A complete 10 system review of systems was obtained and all systems are negative except as noted in the HPI and PMH.      Allergies  Amoxicillin; Statins; and Sulfonamide derivatives  Home Medications   Prior to Admission medications   Medication Sig Start Date End Date Taking? Authorizing Provider  aspirin  EC 81 MG tablet Take 81 mg by mouth daily.   Yes Historical Provider, MD  carbidopa-levodopa (SINEMET IR) 25-100 MG per tablet Take 1 tablet by mouth 3 (three) times daily.    Yes Historical Provider, MD  Cyanocobalamin (B-12) 1000 MCG TBCR Take 1 tablet by mouth daily.   Yes Historical Provider, MD  ketorolac (ACULAR) 0.5 % ophthalmic solution Place 1 drop into the left eye 3 (three) times daily. 10/25/14  Yes Historical Provider, MD  levothyroxine (SYNTHROID, LEVOTHROID) 75 MCG tablet Take 75 mcg by mouth daily before breakfast.   Yes Historical Provider, MD  linagliptin (TRADJENTA) 5 MG TABS tablet Take 5 mg by mouth daily.   Yes Historical  Provider, MD  meclizine (ANTIVERT) 25 MG tablet Take 25 mg by mouth 3 (three) times daily as needed for dizziness.    Yes Historical Provider, MD  pantoprazole (PROTONIX) 40 MG tablet Take 40 mg by mouth daily. 06/08/14  Yes Historical Provider, MD  QUEtiapine (SEROQUEL) 25 MG tablet Take 25 mg by mouth at bedtime.   Yes Historical Provider, MD  rivastigmine (EXELON) 1.5 MG capsule Take 1.5 mg by mouth daily.   Yes Historical Provider, MD  rosuvastatin (CRESTOR) 20 MG tablet Take 20 mg by mouth every evening.    Yes Historical Provider, MD  ondansetron (ZOFRAN ODT) 4 MG disintegrating tablet '4mg'$  ODT q4 hours prn nausea/vomit 06/23/14   Milton Ferguson, MD   BP 159/90 mmHg  Pulse 85  Temp(Src) 98.4 F (36.9 C) (Oral)  Resp 18  Ht '5\' 2"'$  (1.575 m)  Wt 101 lb (45.813 kg)  BMI 18.47 kg/m2  SpO2 97% Physical Exam  Constitutional: She is oriented to person, place, and time. She appears well-developed and well-nourished. No distress.  HENT:  Head: Normocephalic and atraumatic.  Mouth/Throat: Oropharynx is clear and moist. No oropharyngeal exudate.  Eyes: Conjunctivae and EOM are normal. Pupils are equal, round, and reactive to light.  Neck: Normal range of motion. Neck supple.  No meningismus.  Cardiovascular: Normal rate, regular rhythm, normal heart sounds and intact distal pulses.   No murmur heard. Pulmonary/Chest: Effort normal and breath sounds normal. No respiratory distress.  Abdominal: Soft. There is no tenderness. There is no rebound and no guarding.  Musculoskeletal: Normal range of motion. She exhibits no edema or tenderness.  Neurological: She is alert and oriented to person, place, and time. No cranial nerve deficit. She exhibits normal muscle tone. Coordination normal.  No ataxia on finger to nose bilaterally. No pronator drift. 5/5 strength throughout. CN 2-12 intact. No nystagmus. No ataxia on finger to nose. No pronator drift. Equal grip strength. Sensation intact Test of skew  negative. Head impulse testing negative.   Skin: Skin is warm.  Psychiatric: She has a normal mood and affect. Her behavior is normal.  Nursing note and vitals reviewed.   ED Course  Procedures (including critical care time) Labs Review Labs Reviewed  CBC WITH DIFFERENTIAL/PLATELET - Abnormal; Notable for the following:    RBC 3.85 (*)    Hemoglobin 9.9 (*)    HCT 31.4 (*)    MCH 25.7 (*)    RDW 20.7 (*)    All other components within normal limits  COMPREHENSIVE METABOLIC PANEL - Abnormal; Notable for the following:    Glucose, Bld 117 (*)    BUN 42 (*)    Creatinine, Ser 1.56 (*)    Calcium 8.8 (*)    ALT 12 (*)    GFR calc non Af Wyvonnia Lora  30 (*)    GFR calc Af Amer 35 (*)    All other components within normal limits  TROPONIN I  URINALYSIS, ROUTINE W REFLEX MICROSCOPIC (NOT AT [facility])    Imaging Review Ct Head Wo Contrast  11/27/2014   CLINICAL DATA:  Dizziness  EXAM: CT HEAD WITHOUT CONTRAST  TECHNIQUE: Contiguous axial images were obtained from the base of the skull through the vertex without intravenous contrast.  COMPARISON:  None.  FINDINGS: Bony calvarium is intact. No gross soft tissue abnormality is seen. Mild atrophic changes and chronic white matter ischemic changes seen commensurate with the patient's given age. No findings to suggest acute have, acute infarction or space-occupying mass lesion are noted.  IMPRESSION: Chronic atrophic and ischemic changes without acute abnormality.   Electronically Signed   By: Inez Catalina M.D.   On: 11/27/2014 15:14   I have personally reviewed and evaluated these images and lab results as part of my medical decision-making.   EKG Interpretation   Date/Time:  Sunday November 27 2014 14:08:33 EDT Ventricular Rate:  88 PR Interval:  151 QRS Duration: 94 QT Interval:  370 QTC Calculation: 448 R Axis:   -34 Text Interpretation:  Sinus rhythm Left axis deviation Borderline low  voltage, extremity leads Borderline ST depression,  lateral leads No  significant change was found Confirmed by Wyvonnia Dusky  MD, STEPHEN [phone]) on  11/27/2014 2:12:53 PM      MDM   Final diagnoses:  Vertigo  Orthostasis   Intermittent dizzy spells for the past 3 days. Dizziness is positional and worse with standing. History of vertigo in the past but has not been taking her meclizine. Denies headache, chest pain, shortness of breath. No focal weakness, numbness or tingling.  Orthostatics positive. Blood pressure 150 with lying, 100 with standing.  Patient given IV and by mouth fluids. Her labs appear to be at baseline. EKG is unchanged. CT head is stable. No focality to neurological exam.  Suspect peripheral vertigo. She also has an element of orthostasis. Doubt acute CVA. No nystagmus no ataxia. Care transferred to Dr. Lacinda Axon at shift change. Patient will need to be able to tolerate PO and ambulate before discharge.   Ezequiel Essex, MD 11/27/14 [phone]

## 2014-11-27 NOTE — ED Notes (Signed)
Patient given sprite to sip.

## 2014-11-27 NOTE — ED Provider Notes (Signed)
Patient is ambulatory without any obvious neurological deficits. CT head negative acute  Nat Christen, MD 11/27/14 1816

## 2014-12-12 ENCOUNTER — Other Ambulatory Visit: Payer: Self-pay | Admitting: Radiation Oncology

## 2014-12-12 DIAGNOSIS — C3490 Malignant neoplasm of unspecified part of unspecified bronchus or lung: Secondary | ICD-10-CM

## 2014-12-15 ENCOUNTER — Ambulatory Visit (HOSPITAL_COMMUNITY): Admission: RE | Admit: 2014-12-15 | Payer: Medicare Other | Source: Ambulatory Visit

## 2014-12-16 ENCOUNTER — Other Ambulatory Visit: Payer: Self-pay

## 2014-12-16 MED ORDER — LEVOTHYROXINE SODIUM 75 MCG PO TABS: 75.0000 ug | ORAL_TABLET | Freq: Every day | ORAL | Status: DC

## 2014-12-19 ENCOUNTER — Ambulatory Visit (HOSPITAL_COMMUNITY)
Admission: RE | Admit: 2014-12-19 | Discharge: 2014-12-19 | Disposition: A | Discharge status: Home / Self Care | Payer: Medicare Other | Source: Ambulatory Visit | Attending: Radiation Oncology | Admitting: Radiation Oncology

## 2014-12-19 DIAGNOSIS — C73 Malignant neoplasm of thyroid gland: Secondary | ICD-10-CM | POA: Insufficient documentation

## 2014-12-19 DIAGNOSIS — Z08 Encounter for follow-up examination after completed treatment for malignant neoplasm: Secondary | ICD-10-CM | POA: Diagnosis not present

## 2014-12-19 DIAGNOSIS — Z923 Personal history of irradiation: Secondary | ICD-10-CM | POA: Insufficient documentation

## 2014-12-19 DIAGNOSIS — Z8541 Personal history of malignant neoplasm of cervix uteri: Secondary | ICD-10-CM | POA: Diagnosis not present

## 2014-12-19 DIAGNOSIS — C3490 Malignant neoplasm of unspecified part of unspecified bronchus or lung: Secondary | ICD-10-CM | POA: Diagnosis not present

## 2014-12-19 DIAGNOSIS — R911 Solitary pulmonary nodule: Secondary | ICD-10-CM | POA: Diagnosis not present

## 2014-12-27 ENCOUNTER — Encounter: Payer: Self-pay | Admitting: Gastroenterology

## 2014-12-27 ENCOUNTER — Other Ambulatory Visit: Payer: Self-pay

## 2014-12-27 ENCOUNTER — Ambulatory Visit (INDEPENDENT_AMBULATORY_CARE_PROVIDER_SITE_OTHER): Payer: Medicare Other | Admitting: Gastroenterology

## 2014-12-27 VITALS — BP 114/71 | HR 87 | Temp 97.8°F | Ht 62.0 in | Wt 104.6 lb

## 2014-12-27 DIAGNOSIS — D649 Anemia, unspecified: Secondary | ICD-10-CM

## 2014-12-27 DIAGNOSIS — K219 Gastro-esophageal reflux disease without esophagitis: Secondary | ICD-10-CM

## 2014-12-27 DIAGNOSIS — R112 Nausea with vomiting, unspecified: Secondary | ICD-10-CM

## 2014-12-27 NOTE — Patient Instructions (Signed)
1. Please have your labs done in four weeks.  2. Call if recurrent nausea, abdominal pain, blood in stool or black stools, worsening lightheadedness, or shortness of breath. 3. If anemia worsens or symptoms recur, would highly recommend upper endoscopy at a minimum.

## 2014-12-27 NOTE — Progress Notes (Signed)
Primary Care Physician: Alonza Bogus, MD  Primary Gastroenterologist:  Barney Drain, MD   Chief Complaint  Patient presents with  . Follow-up    doing well    HPI: Krista [name] is a 81 y.o. female here for follow up of chronic N/V. Last seen in 09/2014. At time of last visit, N/V was improved with addition of protonix '40mg'$  daily. She has h/o previously treated cervical, thyroid, and lung cancer. Treated for recurrence of thyroid cancer (iodine treatment) this summer. EGD 2002, gastritis. TCS 2002 by Dr. Irving Shows, diverticulosis. Flex sig as outline below. Sister deceased from colon cancer at age 82.   Overall, patient reports she is feeling better. She has had some issues with vertigo, which is chronic. She reports daily symptoms. Sees Dr. Merlene Laughter for Parkinson's. BM regular. No melena, brbpr. No n/v. Reports some lightheadedness. No chest pain or SOB. No abdominal pain. No heartburn or dysphagia.   Never completed ifobt. States she can't/will not do it. Recently Hgb noted to be 9.9 in 09/2014. Was normal in 06/2014. Repeated in 11/2014 and stable at 9.9. Creatinine 1.56.    Current Outpatient Prescriptions  Medication Sig Dispense Refill  . aspirin EC 81 MG tablet Take 81 mg by mouth daily.    . carbidopa-levodopa (SINEMET IR) 25-100 MG per tablet Take 1 tablet by mouth 3 (three) times daily.     . Cyanocobalamin (B-12) 1000 MCG TBCR Take 1 tablet by mouth daily.    . DUREZOL 0.05 % EMUL     . levothyroxine (SYNTHROID, LEVOTHROID) 75 MCG tablet Take 1 tablet (75 mcg total) by mouth daily before breakfast. 30 tablet 2  . linagliptin (TRADJENTA) 5 MG TABS tablet Take 5 mg by mouth daily.    . meclizine (ANTIVERT) 25 MG tablet Take 25 mg by mouth 3 (three) times daily as needed for dizziness.     . ondansetron (ZOFRAN ODT) 4 MG disintegrating tablet '4mg'$  ODT q4 hours prn nausea/vomit 12 tablet 0  . pantoprazole (PROTONIX) 40 MG tablet Take 40 mg by mouth daily.  0  .  QUEtiapine (SEROQUEL) 25 MG tablet Take 25 mg by mouth at bedtime.    . rivastigmine (EXELON) 1.5 MG capsule Take 1.5 mg by mouth daily.    . rosuvastatin (CRESTOR) 20 MG tablet Take 20 mg by mouth every evening.      No current facility-administered medications for this visit.    Allergies as of 12/27/2014 - Review Complete 12/27/2014  Allergen Reaction Noted  . Amoxicillin Nausea Only 12/09/2007  . Statins Nausea And Vomiting 08/30/2013  . Sulfonamide derivatives Nausea And Vomiting 12/09/2007   Past Medical History  Diagnosis Date  . Hyperlipidemia   . Osteoporosis   . Pneumonia 5/13  . GERD (gastroesophageal reflux disease)   . History of migraine headaches   . Vertigo   . History of tuberculosis 1965  . Thyroid disease 2008    thyroid cancer cured with surgery  . Hypertension   . Diabetes mellitus     controlled with medication  . Parkinson's disease ([place])   . Dementia   . CA - cancer     thyroid - 2008 surg  . Lung cancer ([place]) 09/04/2011    non-small cell ca in L upper lobe  . Cancer [facility])     cervical ca - 2005 - surg and radiation   Past Surgical History  Procedure Laterality Date  . Thyroidectomy  2007  . Hip arthroplasty  2010    left  . Cholecystectomy  1994  . Cataract extraction    . Lung biopsy  09/04/2011  . Abdominal hysterectomy  2005    complete  . Flexible sigmoidoscopy  2008    SLF: 1.angulated sigmoid colon 15 cm from anal verge. thickened sigmoid colon seen on CT. sigmoid diverticulosis 2. normal retrofexed view of rectum     ROS:  General: Negative for anorexia, weight loss, fever, chills, fatigue, weakness. ENT: Negative for hoarseness, difficulty swallowing , nasal congestion. CV: Negative for chest pain, angina, palpitations, dyspnea on exertion, peripheral edema.  Respiratory: Negative for dyspnea at rest, dyspnea on exertion, cough, sputum, wheezing.  GI: See history of present illness. GU:  Negative for dysuria, hematuria, urinary  incontinence, urinary frequency, nocturnal urination.  Endo: Negative for unusual weight change. Weight stable last 3 months.    Physical Examination:   BP 114/71 mmHg  Pulse 87  Temp(Src) 97.8 F (36.6 C) (Oral)  Ht '5\' 2"'$  (1.575 m)  Wt 104 lb 9.6 oz (47.446 kg)  BMI 19.13 kg/m2  General: Well-nourished, well-developed in no acute distress. Accompanied by daughter.  Eyes: No icterus. Mouth: Oropharyngeal mucosa moist and pink , no lesions erythema or exudate. Lungs: Clear to auscultation bilaterally.  Heart: Regular rate and rhythm, no murmurs rubs or gallops.  Abdomen: Bowel sounds are normal, nontender, nondistended, no hepatosplenomegaly or masses, no abdominal bruits or hernia , no rebound or guarding.   Extremities: No lower extremity edema. No clubbing or deformities. Neuro: Alert and oriented x 4   Skin: Warm and dry, no jaundice.   Psych: Alert and cooperative, normal mood and affect. Labs:  Lab Results  Component Value Date   WBC 5.1 11/27/2014   HGB 9.9* 11/27/2014   HCT 31.4* 11/27/2014   MCV 81.6 11/27/2014   PLT 246 11/27/2014   Lab Results  Component Value Date   CREATININE 1.56* 11/27/2014   BUN 42* 11/27/2014   NA 142 11/27/2014   K 4.2 11/27/2014   CL 110 11/27/2014   CO2 26 11/27/2014   Lab Results  Component Value Date   ALT 12* 11/27/2014   AST 16 11/27/2014   ALKPHOS 90 11/27/2014   BILITOT 0.3 11/27/2014     Imaging Studies: Ct Head Wo Contrast  11/27/2014  CLINICAL DATA:  Dizziness EXAM: CT HEAD WITHOUT CONTRAST TECHNIQUE: Contiguous axial images were obtained from the base of the skull through the vertex without intravenous contrast. COMPARISON:  None. FINDINGS: Bony calvarium is intact. No gross soft tissue abnormality is seen. Mild atrophic changes and chronic white matter ischemic changes seen commensurate with the patient's given age. No findings to suggest acute have, acute infarction or space-occupying mass lesion are noted. IMPRESSION:  Chronic atrophic and ischemic changes without acute abnormality. Electronically Signed   By: Inez Catalina M.D.   On: 11/27/2014 15:14   Ct Chest Wo Contrast  12/19/2014  CLINICAL DATA:  Left lung cancer diagnosed 2013 status post radiation. Thyroid cancer diagnosed 2008 status post adjuvant I-131 therapy in 2016. History of cervical cancer diagnosed 2005. EXAM: CT CHEST WITHOUT CONTRAST TECHNIQUE: Multidetector CT imaging of the chest was performed following the standard protocol without IV contrast. COMPARISON:  CT chest dated 06/08/2014 FINDINGS: Mediastinum/Nodes: The heart is normal in size. Small pericardial effusion. Coronary atherosclerosis in the LAD and right circumflex. Atherosclerotic calcifications of the aortic arch. No suspicious mediastinal lymphadenopathy. Status post thyroidectomy. No abnormal soft tissue in the surgical bed. Lungs/Pleura: Radiation changes in  the left lower paramediastinal/infrahilar region. 10 x 6 mm nodule in the medial right lower lobe (series 3/image 35, grossly unchanged from most recent CTs, increased from 08/20/2013 and new from 2014. Two 3-4 mm nodules in the lateral right upper lobe (series 3/ image 15). Mild subpleural scarring in the lateral right middle lobe (series 3/ image 35). No pleural effusion or pneumothorax. Upper abdomen: Visualized upper abdomen is notable for vascular calcifications. Musculoskeletal: Visualized osseous structures are within normal limits. IMPRESSION: Radiation changes in the left hemithorax. Stable 10 x 6 mm nodule in the medial right lower lobe, although increased from June 2015 and new from 2014. Continued attention on follow-up is suggested. No findings specific for metastatic disease. Electronically Signed   By: Julian Hy M.D.   On: 12/19/2014 16:57

## 2014-12-28 NOTE — Assessment & Plan Note (Signed)
N/V resolved with PPI therapy. She has new onset anemia dating back to 09/2014. Stable for 3 months. No overt GI bleeding. Patient has declined ifobt. Offered DRE today with hemoccult testing but patient declined.   She is refusing EGD or colonoscopy at this time. She did agree to updating labs in four weeks. Warning signs discussed with patient. Encouraged at minimum EGD especially if change in hgb. Patient will consider but wants to wait until labs are repeated.   Continue PPI.

## 2014-12-28 NOTE — Assessment & Plan Note (Signed)
Doing well on protonix.

## 2014-12-28 NOTE — Progress Notes (Signed)
cc'ed to pcp °

## 2015-02-02 LAB — HEMOGLOBIN A1C: HEMOGLOBIN A1C: 7 % — AB (ref 4.0–6.0)

## 2015-02-09 ENCOUNTER — Encounter: Payer: Self-pay | Admitting: "Endocrinology

## 2015-02-09 ENCOUNTER — Ambulatory Visit (INDEPENDENT_AMBULATORY_CARE_PROVIDER_SITE_OTHER): Payer: Medicare Other | Admitting: Otolaryngology

## 2015-02-09 ENCOUNTER — Ambulatory Visit (INDEPENDENT_AMBULATORY_CARE_PROVIDER_SITE_OTHER): Payer: Medicare Other | Admitting: "Endocrinology

## 2015-02-09 VITALS — BP 109/67 | HR 94 | Ht 62.0 in | Wt 103.4 lb

## 2015-02-09 DIAGNOSIS — E119 Type 2 diabetes mellitus without complications: Secondary | ICD-10-CM

## 2015-02-09 DIAGNOSIS — E890 Postprocedural hypothyroidism: Secondary | ICD-10-CM

## 2015-02-09 DIAGNOSIS — C73 Malignant neoplasm of thyroid gland: Secondary | ICD-10-CM

## 2015-02-09 NOTE — Progress Notes (Signed)
Subjective:    Patient ID: Krista [name], female    DOB: April 30, 1931, PCP Alonza Bogus, MD   Past Medical History  Diagnosis Date  . Hyperlipidemia   . Osteoporosis   . Pneumonia 5/13  . GERD (gastroesophageal reflux disease)   . History of migraine headaches   . Vertigo   . History of tuberculosis 1965  . Thyroid disease 2008    thyroid cancer cured with surgery  . Hypertension   . Diabetes mellitus     controlled with medication  . Parkinson's disease ([place])   . Dementia   . CA - cancer     thyroid - 2008 surg  . Lung cancer ([place]) 09/04/2011    non-small cell ca in L upper lobe  . Cancer ([place])     cervical ca - 2005 - surg and radiation  . Hypothyroidism    Past Surgical History  Procedure Laterality Date  . Thyroidectomy  2007  . Hip arthroplasty  2010    left  . Cholecystectomy  1994  . Cataract extraction    . Lung biopsy  09/04/2011  . Abdominal hysterectomy  2005    complete  . Flexible sigmoidoscopy  2008    SLF: 1.angulated sigmoid colon 15 cm from anal verge. thickened sigmoid colon seen on CT. sigmoid diverticulosis 2. normal retrofexed view of rectum   Social History   Social History  . Marital Status: Widowed    Spouse Name: N/A  . Number of Children: N/A  . Years of Education: N/A   Social History Main Topics  . Smoking status: Former Smoker -- 0.25 packs/day for 20 years    Types: Cigarettes    Quit date: 03/04/1982  . Smokeless tobacco: Never Used  . Alcohol Use: No  . Drug Use: No  . Sexual Activity: Not Currently   Other Topics Concern  . None   Social History Narrative   Outpatient Encounter Prescriptions as of 02/09/2015  Medication Sig  . aspirin EC 81 MG tablet Take 81 mg by mouth daily.  . carbidopa-levodopa (SINEMET IR) 25-100 MG per tablet Take 1 tablet by mouth 3 (three) times daily.   . Cyanocobalamin (B-12) 1000 MCG TBCR Take 1 tablet by mouth daily.  . DUREZOL 0.05 % EMUL   . levothyroxine (SYNTHROID, LEVOTHROID)  75 MCG tablet Take 1 tablet (75 mcg total) by mouth daily before breakfast.  . linagliptin (TRADJENTA) 5 MG TABS tablet Take 5 mg by mouth daily.  . meclizine (ANTIVERT) 25 MG tablet Take 25 mg by mouth 3 (three) times daily as needed for dizziness.   . ondansetron (ZOFRAN ODT) 4 MG disintegrating tablet '4mg'$  ODT q4 hours prn nausea/vomit  . pantoprazole (PROTONIX) 40 MG tablet Take 40 mg by mouth daily.  . QUEtiapine (SEROQUEL) 25 MG tablet Take 25 mg by mouth at bedtime.  . rivastigmine (EXELON) 1.5 MG capsule Take 1.5 mg by mouth daily.  . rosuvastatin (CRESTOR) 20 MG tablet Take 20 mg by mouth every evening.    No facility-administered encounter medications on file as of 02/09/2015.   ALLERGIES: Allergies  Allergen Reactions  . Amoxicillin Nausea Only  . Statins Nausea And Vomiting  . Sulfonamide Derivatives Nausea And Vomiting   VACCINATION STATUS: There is no immunization history for the selected administration types on file for this patient.  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  Patient is here to f/u after  surgery at [facility] for  recurrent papillary thyroid cancer  and type 2 diabetes.  She has hx of thyroid cancer s/p initial thyroidectomy 10 yrs ago, then repeat surgery in May 2016 for recurrence in the cervical lymph node.   -After pretherapy WBS , which showed subtle focus of uptake within the left aspect of the thyroid bed, she received another 172 mCi of i131 ( a total so far of 521.9 mCi), followed by negative WBS for distant metastasis.  TG levels are undetectable.  She denies dysphagia, Shortness of breath. she remains on Lt4 75 mcg po qam. She also has type 2 DM x 4 yrs complicated by CKD on Tradjenta.  Her recent A1c was found to be 7% consistent with good control status. Patient denies polydipsia, polyuria, and nocturia. she has mild cognitive dysfunction, accompanied today by her daughter.  Also has HTN, HPL on  treatment. Patient denies history of CAD, CVA.  Patient is a former smoker, does not participate in a regular exercise program.  Review of Systems  Constitutional: no weight gain/loss, no fatigue, no subjective hyperthermia/hypothermia Eyes: no blurry vision, no xerophthalmia ENT: no sore throat, no nodules palpated in throat, no dysphagia/odynophagia, no hoarseness Cardiovascular: no CP/SOB/palpitations/leg swelling Respiratory: no cough/SOB Gastrointestinal: no N/V/D/C Musculoskeletal: no muscle/joint aches Skin: no rashes Neurological: no tremors/numbness/tingling/dizziness Psychiatric: no depression/anxiety  Objective:    BP 109/67 mmHg  Pulse 94  Ht '5\' 2"'$  (1.575 m)  Wt 103 lb 6.4 oz (46.902 kg)  BMI 18.91 kg/m2  SpO2 98%  Wt Readings from Last 3 Encounters:  02/09/15 103 lb 6.4 oz (46.902 kg)  12/27/14 104 lb 9.6 oz (47.446 kg)  11/27/14 101 lb (45.813 kg)    Physical Exam  Constitutional:  in NAD Eyes: PERRLA, EOMI, no exophthalmos ENT: moist mucous membranes, post thyroidectomy scar, no cervical lymphadenopathy Cardiovascular: RRR, No MRG Respiratory: CTA B Gastrointestinal: abdomen soft, NT, ND, BS+ Musculoskeletal: no deformities, strength intact in all 4 Skin: moist, warm, no rashes Neurological: no tremor with outstretched hands, DTR normal in all 4  Results for orders placed or performed in visit on 02/09/15  Hemoglobin A1c  Result Value Ref Range   Hgb A1c MFr Bld 7.0 (A) 4.0 - 6.0 %   Complete Blood Count (Most recent): Lab Results  Component Value Date   WBC 5.1 11/27/2014   HGB 9.9* 11/27/2014   HCT 31.4* 11/27/2014   MCV 81.6 11/27/2014   PLT 246 11/27/2014   Chemistry (most recent): Lab Results  Component Value Date   NA 142 11/27/2014   K 4.2 11/27/2014   CL 110 11/27/2014   CO2 26 11/27/2014   BUN 42* 11/27/2014   CREATININE 1.56* 11/27/2014   Diabetic Labs (most recent): Lab Results  Component Value Date   HGBA1C 7.0*  02/02/2015   HGBA1C * 10/08/2009    6.6 (NOTE)                                                                       According to the ADA Clinical Practice Recommendations for 2011, when HbA1c is used as a screening test:   >=6.5%   Diagnostic of Diabetes Mellitus           (if abnormal result  is confirmed)  5.7-6.4%  Increased risk of developing Diabetes Mellitus  References:Diagnosis and Classification of Diabetes Mellitus,Diabetes GEZM,6294,76(LYYTK 1):S62-S69 and Standards of Medical Care in         Diabetes - 2011,Diabetes PTWS,5681,27  (Suppl 1):S11-S61.   HGBA1C * 01/21/2009    6.5 (NOTE) The ADA recommends the following therapeutic goal for glycemic control related to Hgb A1c measurement: Goal of therapy: <6.5 Hgb A1c  Reference: American Diabetes Association: Clinical Practice Recommendations 2010, Diabetes Care, 2010, 33: (Suppl  1).   Lipid profile (most recent): Lab Results  Component Value Date   TRIG 89 01/21/2009   CHOL  01/21/2009    144        ATP III CLASSIFICATION:  <200     mg/dL   Desirable  200-239  mg/dL   Borderline High  >=240    mg/dL   High            Assessment & Plan:   1. Malignant neoplasm of thyroid gland [facility])  she underwent surgical excision of positive Lymph nodes , FNA of which was positive for recurrent adenocarcinoma consistent with papillary thyroid cancer. This was dose in [facility] for proximity reasons. Her pretherapy WBS shows subtle focus of uptake within the left aspect of thyroid bed. she received another 172 mCi of i131 ( a total so far of 521.9 mCi), followed by negative WBS for distant metastasis.  TG levels are undetectable.  2. Postsurgical hypothyroidism She is currently on 75 mcg of levothyroxine , labs are c/w appropriate replacement and will continue.  - We discussed about correct intake of levothyroxine, at fasting, with water, separated by at least 30 minutes from breakfast, and separated by more than 4 hours from calcium,  iron, multivitamins, acid reflux medications (PPIs). -Patient is made aware of the fact that thyroid hormone replacement is needed for life, dose to be adjusted by periodic monitoring of thyroid function tests.   3. Diabetes mellitus without complication ([employer])  -her  most recent a1c of 7% . Recent labs reviewed showing advanced CKD, unsafe for metformin therapy. I advised her on carbs management. Continue Tradjenta '5mg'$  po qam.  Patient denies any history of gross complications, but remains at a high risk for acute and chronic complications which include CAD, CVA, CKD, retinopathy, and neuropathy. These are all discussed in detail with the patient.  -Patient is on Statin medications and encouraged to continue to follow up with Ophthalmology at least yearly or according to recommendations, and advised to stay away from smoking.  -Patient is to return to clinic in 6 months with labs for reevaluation as well as neck and thyroid ultrasound.  she declined DXA scan screening for osteoporosis.  -She is to continue f/u with Dr. Luan Pulling for the primary care needs.   - I advised patient to maintain close follow up with HAWKINS,EDWARD L, MD for primary care needs. Follow up plan: Return in about 6 months (around 08/10/2015) for underactive thyroid, thyroid cancer, , diabetes, follow up with pre-visit labs.  Glade Lloyd, MD Phone: [phone]  Fax: [phone]   02/09/2015, 9:49 PM

## 2015-03-07 ENCOUNTER — Emergency Department (HOSPITAL_COMMUNITY)
Admission: EM | Admit: 2015-03-07 | Discharge: 2015-03-07 | Disposition: A | Discharge status: Home / Self Care | Payer: Medicare Other | Attending: Emergency Medicine | Admitting: Emergency Medicine

## 2015-03-07 ENCOUNTER — Encounter (HOSPITAL_COMMUNITY): Payer: Self-pay | Admitting: Emergency Medicine

## 2015-03-07 DIAGNOSIS — R110 Nausea: Secondary | ICD-10-CM | POA: Diagnosis present

## 2015-03-07 DIAGNOSIS — Z79899 Other long term (current) drug therapy: Secondary | ICD-10-CM | POA: Insufficient documentation

## 2015-03-07 DIAGNOSIS — Z8541 Personal history of malignant neoplasm of cervix uteri: Secondary | ICD-10-CM | POA: Diagnosis not present

## 2015-03-07 DIAGNOSIS — N390 Urinary tract infection, site not specified: Secondary | ICD-10-CM | POA: Diagnosis not present

## 2015-03-07 DIAGNOSIS — Z85118 Personal history of other malignant neoplasm of bronchus and lung: Secondary | ICD-10-CM | POA: Insufficient documentation

## 2015-03-07 DIAGNOSIS — G3183 Dementia with Lewy bodies: Secondary | ICD-10-CM | POA: Insufficient documentation

## 2015-03-07 DIAGNOSIS — Z85850 Personal history of malignant neoplasm of thyroid: Secondary | ICD-10-CM | POA: Insufficient documentation

## 2015-03-07 DIAGNOSIS — K219 Gastro-esophageal reflux disease without esophagitis: Secondary | ICD-10-CM | POA: Insufficient documentation

## 2015-03-07 DIAGNOSIS — E869 Volume depletion, unspecified: Secondary | ICD-10-CM | POA: Diagnosis not present

## 2015-03-07 DIAGNOSIS — Z8701 Personal history of pneumonia (recurrent): Secondary | ICD-10-CM | POA: Insufficient documentation

## 2015-03-07 DIAGNOSIS — I10 Essential (primary) hypertension: Secondary | ICD-10-CM | POA: Diagnosis not present

## 2015-03-07 DIAGNOSIS — Z87891 Personal history of nicotine dependence: Secondary | ICD-10-CM | POA: Insufficient documentation

## 2015-03-07 DIAGNOSIS — Z8611 Personal history of tuberculosis: Secondary | ICD-10-CM | POA: Diagnosis not present

## 2015-03-07 DIAGNOSIS — Z880 Allergy status to penicillin: Secondary | ICD-10-CM | POA: Diagnosis not present

## 2015-03-07 DIAGNOSIS — E119 Type 2 diabetes mellitus without complications: Secondary | ICD-10-CM | POA: Insufficient documentation

## 2015-03-07 DIAGNOSIS — Z7982 Long term (current) use of aspirin: Secondary | ICD-10-CM | POA: Insufficient documentation

## 2015-03-07 DIAGNOSIS — E785 Hyperlipidemia, unspecified: Secondary | ICD-10-CM | POA: Insufficient documentation

## 2015-03-07 DIAGNOSIS — E039 Hypothyroidism, unspecified: Secondary | ICD-10-CM | POA: Diagnosis not present

## 2015-03-07 LAB — URINALYSIS, ROUTINE W REFLEX MICROSCOPIC
Bilirubin Urine: NEGATIVE
GLUCOSE, UA: NEGATIVE mg/dL
NITRITE: POSITIVE — AB
PROTEIN: 30 mg/dL — AB
Specific Gravity, Urine: >1.03 — ABNORMAL HIGH (ref 1.005–1.030)
pH: 6 (ref 5.0–8.0)

## 2015-03-07 LAB — CBC
HEMATOCRIT: 33.9 % — AB (ref 36.0–46.0)
HEMOGLOBIN: 10.7 g/dL — AB (ref 12.0–15.0)
MCH: 26.9 pg (ref 26.0–34.0)
MCHC: 31.6 g/dL (ref 30.0–36.0)
MCV: 85.2 fL (ref 78.0–100.0)
Platelets: 76 10*3/uL — ABNORMAL LOW (ref 150–400)
RBC: 3.98 MIL/uL (ref 3.87–5.11)
RDW: 17 % — AB (ref 11.5–15.5)
WBC: 7.5 10*3/uL (ref 4.0–10.5)

## 2015-03-07 LAB — COMPREHENSIVE METABOLIC PANEL
ALK PHOS: 82 U/L (ref 38–126)
ALT: 13 U/L — ABNORMAL LOW (ref 14–54)
ANION GAP: 8 (ref 5–15)
AST: 18 U/L (ref 15–41)
Albumin: 3.7 g/dL (ref 3.5–5.0)
BILIRUBIN TOTAL: 0.4 mg/dL (ref 0.3–1.2)
BUN: 43 mg/dL — ABNORMAL HIGH (ref 6–20)
CALCIUM: 9.6 mg/dL (ref 8.9–10.3)
CO2: 26 mmol/L (ref 22–32)
Chloride: 109 mmol/L (ref 101–111)
Creatinine, Ser: 1.67 mg/dL — ABNORMAL HIGH (ref 0.44–1.00)
GFR calc non Af Amer: 28 mL/min — ABNORMAL LOW (ref >60)
GFR, EST AFRICAN AMERICAN: 32 mL/min — AB (ref >60)
Glucose, Bld: 198 mg/dL — ABNORMAL HIGH (ref 65–99)
Potassium: 4.6 mmol/L (ref 3.5–5.1)
Sodium: 143 mmol/L (ref 135–145)
TOTAL PROTEIN: 7.1 g/dL (ref 6.5–8.1)

## 2015-03-07 LAB — URINE MICROSCOPIC-ADD ON

## 2015-03-07 MED ORDER — SODIUM CHLORIDE 0.9 % IV BOLUS (SEPSIS)
1000.0000 mL | Freq: Once | INTRAVENOUS | Status: AC
  Administered 2015-03-07: 1000 mL via INTRAVENOUS

## 2015-03-07 MED ORDER — ONDANSETRON 8 MG PO TBDP: 4.0000 mg | ORAL_TABLET | Freq: Three times a day (TID) | ORAL | Status: AC | PRN

## 2015-03-07 MED ORDER — SODIUM CHLORIDE 0.9 % IV BOLUS (SEPSIS)
500.0000 mL | Freq: Once | INTRAVENOUS | Status: AC
  Administered 2015-03-07: 21:00:00 via INTRAVENOUS

## 2015-03-07 MED ORDER — DEXTROSE 5 % IV SOLN
1.0000 g | Freq: Once | INTRAVENOUS | Status: AC
  Administered 2015-03-07: 1 g via INTRAVENOUS
  Filled 2015-03-07: qty 10

## 2015-03-07 MED ORDER — CEPHALEXIN 500 MG PO CAPS: 500.0000 mg | ORAL_CAPSULE | Freq: Four times a day (QID) | ORAL | Status: DC

## 2015-03-07 NOTE — ED Notes (Signed)
Patient able to retain po fluids

## 2015-03-07 NOTE — Discharge Instructions (Signed)
Please drink plenty of fluids.  Recheck with Dr. Luan Pulling in the next 1-2 days.  Return if worse at any time.   Urinary Tract Infection A urinary tract infection (UTI) can occur any place along the urinary tract. The tract includes the kidneys, ureters, bladder, and urethra. A type of germ called bacteria often causes a UTI. UTIs are often helped with antibiotic medicine.  HOME CARE   If given, take antibiotics as told by your doctor. Finish them even if you start to feel better.  Drink enough fluids to keep your pee (urine) clear or pale yellow.  Avoid tea, drinks with caffeine, and bubbly (carbonated) drinks.  Pee often. Avoid holding your pee in for a long time.  Pee before and after having sex (intercourse).  Wipe from front to back after you poop (bowel movement) if you are a woman. Use each tissue only once. GET HELP RIGHT AWAY IF:   You have back pain.  You have lower belly (abdominal) pain.  You have chills.  You feel sick to your stomach (nauseous).  You throw up (vomit).  Your burning or discomfort with peeing does not go away.  You have a fever.  Your symptoms are not better in 3 days. MAKE SURE YOU:   Understand these instructions.  Will watch your condition.  Will get help right away if you are not doing well or get worse.   This information is not intended to replace advice given to you by your health care provider. Make sure you discuss any questions you have with your health care provider.   Document Released: 08/07/2007 Document Revised: 03/11/2014 Document Reviewed: 09/19/2011 Elsevier Interactive Patient Education [employer].

## 2015-03-07 NOTE — ED Notes (Addendum)
Having dizziness and weakness for last 2 days.  Denies any pain at this time.  Seen at [facility] today and told to come to ED d/t dehydration.

## 2015-03-07 NOTE — ED Provider Notes (Signed)
CSN: [id number]     Arrival date & time 03/07/15  1714 History   First MD Initiated Contact with Patient 03/07/15 1747     Chief Complaint  Patient presents with  . Dehydration  . Dizziness    for 2 days     (Consider location/radiation/quality/duration/timing/severity/associated sxs/prior Treatment) HPI  82 year old female who comes in today stating that she has been dizzy for the past 2 days. She describes this as becoming lightheaded on standing. She denies any spinning type symptoms. She has not had any lateralized weakness. She has had nausea but no vomiting. She has had decreased by mouth intake today but has been drinking fluids. She lives alone and has been getting around as usual. Her daughter took her to urgent care today. They checked her orthostatic blood pressures and told her that she was dehydrated and to come to the ED. She denies any fever, chills, headache, head injury, neck pain, chest pain, dyspnea, abdominal pain, diarrhea, or musculoskeletal complaints.  Past Medical History  Diagnosis Date  . Hyperlipidemia   . Osteoporosis   . Pneumonia 5/13  . GERD (gastroesophageal reflux disease)   . History of migraine headaches   . Vertigo   . History of tuberculosis 1965  . Thyroid disease 2008    thyroid cancer cured with surgery  . Hypertension   . Diabetes mellitus     controlled with medication  . Parkinson's disease ([place])   . Dementia   . CA - cancer     thyroid - 2008 surg  . Lung cancer ([place]) 09/04/2011    non-small cell ca in L upper lobe  . Cancer ([place])     cervical ca - 2005 - surg and radiation  . Hypothyroidism    Past Surgical History  Procedure Laterality Date  . Thyroidectomy  2007  . Hip arthroplasty  2010    left  . Cholecystectomy  1994  . Cataract extraction    . Lung biopsy  09/04/2011  . Abdominal hysterectomy  2005    complete  . Flexible sigmoidoscopy  2008    SLF: 1.angulated sigmoid colon 15 cm from anal verge. thickened sigmoid colon  seen on CT. sigmoid diverticulosis 2. normal retrofexed view of rectum   Family History  Problem Relation Age of Onset  . Lung disease    . Cancer Sister     breast  . Cancer Sister     brain  . Cancer Sister     ovarian    Social History  Substance Use Topics  . Smoking status: Former Smoker -- 0.25 packs/day for 20 years    Types: Cigarettes    Quit date: 03/04/1982  . Smokeless tobacco: Never Used  . Alcohol Use: No   OB History    No data available     Review of Systems  All other systems reviewed and are negative.     Allergies  Amoxicillin; Statins; and Sulfonamide derivatives  Home Medications   Prior to Admission medications   Medication Sig Start Date End Date Taking? Authorizing Provider  aspirin EC 81 MG tablet Take 81 mg by mouth daily.    Historical Provider, MD  carbidopa-levodopa (SINEMET IR) 25-100 MG per tablet Take 1 tablet by mouth 3 (three) times daily.     Historical Provider, MD  Cyanocobalamin (B-12) 1000 MCG TBCR Take 1 tablet by mouth daily.    Historical Provider, MD  DUREZOL 0.05 % EMUL  09/28/14   Historical Provider, MD  levothyroxine (  SYNTHROID, LEVOTHROID) 75 MCG tablet Take 1 tablet (75 mcg total) by mouth daily before breakfast. 12/16/14   Cassandria Anger, MD  linagliptin (TRADJENTA) 5 MG TABS tablet Take 5 mg by mouth daily.    Historical Provider, MD  meclizine (ANTIVERT) 25 MG tablet Take 25 mg by mouth 3 (three) times daily as needed for dizziness.     Historical Provider, MD  ondansetron (ZOFRAN ODT) 4 MG disintegrating tablet '4mg'$  ODT q4 hours prn nausea/vomit 06/23/14   Milton Ferguson, MD  pantoprazole (PROTONIX) 40 MG tablet Take 40 mg by mouth daily. 06/08/14   Historical Provider, MD  QUEtiapine (SEROQUEL) 25 MG tablet Take 25 mg by mouth at bedtime.    Historical Provider, MD  rivastigmine (EXELON) 1.5 MG capsule Take 1.5 mg by mouth daily.    Historical Provider, MD  rosuvastatin (CRESTOR) 20 MG tablet Take 20 mg by mouth  every evening.     Historical Provider, MD   BP 144/71 mmHg  Pulse 82  Temp(Src) 98.6 F (37 C) (Oral)  Resp 17  Ht '5\' 2"'$  (1.575 m)  Wt 46.72 kg  BMI 18.83 kg/m2  SpO2 97% Physical Exam  Constitutional: She is oriented to person, place, and time. She appears well-developed and well-nourished.  HENT:  Head: Normocephalic and atraumatic.  Right Ear: External ear normal.  Left Ear: External ear normal.  Nose: Nose normal.  Mucous membranes appear dry and tongue is somewhat discolored  Eyes: Conjunctivae and EOM are normal. Pupils are equal, round, and reactive to light.  Neck: Normal range of motion. Neck supple. No JVD present. No tracheal deviation present. No thyromegaly present.  Cardiovascular: Normal rate, regular rhythm, normal heart sounds and intact distal pulses.   Pulmonary/Chest: Effort normal and breath sounds normal. She has no wheezes.  Abdominal: Soft. Bowel sounds are normal. She exhibits no mass. There is no tenderness. There is no guarding.  Musculoskeletal: Normal range of motion.  Lymphadenopathy:    She has no cervical adenopathy.  Neurological: She is alert and oriented to person, place, and time. She has normal reflexes. No cranial nerve deficit or sensory deficit. Gait normal. GCS eye subscore is 4. GCS verbal subscore is 5. GCS motor subscore is 6.  Reflex Scores:      Bicep reflexes are 2+ on the right side and 2+ on the left side.      Patellar reflexes are 2+ on the right side and 2+ on the left side. Strength is normal and equal throughout. Cranial nerves grossly intact. Patient fluent. No gross ataxia and patient able to ambulate without difficulty.  Skin: Skin is warm and dry.  Psychiatric: She has a normal mood and affect. Her behavior is normal. Judgment and thought content normal.  Nursing note and vitals reviewed.   ED Course  Procedures (including critical care time) Labs Review Labs Reviewed  CBC - Abnormal; Notable for the following:     Hemoglobin 10.7 (*)    HCT 33.9 (*)    RDW 17.0 (*)    Platelets 76 (*)    All other components within normal limits  COMPREHENSIVE METABOLIC PANEL - Abnormal; Notable for the following:    Glucose, Bld 198 (*)    BUN 43 (*)    Creatinine, Ser 1.67 (*)    ALT 13 (*)    GFR calc non Af Amer 28 (*)    GFR calc Af Amer 32 (*)    All other components within normal limits  URINALYSIS, ROUTINE  W REFLEX MICROSCOPIC (NOT AT [facility]) - Abnormal; Notable for the following:    APPearance HAZY (*)    Specific Gravity, Urine >1.030 (*)    Hgb urine dipstick TRACE (*)    Ketones, ur TRACE (*)    Protein, ur 30 (*)    Nitrite POSITIVE (*)    Leukocytes, UA LARGE (*)    All other components within normal limits  URINE MICROSCOPIC-ADD ON - Abnormal; Notable for the following:    Squamous Epithelial / LPF 6-30 (*)    Bacteria, UA MANY (*)    All other components within normal limits  URINE CULTURE    Imaging Review No results found. I have personally reviewed and evaluated these images and lab results as part of my medical decision-making.   EKG Interpretation   Date/Time:  Tuesday March 07 2015 17:59:56 EST Ventricular Rate:  95 PR Interval:  146 QRS Duration: 86 QT Interval:  366 QTC Calculation: 460 R Axis:   -66 Text Interpretation:  Sinus rhythm Ventricular premature complex Left  anterior fascicular block Low voltage, precordial leads Abnormal R-wave  progression, late transition Borderline repolarization abnormality ED  PHYSICIAN INTERPRETATION AVAILABLE IN [facility] Confirmed by TEST,  Record ([id number]) on 03/08/2015 6:42:38 AM      MDM   Final diagnoses:  UTI (lower urinary tract infection)  Volume depletion   orthostatic vital signs obtained and significant for blood pressure decreasing to 75/40 on standing. IV fluids are ordered and laboratories are ordered.  Patient hydrated here with normalization of bp.  Labs checked and c.w. Mild volume depletion.  MIld anemia  and thrombocytopenia present. Discussed lab results with patient and daughter.  Discussed need for ongoing oral hydration and return precautions as well as close follow up. RX for keflex for uti.    Pattricia Boss, MD 03/09/15 [phone]

## 2015-03-09 LAB — URINE CULTURE

## 2015-03-20 ENCOUNTER — Other Ambulatory Visit: Payer: Self-pay | Admitting: "Endocrinology

## 2015-03-22 ENCOUNTER — Ambulatory Visit (INDEPENDENT_AMBULATORY_CARE_PROVIDER_SITE_OTHER): Payer: Medicare Other | Admitting: Nurse Practitioner

## 2015-03-22 ENCOUNTER — Encounter: Payer: Self-pay | Admitting: Nurse Practitioner

## 2015-03-22 VITALS — BP 113/68 | HR 83 | Temp 97.8°F | Ht 62.0 in | Wt 101.8 lb

## 2015-03-22 DIAGNOSIS — R682 Dry mouth, unspecified: Secondary | ICD-10-CM

## 2015-03-22 NOTE — Assessment & Plan Note (Signed)
Patient with complaints of dry mouth. Mucosal membranes and skin turgor consistent with mild dehydration. Advised her she is to drink more water. She states she does not drink very much water at all. She can also try hard candies to help keep her mouth moist. Discussed tricks to help her remember to drink water including taking a drink of water at the start of every TV program, using a water bottle as a goal to consume completely by the end of the day, and others. Return for follow-up as needed for any GI issues.

## 2015-03-22 NOTE — Progress Notes (Signed)
Referring Provider: Sinda Du, MD Primary Care Physician:  Alonza Bogus, MD Primary GI:  Dr. Oneida Alar  Chief Complaint  Patient presents with  . Follow-up    HPI:   82 year old female presents for evaluation of "coughing up black stuff" and dry mouth. She was last seen in our office on 12/27/2014 for GERD, normocytic anemia, and 9 intractable nausea and vomiting. She has a history of previously treated cervical, thyroid, and lung cancer. Recurrence of thyroid cancer retreatment by iodine in the summer of 2016. EGD and colonoscopy last done 2002 which found gastritis, diverticulosis. Only history colon cancer deceased sister age 33. At her last visit she is feeling much better related to her GI symptoms. She had not completed her iFOBT and stated she would not do it. She also refused digital rectal exam, colonoscopy, and EGD. Hemoglobin averaging in the upper 9's.   Today she states the coughing with productive brown phlegm but that has self resolved. Denies abdominal pain. N/V drastically improved, only occurs occasionally. Has consistent dry mouth. Daughter states she doesn't drink enough fluids. Patient says she drinks when she thinks about it. Denies hematochezia, melena. Denies chest pain, dyspnea, dizziness, lightheadedness, syncope, near syncope. Denies any other upper or lower GI symptoms.   Past Medical History  Diagnosis Date  . Hyperlipidemia   . Osteoporosis   . Pneumonia 5/13  . GERD (gastroesophageal reflux disease)   . History of migraine headaches   . Vertigo   . History of tuberculosis 1965  . Thyroid disease 2008    thyroid cancer cured with surgery  . Hypertension   . Diabetes mellitus     controlled with medication  . Parkinson's disease ([place])   . Dementia   . CA - cancer     thyroid - 2008 surg  . Lung cancer ([place]) 09/04/2011    non-small cell ca in L upper lobe  . Cancer ([place])     cervical ca - 2005 - surg and radiation  . Hypothyroidism      Past Surgical History  Procedure Laterality Date  . Thyroidectomy  2007  . Hip arthroplasty  2010    left  . Cholecystectomy  1994  . Cataract extraction    . Lung biopsy  09/04/2011  . Abdominal hysterectomy  2005    complete  . Flexible sigmoidoscopy  2008    SLF: 1.angulated sigmoid colon 15 cm from anal verge. thickened sigmoid colon seen on CT. sigmoid diverticulosis 2. normal retrofexed view of rectum    Current Outpatient Prescriptions  Medication Sig Dispense Refill  . aspirin EC 81 MG tablet Take 81 mg by mouth daily.    . carbidopa-levodopa (SINEMET IR) 25-100 MG per tablet Take 1 tablet by mouth 3 (three) times daily.     . Cyanocobalamin (B-12) 1000 MCG TBCR Take 1 tablet by mouth daily.    Marland Kitchen ketorolac (ACULAR) 0.5 % ophthalmic solution Place 1 drop into both eyes every evening.     Marland Kitchen levothyroxine (SYNTHROID, LEVOTHROID) 75 MCG tablet TAKE 1 TABLET BY MOUTH DAILY BEFORE BREAKFAST. 30 tablet 2  . meclizine (ANTIVERT) 25 MG tablet Take 25 mg by mouth 3 (three) times daily as needed for dizziness.     . ondansetron (ZOFRAN ODT) 8 MG disintegrating tablet Take 0.5 tablets (4 mg total) by mouth every 8 (eight) hours as needed for nausea or vomiting. 20 tablet 0  . pantoprazole (PROTONIX) 40 MG tablet Take 40 mg by mouth daily.  0  .  QUEtiapine (SEROQUEL) 25 MG tablet Take 25 mg by mouth at bedtime.    . rivastigmine (EXELON) 1.5 MG capsule Take 1.5 mg by mouth daily.    . rosuvastatin (CRESTOR) 20 MG tablet Take 20 mg by mouth every evening.     . TRADJENTA 5 MG TABS tablet TAKE (1) TABLET BY MOUTH EACH MORNING. 30 tablet 2  . cephALEXin (KEFLEX) 500 MG capsule Take 1 capsule (500 mg total) by mouth 4 (four) times daily. (Patient not taking: Reported on 03/22/2015) 20 capsule 0   No current facility-administered medications for this visit.    Allergies as of 03/22/2015 - Review Complete 03/22/2015  Allergen Reaction Noted  . Amoxicillin Nausea Only 12/09/2007  . Statins  Nausea And Vomiting 08/30/2013  . Sulfonamide derivatives Nausea And Vomiting 12/09/2007    Family History  Problem Relation Age of Onset  . Lung disease    . Cancer Sister     breast  . Cancer Sister     brain  . Cancer Sister     ovarian     Social History   Social History  . Marital Status: Widowed    Spouse Name: N/A  . Number of Children: N/A  . Years of Education: N/A   Social History Main Topics  . Smoking status: Former Smoker -- 0.25 packs/day for 20 years    Types: Cigarettes    Quit date: 03/04/1982  . Smokeless tobacco: Never Used  . Alcohol Use: No  . Drug Use: No  . Sexual Activity: Not Currently   Other Topics Concern  . None   Social History Narrative    Review of Systems: 10-point ROS negative except as per HPI.   Physical Exam: BP 113/68 mmHg  Pulse 83  Temp(Src) 97.8 F (36.6 C) (Oral)  Ht '5\' 2"'$  (1.575 m)  Wt 101 lb 12.8 oz (46.176 kg)  BMI 18.61 kg/m2 General:   Alert and oriented. Pleasant and cooperative. Well-nourished and well-developed.  Eyes:  Without icterus, sclera clear and conjunctiva pink.  Mouth:  No deformity or lesions, oral mucosal membranes and tongue appear dry.  Cardiovascular:  S1, S2 present without murmurs appreciated. Extremities without clubbing or edema. Respiratory:  Clear to auscultation bilaterally. No wheezes, rales, or rhonchi. No distress.  Gastrointestinal:  +BS, soft, non-tender and non-distended. No HSM noted. No guarding or rebound. No masses appreciated.  Rectal:  Deferred  Skin:  Intact without significant lesions or rashes. Skin tugor appears consistent with some dehydration. Neurologic:  Alert and oriented x4;  grossly normal neurologically. Psych:  Alert and cooperative. Normal mood and affect. Heme/Lymph/Immune: No excessive bruising noted.    03/22/2015 1:41 PM

## 2015-03-22 NOTE — Patient Instructions (Signed)
1. As we discussed, you need to drink more water. He should pain to drink at least 30-40 ounces of water a day. 2. As we discussed, use a water bottle as a goal to drink completely during the day. 3. You can also try hard candies to help with saliva production to help keep you mouth moist. 4. Return for follow-up as needed for any stomach or colon problems

## 2015-03-23 NOTE — Progress Notes (Signed)
cc'ed to pcp °

## 2015-06-12 ENCOUNTER — Other Ambulatory Visit: Payer: Self-pay | Admitting: Radiation Oncology

## 2015-06-12 DIAGNOSIS — C3490 Malignant neoplasm of unspecified part of unspecified bronchus or lung: Secondary | ICD-10-CM

## 2015-06-14 ENCOUNTER — Ambulatory Visit (HOSPITAL_COMMUNITY)
Admission: RE | Admit: 2015-06-14 | Discharge: 2015-06-14 | Disposition: A | Discharge status: Home / Self Care | Payer: Medicare Other | Source: Ambulatory Visit | Attending: Radiation Oncology | Admitting: Radiation Oncology

## 2015-06-14 DIAGNOSIS — C3490 Malignant neoplasm of unspecified part of unspecified bronchus or lung: Secondary | ICD-10-CM | POA: Diagnosis present

## 2015-06-14 DIAGNOSIS — I2510 Atherosclerotic heart disease of native coronary artery without angina pectoris: Secondary | ICD-10-CM | POA: Diagnosis not present

## 2015-06-14 DIAGNOSIS — R911 Solitary pulmonary nodule: Secondary | ICD-10-CM | POA: Insufficient documentation

## 2015-06-14 DIAGNOSIS — Z923 Personal history of irradiation: Secondary | ICD-10-CM | POA: Diagnosis present

## 2015-06-15 ENCOUNTER — Other Ambulatory Visit: Payer: Self-pay | Admitting: "Endocrinology

## 2015-06-19 ENCOUNTER — Encounter (HOSPITAL_COMMUNITY): Payer: Self-pay | Admitting: Emergency Medicine

## 2015-06-19 ENCOUNTER — Emergency Department (HOSPITAL_COMMUNITY): Payer: Medicare Other

## 2015-06-19 ENCOUNTER — Emergency Department (HOSPITAL_COMMUNITY)
Admission: EM | Admit: 2015-06-19 | Discharge: 2015-06-19 | Disposition: A | Discharge status: Home / Self Care | Payer: Medicare Other | Attending: Emergency Medicine | Admitting: Emergency Medicine

## 2015-06-19 DIAGNOSIS — E039 Hypothyroidism, unspecified: Secondary | ICD-10-CM | POA: Diagnosis not present

## 2015-06-19 DIAGNOSIS — Y929 Unspecified place or not applicable: Secondary | ICD-10-CM | POA: Insufficient documentation

## 2015-06-19 DIAGNOSIS — S3992XA Unspecified injury of lower back, initial encounter: Secondary | ICD-10-CM | POA: Diagnosis present

## 2015-06-19 DIAGNOSIS — W1839XA Other fall on same level, initial encounter: Secondary | ICD-10-CM | POA: Insufficient documentation

## 2015-06-19 DIAGNOSIS — Z7982 Long term (current) use of aspirin: Secondary | ICD-10-CM | POA: Insufficient documentation

## 2015-06-19 DIAGNOSIS — Z85118 Personal history of other malignant neoplasm of bronchus and lung: Secondary | ICD-10-CM | POA: Insufficient documentation

## 2015-06-19 DIAGNOSIS — S32019A Unspecified fracture of first lumbar vertebra, initial encounter for closed fracture: Secondary | ICD-10-CM | POA: Insufficient documentation

## 2015-06-19 DIAGNOSIS — E119 Type 2 diabetes mellitus without complications: Secondary | ICD-10-CM | POA: Insufficient documentation

## 2015-06-19 DIAGNOSIS — Z87891 Personal history of nicotine dependence: Secondary | ICD-10-CM | POA: Diagnosis not present

## 2015-06-19 DIAGNOSIS — S32000A Wedge compression fracture of unspecified lumbar vertebra, initial encounter for closed fracture: Secondary | ICD-10-CM

## 2015-06-19 DIAGNOSIS — Y939 Activity, unspecified: Secondary | ICD-10-CM | POA: Diagnosis not present

## 2015-06-19 DIAGNOSIS — Z79899 Other long term (current) drug therapy: Secondary | ICD-10-CM | POA: Insufficient documentation

## 2015-06-19 DIAGNOSIS — Z7984 Long term (current) use of oral hypoglycemic drugs: Secondary | ICD-10-CM | POA: Diagnosis not present

## 2015-06-19 DIAGNOSIS — N390 Urinary tract infection, site not specified: Secondary | ICD-10-CM | POA: Diagnosis not present

## 2015-06-19 DIAGNOSIS — Y999 Unspecified external cause status: Secondary | ICD-10-CM | POA: Diagnosis not present

## 2015-06-19 DIAGNOSIS — E785 Hyperlipidemia, unspecified: Secondary | ICD-10-CM | POA: Diagnosis not present

## 2015-06-19 DIAGNOSIS — I10 Essential (primary) hypertension: Secondary | ICD-10-CM | POA: Insufficient documentation

## 2015-06-19 HISTORY — DX: Parkinson's disease: G20

## 2015-06-19 HISTORY — DX: Parkinson's disease without dyskinesia, without mention of fluctuations: G20.A1

## 2015-06-19 LAB — URINALYSIS, ROUTINE W REFLEX MICROSCOPIC
BILIRUBIN URINE: NEGATIVE
GLUCOSE, UA: NEGATIVE mg/dL
KETONES UR: NEGATIVE mg/dL
Nitrite: NEGATIVE
PROTEIN: 30 mg/dL — AB
Specific Gravity, Urine: 1.015 (ref 1.005–1.030)
pH: 6 (ref 5.0–8.0)

## 2015-06-19 LAB — URINE MICROSCOPIC-ADD ON: Squamous Epithelial / LPF: NONE SEEN

## 2015-06-19 MED ORDER — ACETAMINOPHEN 500 MG PO TABS
1000.0000 mg | ORAL_TABLET | Freq: Once | ORAL | Status: AC
  Administered 2015-06-19: 1000 mg via ORAL
  Filled 2015-06-19: qty 2

## 2015-06-19 MED ORDER — DEXTROSE 5 % IV SOLN
1.0000 g | Freq: Once | INTRAVENOUS | Status: AC
  Administered 2015-06-19: 1 g via INTRAVENOUS
  Filled 2015-06-19: qty 10

## 2015-06-19 MED ORDER — CEPHALEXIN 500 MG PO CAPS: 500.0000 mg | ORAL_CAPSULE | Freq: Four times a day (QID) | ORAL | Status: DC

## 2015-06-19 MED ORDER — SODIUM CHLORIDE 0.9 % IV SOLN
Freq: Once | INTRAVENOUS | Status: AC
  Administered 2015-06-19: 75 mL/h via INTRAVENOUS

## 2015-06-19 NOTE — ED Notes (Signed)
Patient with c/o fall last night. Daughter reports her mother took her night medications. Started c/o "noises" in ears and went to call daughter from hallway. Daughter found patient leaning against wall, sliding down. Daughter got patient upright and back to bed. States the noises sounded like sirens going off in her ear.

## 2015-06-19 NOTE — Discharge Instructions (Signed)
Your urine sample shows infection Keflex 4 times daily for 10 days  Your xray shows a lower back compression fracture Your doctor should see you in next 2-3 days and may need to refer you for Kyphoplasty if the pain continues

## 2015-06-19 NOTE — ED Notes (Signed)
Pt ambulated to the bathroom.  States she feels much better than when she arrived.

## 2015-06-19 NOTE — ED Provider Notes (Signed)
CSN: [id number]     Arrival date & time 06/19/15  3267 History  By signing my name below, I, Stephania Fragmin, attest that this documentation has been prepared under the direction and in the presence of Noemi Chapel, MD. Electronically Signed: Stephania Fragmin, ED Scribe. 06/19/2015. 12:15 PM.   Chief Complaint  Patient presents with  . Fall   The history is provided by a relative. No language interpreter was used.    HPI Comments: Yolette Hastings is a 82 y.o. female with a history of dementia, Parkinson's disease, DM, and dementia, who presents to the Emergency Department s/p a fall that occurred last night, 11 hours ago. Patient's daughter had gone to check on patient last night and found her standing in the doorway. She then appeared to lose her balance and fell down, striking her back and left arm on the doorway, before her daughter could catch her. Her daughter notes "carpet burn"-looking skin tears on her left arm and back. Her daughter reports paient had slept through the night but woke up in the morning complaining of not being able to stand up secondary to her back pain. She states patient is at baseline mentation. Her daughter states she has been eating normally today. She denies any coughing, vomiting, nausea, or diarrhea. Patient's daughter cannot remember the date of her last tetanus vaccination. Patient lives with her daughter.     Past Medical History  Diagnosis Date  . Thyroid disease   . Diabetes mellitus without complication ([place])   . Parkinson disease ([place])   . Dementia   . Tuberculosis    Past Surgical History  Procedure Laterality Date  . Thyroidectomy    . Abdominal hysterectomy    . Cholecystectomy     No family history on file. Social History  Substance Use Topics  . Smoking status: Former Research scientist (life sciences)  . Smokeless tobacco: None  . Alcohol Use: No   OB History    No data available     Review of Systems  Respiratory: Negative for cough.   Gastrointestinal: Negative for  nausea, vomiting and diarrhea.  Skin: Positive for wound.  Psychiatric/Behavioral: Negative for confusion.  All other systems reviewed and are negative.   Allergies  Sulfa antibiotics  Home Medications   Prior to Admission medications   Medication Sig Start Date End Date Taking? Authorizing Provider  aspirin EC 81 MG tablet Take 81 mg by mouth daily.   Yes Historical Provider, MD  carbidopa-levodopa (SINEMET IR) 25-100 MG tablet Take 1 tablet by mouth 3 (three) times daily. 05/29/15  Yes Historical Provider, MD  cyanocobalamin 500 MCG tablet Take 500 mcg by mouth daily.   Yes Historical Provider, MD  levothyroxine (SYNTHROID, LEVOTHROID) 75 MCG tablet Take 1 tablet by mouth daily. 06/15/15  Yes Historical Provider, MD  meclizine (ANTIVERT) 25 MG tablet Take 1 tablet by mouth 4 (four) times daily as needed for dizziness.  06/19/15  Yes Historical Provider, MD  QUEtiapine (SEROQUEL) 25 MG tablet Take 2 tablets by mouth at bedtime. 06/19/15  Yes Historical Provider, MD  rivastigmine (EXELON) 1.5 MG capsule Take 1 capsule by mouth daily. 06/19/15  Yes Historical Provider, MD  rosuvastatin (CRESTOR) 20 MG tablet Take 1 tablet by mouth daily. 06/19/15  Yes Historical Provider, MD  TRADJENTA 5 MG TABS tablet Take 1 tablet by mouth daily. 06/15/15  Yes Historical Provider, MD  cephALEXin (KEFLEX) 500 MG capsule Take 1 capsule (500 mg total) by mouth 4 (four) times daily. 06/19/15   Aaron Edelman  Sabra Heck, MD   BP 136/88 mmHg  Pulse 80  Temp(Src) 98 F (36.7 C) (Oral)  Resp 16  Ht '5\' 2"'$  (1.575 m)  Wt 103 lb (46.72 kg)  BMI 18.83 kg/m2  SpO2 100% Physical Exam  Constitutional: She appears well-developed and well-nourished. No distress.  HENT:  Head: Normocephalic and atraumatic.  Mouth/Throat: Oropharynx is clear and moist. No oropharyngeal exudate.  Eyes: Conjunctivae and EOM are normal. Pupils are equal, round, and reactive to light. Right eye exhibits no discharge. Left eye exhibits no discharge. No  scleral icterus.  Neck: Normal range of motion. Neck supple. No JVD present. No thyromegaly present.  Cardiovascular: Normal rate, regular rhythm, normal heart sounds and intact distal pulses.  Exam reveals no gallop and no friction rub.   No murmur heard. Pulmonary/Chest: Effort normal and breath sounds normal. No respiratory distress. She has no wheezes. She has no rales.  Abdominal: Soft. Bowel sounds are normal. She exhibits no distension and no mass. There is no tenderness.  Musculoskeletal: Normal range of motion. She exhibits no edema or tenderness.  Left elbow with skin tear, senile purpura.  No pain with ROM of the left elbow.  No TTP over back. (She states, "It hurts, but not right now.") Joints are supple.  Compartments are soft diffusely.   Lymphadenopathy:    She has no cervical adenopathy.  Neurological: She is alert. Coordination normal.  Skin: Skin is warm and dry. No rash noted. No erythema.  Psychiatric: She has a normal mood and affect. Her behavior is normal.  Nursing note and vitals reviewed.   ED Course  Procedures (including critical care time)  DIAGNOSTIC STUDIES: Oxygen Saturation is 100% on RA, normal by my interpretation.    COORDINATION OF CARE: 10:10 AM - Discussed treatment plan with pt's daughter at bedside. Pt's daughter verbalized understanding and agreed to plan.   Labs Review Labs Reviewed  URINALYSIS, ROUTINE W REFLEX MICROSCOPIC (NOT AT [facility]) - Abnormal; Notable for the following:    APPearance HAZY (*)    Hgb urine dipstick SMALL (*)    Protein, ur 30 (*)    Leukocytes, UA MODERATE (*)    All other components within normal limits  URINE MICROSCOPIC-ADD ON - Abnormal; Notable for the following:    Bacteria, UA MANY (*)    All other components within normal limits  URINE CULTURE    Imaging Review Dg Lumbar Spine Complete  06/19/2015  CLINICAL DATA:  Status post fall last night with a low back injury and pain. Initial encounter. EXAM:  LUMBAR SPINE - COMPLETE 4+ VIEW COMPARISON:  None. FINDINGS: The patient appears to have transitional anatomy at the lumbosacral junction with 6 lumbar type vertebral bodies identified. There is a compression fracture of L1 which is age indeterminate but appears acute or subacute. Vertebral body height is otherwise maintained. Loss of disc space height is most notable at L5-6 and L6-S1. There is facet degenerative disease at these levels. Paraspinous structures demonstrate multiple surgical clips in the pelvis a left hip replacement. Large stool burden through the colon is noted. IMPRESSION: Transitional lumbosacral anatomy. The patient appears to have 6 lumbar type vertebral bodies. L1 compression fracture cannot be definitively characterized but appears acute or subacute. Lower lumbar spondylosis. Large colonic stool burden. Electronically Signed   By: Inge Rise M.D.   On: 06/19/2015 10:43   I have personally reviewed and evaluated these images and lab results as part of my medical decision-making.  MDM   Final  diagnoses:  Lumbar compression fracture, closed, initial encounter ([place])  UTI (lower urinary tract infection)    I personally performed the services described in this documentation, which was scribed in my presence. The recorded information has been reviewed and is accurate.    The pt has normal neuro exam, she has normal VS but has UTI and compressio of L1 vertebrae - pt and family informed of tx plan and findings and are in agreement.  Meds given in ED:  Medications  acetaminophen (TYLENOL) tablet 1,000 mg (1,000 mg Oral Given 06/19/15 1049)  cefTRIAXone (ROCEPHIN) 1 g in dextrose 5 % 50 mL IVPB (0 g Intravenous Stopped 06/19/15 1252)  0.9 %  sodium chloride infusion (75 mL/hr Intravenous New Bag/Given 06/19/15 1141)    New Prescriptions   CEPHALEXIN (KEFLEX) 500 MG CAPSULE    Take 1 capsule (500 mg total) by mouth 4 (four) times daily.      Noemi Chapel, MD 06/19/15  1259

## 2015-06-21 ENCOUNTER — Telehealth: Payer: Self-pay | Admitting: Oncology

## 2015-06-21 NOTE — Telephone Encounter (Signed)
Called Keyshla' daughter Jenny Reichmann.  Advised her of the good results on the CT Scan of chest from 06/14/15 per Dr. Sondra Come.  Cindy verbalized agreement and understanding.

## 2015-06-22 ENCOUNTER — Encounter: Payer: Self-pay | Admitting: Nurse Practitioner

## 2015-06-22 LAB — URINE CULTURE: Special Requests: NORMAL

## 2015-06-23 ENCOUNTER — Telehealth: Payer: Self-pay

## 2015-06-23 NOTE — Telephone Encounter (Signed)
Post ED Visit - Positive Culture Follow-up  Culture report reviewed by antimicrobial stewardship pharmacist:  '[]'$  Elenor Quinones, Pharm.D. '[]'$  Heide Guile, Pharm.D., BCPS '[]'$  Parks Neptune, Pharm.D. '[]'$  Alycia Rossetti, Pharm.D., BCPS '[]'$  Reynolds, Pharm.D., BCPS, AAHIVP '[]'$  Legrand Como, Pharm.D., BCPS, AAHIVP '[]'$  Cassie Stewart, Pharm.D. '[]'$  Stephens November, Pharm.D. Megan MIlls Pharm D Positive urine culture Treated with cephalexin, organism sensitive to the same and no further patient follow-up is required at this time.  Genia Del 06/23/2015, 11:06 AM

## 2015-06-26 ENCOUNTER — Inpatient Hospital Stay (HOSPITAL_COMMUNITY)
Admission: EM | Admit: 2015-06-26 | Discharge: 2015-06-28 | DRG: 389 | Disposition: A | Discharge status: Home Health | Payer: Medicare Other | Attending: Pulmonary Disease | Admitting: Pulmonary Disease

## 2015-06-26 ENCOUNTER — Emergency Department (HOSPITAL_COMMUNITY): Payer: Medicare Other

## 2015-06-26 ENCOUNTER — Encounter (HOSPITAL_COMMUNITY): Payer: Self-pay | Admitting: *Deleted

## 2015-06-26 DIAGNOSIS — K219 Gastro-esophageal reflux disease without esophagitis: Secondary | ICD-10-CM | POA: Diagnosis present

## 2015-06-26 DIAGNOSIS — E785 Hyperlipidemia, unspecified: Secondary | ICD-10-CM | POA: Diagnosis present

## 2015-06-26 DIAGNOSIS — Z8541 Personal history of malignant neoplasm of cervix uteri: Secondary | ICD-10-CM

## 2015-06-26 DIAGNOSIS — K5660 Unspecified intestinal obstruction: Secondary | ICD-10-CM | POA: Diagnosis present

## 2015-06-26 DIAGNOSIS — R52 Pain, unspecified: Secondary | ICD-10-CM

## 2015-06-26 DIAGNOSIS — I10 Essential (primary) hypertension: Secondary | ICD-10-CM | POA: Diagnosis present

## 2015-06-26 DIAGNOSIS — M4856XA Collapsed vertebra, not elsewhere classified, lumbar region, initial encounter for fracture: Secondary | ICD-10-CM | POA: Diagnosis present

## 2015-06-26 DIAGNOSIS — G20 Parkinson's disease: Secondary | ICD-10-CM | POA: Diagnosis present

## 2015-06-26 DIAGNOSIS — Z9049 Acquired absence of other specified parts of digestive tract: Secondary | ICD-10-CM

## 2015-06-26 DIAGNOSIS — Z87891 Personal history of nicotine dependence: Secondary | ICD-10-CM | POA: Diagnosis not present

## 2015-06-26 DIAGNOSIS — K56609 Unspecified intestinal obstruction, unspecified as to partial versus complete obstruction: Secondary | ICD-10-CM | POA: Diagnosis present

## 2015-06-26 DIAGNOSIS — R1110 Vomiting, unspecified: Secondary | ICD-10-CM | POA: Diagnosis not present

## 2015-06-26 DIAGNOSIS — Z90710 Acquired absence of both cervix and uterus: Secondary | ICD-10-CM

## 2015-06-26 DIAGNOSIS — Z85850 Personal history of malignant neoplasm of thyroid: Secondary | ICD-10-CM | POA: Diagnosis not present

## 2015-06-26 DIAGNOSIS — Z809 Family history of malignant neoplasm, unspecified: Secondary | ICD-10-CM

## 2015-06-26 DIAGNOSIS — E119 Type 2 diabetes mellitus without complications: Secondary | ICD-10-CM | POA: Diagnosis present

## 2015-06-26 DIAGNOSIS — M810 Age-related osteoporosis without current pathological fracture: Secondary | ICD-10-CM | POA: Diagnosis present

## 2015-06-26 DIAGNOSIS — Z8611 Personal history of tuberculosis: Secondary | ICD-10-CM | POA: Diagnosis not present

## 2015-06-26 DIAGNOSIS — Z96642 Presence of left artificial hip joint: Secondary | ICD-10-CM | POA: Diagnosis present

## 2015-06-26 DIAGNOSIS — E039 Hypothyroidism, unspecified: Secondary | ICD-10-CM | POA: Diagnosis present

## 2015-06-26 DIAGNOSIS — Z85118 Personal history of other malignant neoplasm of bronchus and lung: Secondary | ICD-10-CM | POA: Diagnosis not present

## 2015-06-26 DIAGNOSIS — K5669 Other intestinal obstruction: Secondary | ICD-10-CM | POA: Diagnosis not present

## 2015-06-26 DIAGNOSIS — IMO0002 Reserved for concepts with insufficient information to code with codable children: Secondary | ICD-10-CM

## 2015-06-26 DIAGNOSIS — Z923 Personal history of irradiation: Secondary | ICD-10-CM

## 2015-06-26 DIAGNOSIS — Z66 Do not resuscitate: Secondary | ICD-10-CM | POA: Diagnosis present

## 2015-06-26 DIAGNOSIS — F0390 Unspecified dementia without behavioral disturbance: Secondary | ICD-10-CM | POA: Diagnosis present

## 2015-06-26 DIAGNOSIS — T148 Other injury of unspecified body region: Secondary | ICD-10-CM | POA: Diagnosis not present

## 2015-06-26 LAB — URINALYSIS, ROUTINE W REFLEX MICROSCOPIC
BILIRUBIN URINE: NEGATIVE
GLUCOSE, UA: NEGATIVE mg/dL
Leukocytes, UA: NEGATIVE
Nitrite: NEGATIVE
PH: 5.5 (ref 5.0–8.0)
PROTEIN: 30 mg/dL — AB
Specific Gravity, Urine: 1.025 (ref 1.005–1.030)

## 2015-06-26 LAB — COMPREHENSIVE METABOLIC PANEL
ALBUMIN: 3.9 g/dL (ref 3.5–5.0)
AST: 17 U/L (ref 15–41)
Alkaline Phosphatase: 75 U/L (ref 38–126)
Anion gap: 13 (ref 5–15)
BILIRUBIN TOTAL: 0.6 mg/dL (ref 0.3–1.2)
BUN: 57 mg/dL — AB (ref 6–20)
CHLORIDE: 96 mmol/L — AB (ref 101–111)
CO2: 24 mmol/L (ref 22–32)
CREATININE: 1.56 mg/dL — AB (ref 0.44–1.00)
Calcium: 9.8 mg/dL (ref 8.9–10.3)
GFR calc Af Amer: 35 mL/min — ABNORMAL LOW (ref >60)
GFR, EST NON AFRICAN AMERICAN: 30 mL/min — AB (ref >60)
GLUCOSE: 162 mg/dL — AB (ref 65–99)
POTASSIUM: 4.6 mmol/L (ref 3.5–5.1)
Sodium: 133 mmol/L — ABNORMAL LOW (ref 135–145)
Total Protein: 7.7 g/dL (ref 6.5–8.1)

## 2015-06-26 LAB — URINE MICROSCOPIC-ADD ON

## 2015-06-26 LAB — CBC
HEMATOCRIT: 35.4 % — AB (ref 36.0–46.0)
Hemoglobin: 12 g/dL (ref 12.0–15.0)
MCH: 26.9 pg (ref 26.0–34.0)
MCHC: 33.9 g/dL (ref 30.0–36.0)
MCV: 79.4 fL (ref 78.0–100.0)
PLATELETS: 217 10*3/uL (ref 150–400)
RBC: 4.46 MIL/uL (ref 3.87–5.11)
RDW: 17.7 % — AB (ref 11.5–15.5)
WBC: 8.5 10*3/uL (ref 4.0–10.5)

## 2015-06-26 LAB — LIPASE, BLOOD: LIPASE: 29 U/L (ref 11–51)

## 2015-06-26 MED ORDER — CARBIDOPA-LEVODOPA 25-100 MG PO TABS
1.0000 | ORAL_TABLET | Freq: Three times a day (TID) | ORAL | Status: DC
  Administered 2015-06-26 – 2015-06-27 (×4): 1 via ORAL
  Filled 2015-06-26 (×4): qty 1

## 2015-06-26 MED ORDER — SODIUM CHLORIDE 0.9% FLUSH
3.0000 mL | Freq: Two times a day (BID) | INTRAVENOUS | Status: DC
  Administered 2015-06-26: 3 mL via INTRAVENOUS

## 2015-06-26 MED ORDER — SODIUM CHLORIDE 0.9 % IV BOLUS (SEPSIS)
500.0000 mL | Freq: Once | INTRAVENOUS | Status: AC
  Administered 2015-06-26: 500 mL via INTRAVENOUS

## 2015-06-26 MED ORDER — QUETIAPINE FUMARATE 25 MG PO TABS
50.0000 mg | ORAL_TABLET | Freq: Every day | ORAL | Status: DC
  Administered 2015-06-26: 50 mg via ORAL
  Filled 2015-06-26: qty 2

## 2015-06-26 MED ORDER — ONDANSETRON HCL 4 MG PO TABS
4.0000 mg | ORAL_TABLET | Freq: Four times a day (QID) | ORAL | Status: DC | PRN
Start: 2015-06-26 — End: 2015-06-28

## 2015-06-26 MED ORDER — RIVASTIGMINE TARTRATE 1.5 MG PO CAPS
1.5000 mg | ORAL_CAPSULE | Freq: Every day | ORAL | Status: DC
  Administered 2015-06-26 – 2015-06-27 (×2): 1.5 mg via ORAL
  Filled 2015-06-26 (×4): qty 1

## 2015-06-26 MED ORDER — RIVASTIGMINE TARTRATE 1.5 MG PO CAPS
ORAL_CAPSULE | ORAL | Status: AC
  Filled 2015-06-26: qty 1

## 2015-06-26 MED ORDER — ONDANSETRON HCL 4 MG/2ML IJ SOLN
4.0000 mg | Freq: Once | INTRAMUSCULAR | Status: AC
  Administered 2015-06-26: 4 mg via INTRAVENOUS
  Filled 2015-06-26: qty 2

## 2015-06-26 MED ORDER — OXYCODONE HCL 5 MG PO TABS
5.0000 mg | ORAL_TABLET | ORAL | Status: DC | PRN
  Filled 2015-06-26: qty 1

## 2015-06-26 MED ORDER — ONDANSETRON HCL 4 MG/2ML IJ SOLN: 4.0000 mg | Freq: Four times a day (QID) | INTRAMUSCULAR | Status: DC | PRN

## 2015-06-26 MED ORDER — SODIUM CHLORIDE 0.9 % IV SOLN
INTRAVENOUS | Status: DC
  Administered 2015-06-26 – 2015-06-27 (×2): via INTRAVENOUS

## 2015-06-26 MED ORDER — INSULIN ASPART 100 UNIT/ML ~~LOC~~ SOLN: 0.0000 [IU] | SUBCUTANEOUS | Status: DC

## 2015-06-26 MED ORDER — HYDROMORPHONE HCL 1 MG/ML IJ SOLN: 0.5000 mg | INTRAMUSCULAR | Status: DC | PRN

## 2015-06-26 MED ORDER — ONDANSETRON HCL 4 MG/2ML IJ SOLN
4.0000 mg | Freq: Once | INTRAMUSCULAR | Status: AC | PRN
  Administered 2015-06-26: 4 mg via INTRAVENOUS
  Filled 2015-06-26: qty 2

## 2015-06-26 MED ORDER — ENOXAPARIN SODIUM 30 MG/0.3ML ~~LOC~~ SOLN
30.0000 mg | SUBCUTANEOUS | Status: DC
Start: 2015-06-26 — End: 2015-06-28
  Administered 2015-06-26: 30 mg via SUBCUTANEOUS
  Filled 2015-06-26: qty 0.3

## 2015-06-26 MED ORDER — ACETAMINOPHEN 650 MG RE SUPP: 650.0000 mg | Freq: Four times a day (QID) | RECTAL | Status: DC | PRN

## 2015-06-26 MED ORDER — ACETAMINOPHEN 325 MG PO TABS: 650.0000 mg | ORAL_TABLET | Freq: Four times a day (QID) | ORAL | Status: DC | PRN

## 2015-06-26 MED ORDER — KETOROLAC TROMETHAMINE 0.5 % OP SOLN
1.0000 [drp] | Freq: Two times a day (BID) | OPHTHALMIC | Status: DC
  Administered 2015-06-27 (×2): 1 [drp] via OPHTHALMIC
  Filled 2015-06-26: qty 3

## 2015-06-26 MED ORDER — LEVOTHYROXINE SODIUM 75 MCG PO TABS
75.0000 ug | ORAL_TABLET | Freq: Every day | ORAL | Status: DC
  Administered 2015-06-27 – 2015-06-28 (×2): 75 ug via ORAL
  Filled 2015-06-26 (×2): qty 1

## 2015-06-26 NOTE — ED Notes (Signed)
Pt brought in by [facility] c/o emesis and mid abdominal pain x several days. Pt reports feeling very weak. Pt was here at hospital recently dx with compression fx in her back. VSS- BP 127/79, HR 94, Resp 20, T 96.54F for EMS. CBG 220 for EMS.

## 2015-06-26 NOTE — H&P (Signed)
[facility] Admission History and Physical       Krista [name] OXB:[id number] DOB: 11-11-1931 DOA: 06/26/2015  Referring physician: EDP PCP: Alonza Bogus, MD  Specialists:   Chief Complaint:   HPI: Krista [name] is a 82 y.o. female with a history of Parkinson's Disease, HTN, DM2, Dementia, Lung Cancer, Recent Diagnosis of Compression Fracture at L-1 who presents to the ED with complaints of Nausea and Vomiting for the past 3 days.   She was evaluated in the ED and a CT scan of the ABD was performed and revealed an SBO.   Patient was referred for admission and General surgery DR mark Arnoldo Morale is to see in the AM.     Review of Systems: Unable to Obtain from the Patient   Past Medical History  Diagnosis Date  . Hyperlipidemia   . Osteoporosis   . Pneumonia 5/13  . GERD (gastroesophageal reflux disease)   . History of migraine headaches   . Vertigo   . History of tuberculosis 1965  . Thyroid disease 2008    thyroid cancer cured with surgery  . Hypertension   . Diabetes mellitus     controlled with medication  . Parkinson's disease ([place])   . CA - cancer     thyroid - 2008 surg  . Lung cancer ([place]) 09/04/2011    non-small cell ca in L upper lobe  . Cancer ([place])     cervical ca - 2005 - surg and radiation  . Hypothyroidism   . Thyroid disease   . Diabetes mellitus without complication ([place])   . Parkinson disease ([place])   . Dementia   . Tuberculosis      Past Surgical History  Procedure Laterality Date  . Thyroidectomy  2007  . Hip arthroplasty  2010    left  . Cholecystectomy  1994  . Cataract extraction    . Lung biopsy  09/04/2011  . Abdominal hysterectomy  2005    complete  . Flexible sigmoidoscopy  2008    SLF: 1.angulated sigmoid colon 15 cm from anal verge. thickened sigmoid colon seen on CT. sigmoid diverticulosis 2. normal retrofexed view of rectum  . Thyroidectomy    . Abdominal hysterectomy    . Cholecystectomy        Prior to  Admission medications   Medication Sig Start Date End Date Taking? Authorizing Provider  aspirin EC 81 MG tablet Take 81 mg by mouth daily.   Yes Historical Provider, MD  carbidopa-levodopa (SINEMET IR) 25-100 MG per tablet Take 1 tablet by mouth 3 (three) times daily.    Yes Historical Provider, MD  Cyanocobalamin (B-12) 1000 MCG TBCR Take 1 tablet by mouth daily.   Yes Historical Provider, MD  ketorolac (ACULAR) 0.5 % ophthalmic solution Place 1 drop into both eyes 2 (two) times daily.  03/06/15  Yes Historical Provider, MD  levothyroxine (SYNTHROID, LEVOTHROID) 75 MCG tablet TAKE 1 TABLET BY MOUTH DAILY BEFORE BREAKFAST. 06/15/15  Yes Cassandria Anger, MD  meclizine (ANTIVERT) 25 MG tablet Take 25 mg by mouth 3 (three) times daily as needed for dizziness.    Yes Historical Provider, MD  ondansetron (ZOFRAN ODT) 8 MG disintegrating tablet Take 0.5 tablets (4 mg total) by mouth every 8 (eight) hours as needed for nausea or vomiting. 03/07/15  Yes Pattricia Boss, MD  QUEtiapine (SEROQUEL) 25 MG tablet Take 2 tablets by mouth at bedtime. 06/19/15  Yes Historical Provider, MD  rivastigmine (EXELON) 1.5 MG capsule Take 1.5 mg  by mouth at bedtime.    Yes Historical Provider, MD  rosuvastatin (CRESTOR) 20 MG tablet Take 20 mg by mouth every evening.    Yes Historical Provider, MD  TRADJENTA 5 MG TABS tablet TAKE (1) TABLET BY MOUTH EACH MORNING. 06/15/15  Yes Cassandria Anger, MD  cephALEXin (KEFLEX) 500 MG capsule Take 1 capsule (500 mg total) by mouth 4 (four) times daily. Patient not taking: Reported on 06/26/2015 06/19/15   Noemi Chapel, MD     Allergies  Allergen Reactions  . Amoxicillin Nausea Only  . Statins Nausea And Vomiting  . Sulfa Antibiotics Itching  . Sulfonamide Derivatives Nausea And Vomiting    Social History:  reports that she quit smoking about 33 years ago. Her smoking use included Cigarettes. She has a 5 pack-year smoking history. She does not have any smokeless tobacco  history on file. She reports that she does not drink alcohol or use illicit drugs.    Family History  Problem Relation Age of Onset  . Lung disease    . Cancer Sister     breast  . Cancer Sister     brain  . Cancer Sister     ovarian        Physical Exam:  GEN:  Pleasant Elderly Thin  82 y.o. Caucasian female examined and in no acute distress; cooperative with exam Filed Vitals:   06/26/15 2000 06/26/15 2013 06/26/15 2030 06/26/15 2113  BP: 118/85 118/85 118/57 124/62  Pulse: 91 89 85 90  Temp:  98.1 F (36.7 C)  98.2 F (36.8 C)  TempSrc:  Oral  Oral  Resp:  18  20  Height:      Weight:      SpO2: 87% 97% 98% 96%   Blood pressure 124/62, pulse 90, temperature 98.2 F (36.8 C), temperature source Oral, resp. rate 20, height '5\' 4"'$  (1.626 m), weight 47.174 kg (104 lb), SpO2 96 %. PSYCH: She is alert and oriented x1; does not appear anxious does not appear depressed; affect is normal HEENT: Normocephalic and Atraumatic, Mucous membranes pink; PERRLA; EOM intact; Fundi:  Benign;  No scleral icterus, Nares: Patent, Oropharynx: Clear, Fair Dentition,    Neck:  FROM, No Cervical Lymphadenopathy nor Thyromegaly or Carotid Bruit; No JVD; Breasts:: Not examined CHEST WALL: No tenderness CHEST: Normal respiration, clear to auscultation bilaterally HEART: Regular rate and rhythm; no murmurs rubs or gallops BACK: No kyphosis or scoliosis; No CVA tenderness ABDOMEN: Positive Bowel Sounds, Soft Non-Tender, No Rebound or Guarding; No Masses, No Organomegaly. Rectal Exam: Not done EXTREMITIES: No Cyanosis, Clubbing, or Edema; No Ulcerations. Genitalia: not examined PULSES: 2+ and symmetric SKIN: Normal hydration no rash or ulceration CNS:  Alert and Oriented x 4, No Focal Deficits Vascular: pulses palpable throughout    Labs on Admission:  Basic Metabolic Panel:  Recent Labs Lab 06/26/15 1630  NA 133*  K 4.6  CL 96*  CO2 24  GLUCOSE 162*  BUN 57*  CREATININE 1.56*    CALCIUM 9.8   Liver Function Tests:  Recent Labs Lab 06/26/15 1630  AST 17  ALT <5*  ALKPHOS 75  BILITOT 0.6  PROT 7.7  ALBUMIN 3.9    Recent Labs Lab 06/26/15 1630  LIPASE 29   No results for input(s): AMMONIA in the last 168 hours. CBC:  Recent Labs Lab 06/26/15 1630  WBC 8.5  HGB 12.0  HCT 35.4*  MCV 79.4  PLT 217   Cardiac Enzymes: No results for input(s): CKTOTAL,  CKMB, CKMBINDEX, TROPONINI in the last 168 hours.  BNP (last 3 results) No results for input(s): BNP in the last 8760 hours.  ProBNP (last 3 results) No results for input(s): PROBNP in the last 8760 hours.  CBG: No results for input(s): GLUCAP in the last 168 hours.  Radiological Exams on Admission: Ct Abdomen Pelvis Wo Contrast  06/26/2015  ADDENDUM REPORT: 06/26/2015 20:17 ADDENDUM: Patient had lumbar spine radiographs 06/19/2015 under a different medical record number. These have been reviewed. The fracture at T12 (counted from above and based on prior studies) is the same fracture described on the recent radiographs. This fracture has mildly progressed over this 1 week interval. This was discussed with Dr. Roderic Palau. Electronically Signed   By: Richardean Sale M.D.   On: 06/26/2015 20:17  06/26/2015  CLINICAL DATA:  Mid abdominal pain with vomiting for several days. History of compression fracture and thyroid cancer. EXAM: CT ABDOMEN AND PELVIS WITHOUT CONTRAST TECHNIQUE: Multidetector CT imaging of the abdomen and pelvis was performed following the standard protocol without IV contrast. COMPARISON:  CT 06/23/2014.  Chest CT 06/14/2015. FINDINGS: Lower chest: The visualized left lung base appears stable with radiation changes and bronchiectasis. Subpleural nodule in the right lower lobe measures 14 x 8 mm on the current examination, similar to recent prior studies, although enlarged from PET-CT of 2013. There is a small hiatal hernia. Hepatobiliary: The liver appears unremarkable as imaged in the  noncontrast state. No biliary dilatation status post cholecystectomy. Pancreas: Stable ill-defined low-density in the uncinate process (image 28). No evidence of pancreatic ductal dilatation or surrounding inflammation. Spleen: Normal in size without focal abnormality. There is a small amount of perisplenic fluid which appears new. Adrenals/Urinary Tract: The adrenal glands appear unchanged. A 6 cm mid right renal cyst is unchanged. There is a punctate nonobstructing calculus in the lower pole the right kidney which is stable. No evidence of ureteral or bladder calculus. No hydronephrosis. The bladder is partly obscured by artifact from the left hip arthroplasty, although demonstrates no significant findings. Stomach/Bowel: Interval development of multiple dilated proximal and mid small bowel loops, measuring up to 5.4 cm in diameter. Gradual transition point appears to be in the right lower quadrant. The terminal ileum is decompressed. There is stool throughout the colon. There is a small amount of mesenteric and interloop fluid, but no focal fluid collection. Vascular/Lymphatic: There are no enlarged abdominal or pelvic lymph nodes. There are extensive postsurgical changes in the pelvis from previous pelvic lymphadenectomy. Moderate aortoiliac atherosclerosis again noted. No acute vascular findings seen on noncontrast imaging. Reproductive: Hysterectomy.  No evidence of adnexal mass. Other: No evidence of abdominal wall mass or hernia. Progressive left hemidiaphragm elevation noted. Musculoskeletal: Since chest CT of 2 weeks ago, the patient has developed a burst fracture at T12 resulting in 70% loss vertebral body height and 8 mm of osseous retropulsion. There is mass effect on the thecal sac. Inferior endplate compression deformity at L5 appears unchanged. Previous left hip arthroplasty. IMPRESSION: 1. Interval development of distal small bowel obstruction with multiple dilated loops of small bowel. Specific  etiology not delineated, although transition appears to be in the right lower quadrant, probably from adhesions. 2. Development of T12 burst fracture compared with chest CT of 2 weeks ago. There is associated osseous retropulsion and mass effect on the thecal sac. 3. Mild mesenteric edema and ascites without focal extraluminal fluid collection. 4. Right lower lobe subpleural nodule is stable from recent studies, although has enlarged from 2013  and 2014. Continued attention on follow-up recommended. Electronically Signed: By: Richardean Sale M.D. On: 06/26/2015 18:47     EKG: Independently reviewed.    Assessment/Plan:   82 y.o. female with  Principal Problem:    SBO (small bowel obstruction) (HCC)   NPO except sip with Meds   IVFs   Anti-Emetics PRN   General Surgery to see in AM    Active Problems:    Diabetes mellitus without complication (HCC)   SSI coverage PRN      Essential hypertension   Monitor BPs   PRN IV Hydralazine     Compression fracture   Pain Control PRn IV Dilaudid      Parkinson's disease (HCC)   On Sinemet     Dementia   Chornic    DVT Prophylaxis   SCDs    Code Status:   DO NOT RESUSCITATE (DNR)      Family Communication:   Daughter at Bedside     Disposition Plan:    Inpatient Status        Time spent:  [facility] Pager [phone]   If [place] Please Contact the Day Rounding Team MD for [facility]  If 7PM-7AM, Please Contact Night-Floor Coverage  [email or web address] Password [facility] 06/26/2015, 9:56 PM     ADDENDUM:   Patient was seen and examined on 06/26/2015

## 2015-06-26 NOTE — ED Provider Notes (Signed)
CSN: [id number]     Arrival date & time 06/26/15  42 History   First MD Initiated Contact with Patient 06/26/15 1721     Chief Complaint  Patient presents with  . Emesis  . Abdominal Pain     (Consider location/radiation/quality/duration/timing/severity/associated sxs/prior Treatment) Patient is a 82 y.o. female presenting with vomiting and abdominal pain. The history is provided by the patient (The patient has been vomiting since Saturday. She also complains of some lower abdominal discomfort. Patient has a compression fracture of T30 that over week old).  Emesis Severity:  Moderate Timing:  Constant Quality:  Undigested food Able to tolerate:  Liquids Progression:  Unchanged Chronicity:  New Recent urination:  Normal Relieved by:  Nothing Associated symptoms: abdominal pain   Associated symptoms: no diarrhea and no headaches   Abdominal Pain Associated symptoms: vomiting   Associated symptoms: no chest pain, no cough, no diarrhea, no fatigue and no hematuria     Past Medical History  Diagnosis Date  . Hyperlipidemia   . Osteoporosis   . Pneumonia 5/13  . GERD (gastroesophageal reflux disease)   . History of migraine headaches   . Vertigo   . History of tuberculosis 1965  . Thyroid disease 2008    thyroid cancer cured with surgery  . Hypertension   . Diabetes mellitus     controlled with medication  . Parkinson's disease ([place])   . CA - cancer     thyroid - 2008 surg  . Lung cancer ([place]) 09/04/2011    non-small cell ca in L upper lobe  . Cancer ([place])     cervical ca - 2005 - surg and radiation  . Hypothyroidism   . Thyroid disease   . Diabetes mellitus without complication ([place])   . Parkinson disease ([place])   . Dementia   . Tuberculosis    Past Surgical History  Procedure Laterality Date  . Thyroidectomy  2007  . Hip arthroplasty  2010    left  . Cholecystectomy  1994  . Cataract extraction    . Lung biopsy  09/04/2011  . Abdominal hysterectomy  2005   complete  . Flexible sigmoidoscopy  2008    SLF: 1.angulated sigmoid colon 15 cm from anal verge. thickened sigmoid colon seen on CT. sigmoid diverticulosis 2. normal retrofexed view of rectum  . Thyroidectomy    . Abdominal hysterectomy    . Cholecystectomy     Family History  Problem Relation Age of Onset  . Lung disease    . Cancer Sister     breast  . Cancer Sister     brain  . Cancer Sister     ovarian    Social History  Substance Use Topics  . Smoking status: Former Smoker -- 0.25 packs/day for 20 years    Types: Cigarettes    Quit date: 03/04/1982  . Smokeless tobacco: None  . Alcohol Use: No   OB History    Gravida Para Term Preterm AB TAB SAB Ectopic Multiple Living   0 0 0 0 0 0 0 0       Review of Systems  Constitutional: Negative for appetite change and fatigue.  HENT: Negative for congestion, ear discharge and sinus pressure.   Eyes: Negative for discharge.  Respiratory: Negative for cough.   Cardiovascular: Negative for chest pain.  Gastrointestinal: Positive for vomiting and abdominal pain. Negative for diarrhea.  Genitourinary: Negative for frequency and hematuria.  Musculoskeletal: Negative for back pain.  Skin: Negative for  rash.  Neurological: Negative for seizures and headaches.  Psychiatric/Behavioral: Negative for hallucinations.      Allergies  Amoxicillin; Statins; Sulfa antibiotics; and Sulfonamide derivatives  Home Medications   Prior to Admission medications   Medication Sig Start Date End Date Taking? Authorizing Provider  aspirin EC 81 MG tablet Take 81 mg by mouth daily.   Yes Historical Provider, MD  carbidopa-levodopa (SINEMET IR) 25-100 MG per tablet Take 1 tablet by mouth 3 (three) times daily.    Yes Historical Provider, MD  Cyanocobalamin (B-12) 1000 MCG TBCR Take 1 tablet by mouth daily.   Yes Historical Provider, MD  ketorolac (ACULAR) 0.5 % ophthalmic solution Place 1 drop into both eyes 2 (two) times daily.  03/06/15  Yes  Historical Provider, MD  levothyroxine (SYNTHROID, LEVOTHROID) 75 MCG tablet TAKE 1 TABLET BY MOUTH DAILY BEFORE BREAKFAST. 06/15/15  Yes Cassandria Anger, MD  meclizine (ANTIVERT) 25 MG tablet Take 25 mg by mouth 3 (three) times daily as needed for dizziness.    Yes Historical Provider, MD  ondansetron (ZOFRAN ODT) 8 MG disintegrating tablet Take 0.5 tablets (4 mg total) by mouth every 8 (eight) hours as needed for nausea or vomiting. 03/07/15  Yes Pattricia Boss, MD  QUEtiapine (SEROQUEL) 25 MG tablet Take 2 tablets by mouth at bedtime. 06/19/15  Yes Historical Provider, MD  rivastigmine (EXELON) 1.5 MG capsule Take 1.5 mg by mouth at bedtime.    Yes Historical Provider, MD  rosuvastatin (CRESTOR) 20 MG tablet Take 20 mg by mouth every evening.    Yes Historical Provider, MD  TRADJENTA 5 MG TABS tablet TAKE (1) TABLET BY MOUTH EACH MORNING. 06/15/15  Yes Cassandria Anger, MD  cephALEXin (KEFLEX) 500 MG capsule Take 1 capsule (500 mg total) by mouth 4 (four) times daily. Patient not taking: Reported on 06/26/2015 06/19/15   Noemi Chapel, MD   BP 118/57 mmHg  Pulse 85  Temp(Src) 98.1 F (36.7 C) (Oral)  Resp 18  Ht '5\' 4"'$  (1.626 m)  Wt 104 lb (47.174 kg)  BMI 17.84 kg/m2  SpO2 98% Physical Exam  Constitutional: She is oriented to person, place, and time. She appears well-developed.  HENT:  Head: Normocephalic.  Eyes: Conjunctivae and EOM are normal. No scleral icterus.  Neck: Neck supple. No thyromegaly present.  Cardiovascular: Normal rate and regular rhythm.  Exam reveals no gallop and no friction rub.   No murmur heard. Pulmonary/Chest: No stridor. She has no wheezes. She has no rales. She exhibits no tenderness.  Abdominal: She exhibits no distension. There is tenderness. There is no rebound.  Musculoskeletal: Normal range of motion. She exhibits no edema.  Lymphadenopathy:    She has no cervical adenopathy.  Neurological: She is oriented to person, place, and time. She exhibits  normal muscle tone. Coordination normal.  Skin: No rash noted. No erythema.  Psychiatric: She has a normal mood and affect. Her behavior is normal.    ED Course  Procedures (including critical care time) Labs Review Labs Reviewed  COMPREHENSIVE METABOLIC PANEL - Abnormal; Notable for the following:    Sodium 133 (*)    Chloride 96 (*)    Glucose, Bld 162 (*)    BUN 57 (*)    Creatinine, Ser 1.56 (*)    ALT <5 (*)    GFR calc non Af Amer 30 (*)    GFR calc Af Amer 35 (*)    All other components within normal limits  CBC - Abnormal; Notable for the  following:    HCT 35.4 (*)    RDW 17.7 (*)    All other components within normal limits  URINALYSIS, ROUTINE W REFLEX MICROSCOPIC (NOT AT [facility]) - Abnormal; Notable for the following:    Hgb urine dipstick MODERATE (*)    Ketones, ur TRACE (*)    Protein, ur 30 (*)    All other components within normal limits  URINE MICROSCOPIC-ADD ON - Abnormal; Notable for the following:    Squamous Epithelial / LPF 6-30 (*)    Bacteria, UA FEW (*)    Casts GRANULAR CAST (*)    All other components within normal limits  LIPASE, BLOOD    Imaging Review Ct Abdomen Pelvis Wo Contrast  06/26/2015  ADDENDUM REPORT: 06/26/2015 20:17 ADDENDUM: Patient had lumbar spine radiographs 06/19/2015 under a different medical record number. These have been reviewed. The fracture at T12 (counted from above and based on prior studies) is the same fracture described on the recent radiographs. This fracture has mildly progressed over this 1 week interval. This was discussed with Dr. Roderic Palau. Electronically Signed   By: Richardean Sale M.D.   On: 06/26/2015 20:17  06/26/2015  CLINICAL DATA:  Mid abdominal pain with vomiting for several days. History of compression fracture and thyroid cancer. EXAM: CT ABDOMEN AND PELVIS WITHOUT CONTRAST TECHNIQUE: Multidetector CT imaging of the abdomen and pelvis was performed following the standard protocol without IV contrast.  COMPARISON:  CT 06/23/2014.  Chest CT 06/14/2015. FINDINGS: Lower chest: The visualized left lung base appears stable with radiation changes and bronchiectasis. Subpleural nodule in the right lower lobe measures 14 x 8 mm on the current examination, similar to recent prior studies, although enlarged from PET-CT of 2013. There is a small hiatal hernia. Hepatobiliary: The liver appears unremarkable as imaged in the noncontrast state. No biliary dilatation status post cholecystectomy. Pancreas: Stable ill-defined low-density in the uncinate process (image 28). No evidence of pancreatic ductal dilatation or surrounding inflammation. Spleen: Normal in size without focal abnormality. There is a small amount of perisplenic fluid which appears new. Adrenals/Urinary Tract: The adrenal glands appear unchanged. A 6 cm mid right renal cyst is unchanged. There is a punctate nonobstructing calculus in the lower pole the right kidney which is stable. No evidence of ureteral or bladder calculus. No hydronephrosis. The bladder is partly obscured by artifact from the left hip arthroplasty, although demonstrates no significant findings. Stomach/Bowel: Interval development of multiple dilated proximal and mid small bowel loops, measuring up to 5.4 cm in diameter. Gradual transition point appears to be in the right lower quadrant. The terminal ileum is decompressed. There is stool throughout the colon. There is a small amount of mesenteric and interloop fluid, but no focal fluid collection. Vascular/Lymphatic: There are no enlarged abdominal or pelvic lymph nodes. There are extensive postsurgical changes in the pelvis from previous pelvic lymphadenectomy. Moderate aortoiliac atherosclerosis again noted. No acute vascular findings seen on noncontrast imaging. Reproductive: Hysterectomy.  No evidence of adnexal mass. Other: No evidence of abdominal wall mass or hernia. Progressive left hemidiaphragm elevation noted. Musculoskeletal: Since  chest CT of 2 weeks ago, the patient has developed a burst fracture at T12 resulting in 70% loss vertebral body height and 8 mm of osseous retropulsion. There is mass effect on the thecal sac. Inferior endplate compression deformity at L5 appears unchanged. Previous left hip arthroplasty. IMPRESSION: 1. Interval development of distal small bowel obstruction with multiple dilated loops of small bowel. Specific etiology not delineated, although transition appears  to be in the right lower quadrant, probably from adhesions. 2. Development of T12 burst fracture compared with chest CT of 2 weeks ago. There is associated osseous retropulsion and mass effect on the thecal sac. 3. Mild mesenteric edema and ascites without focal extraluminal fluid collection. 4. Right lower lobe subpleural nodule is stable from recent studies, although has enlarged from 2013 and 2014. Continued attention on follow-up recommended. Electronically Signed: By: Richardean Sale M.D. On: 06/26/2015 18:47   I have personally reviewed and evaluated these images and lab results as part of my medical decision-making.   EKG Interpretation None      MDM   Final diagnoses:  Small bowel obstruction ([place])    Patient with a small bowel obstruction and T12 compression fracture. I have spoke with the general surgeon Dr. Aviva Signs and he will consult on patient tomorrow. She will be admitted for small bowel obstruction    Milton Ferguson, MD 06/26/15 2113

## 2015-06-26 NOTE — ED Notes (Signed)
Report to Alicia, RN.

## 2015-06-27 ENCOUNTER — Ambulatory Visit: Payer: Medicare Other | Admitting: Orthopaedic Surgery

## 2015-06-27 DIAGNOSIS — E119 Type 2 diabetes mellitus without complications: Secondary | ICD-10-CM

## 2015-06-27 DIAGNOSIS — G20 Parkinson's disease: Secondary | ICD-10-CM

## 2015-06-27 DIAGNOSIS — F0390 Unspecified dementia without behavioral disturbance: Secondary | ICD-10-CM

## 2015-06-27 DIAGNOSIS — T148 Other injury of unspecified body region: Secondary | ICD-10-CM

## 2015-06-27 DIAGNOSIS — K5669 Other intestinal obstruction: Secondary | ICD-10-CM

## 2015-06-27 LAB — GLUCOSE, CAPILLARY
GLUCOSE-CAPILLARY: 116 mg/dL — AB (ref 65–99)
Glucose-Capillary: 120 mg/dL — ABNORMAL HIGH (ref 65–99)
Glucose-Capillary: 109 mg/dL — ABNORMAL HIGH (ref 65–99)
Glucose-Capillary: 115 mg/dL — ABNORMAL HIGH (ref 65–99)
Glucose-Capillary: 120 mg/dL — ABNORMAL HIGH (ref 65–99)

## 2015-06-27 LAB — BASIC METABOLIC PANEL
Anion gap: 11 (ref 5–15)
BUN: 56 mg/dL — AB (ref 6–20)
CHLORIDE: 105 mmol/L (ref 101–111)
CO2: 21 mmol/L — AB (ref 22–32)
CREATININE: 1.33 mg/dL — AB (ref 0.44–1.00)
Calcium: 8.2 mg/dL — ABNORMAL LOW (ref 8.9–10.3)
GFR calc Af Amer: 42 mL/min — ABNORMAL LOW (ref >60)
GFR calc non Af Amer: 36 mL/min — ABNORMAL LOW (ref >60)
GLUCOSE: 100 mg/dL — AB (ref 65–99)
POTASSIUM: 3.3 mmol/L — AB (ref 3.5–5.1)
SODIUM: 137 mmol/L (ref 135–145)

## 2015-06-27 LAB — CBC
HEMATOCRIT: 27.5 % — AB (ref 36.0–46.0)
Hemoglobin: 9.3 g/dL — ABNORMAL LOW (ref 12.0–15.0)
MCH: 27.1 pg (ref 26.0–34.0)
MCHC: 33.8 g/dL (ref 30.0–36.0)
MCV: 80.2 fL (ref 78.0–100.0)
PLATELETS: 202 10*3/uL (ref 150–400)
RBC: 3.43 MIL/uL — ABNORMAL LOW (ref 3.87–5.11)
RDW: 17.8 % — ABNORMAL HIGH (ref 11.5–15.5)
WBC: 5.1 10*3/uL (ref 4.0–10.5)

## 2015-06-27 MED ORDER — QUETIAPINE FUMARATE 25 MG PO TABS
50.0000 mg | ORAL_TABLET | Freq: Every day | ORAL | Status: DC
  Administered 2015-06-27: 50 mg via ORAL
  Filled 2015-06-27: qty 2

## 2015-06-27 MED ORDER — POTASSIUM CHLORIDE CRYS ER 20 MEQ PO TBCR
40.0000 meq | EXTENDED_RELEASE_TABLET | Freq: Once | ORAL | Status: AC
  Administered 2015-06-27: 40 meq via ORAL
  Filled 2015-06-27: qty 2

## 2015-06-27 MED ORDER — ENSURE ENLIVE PO LIQD
237.0000 mL | Freq: Two times a day (BID) | ORAL | Status: DC
  Administered 2015-06-27: 237 mL via ORAL

## 2015-06-27 MED ORDER — POTASSIUM CHLORIDE 2 MEQ/ML IV SOLN
INTRAVENOUS | Status: DC
  Administered 2015-06-27: 20:00:00 via INTRAVENOUS
  Filled 2015-06-27 (×4): qty 1000

## 2015-06-27 MED ORDER — ROSUVASTATIN CALCIUM 20 MG PO TABS: 20.0000 mg | ORAL_TABLET | Freq: Every evening | ORAL | Status: DC

## 2015-06-27 MED ORDER — ASPIRIN EC 81 MG PO TBEC: 81.0000 mg | DELAYED_RELEASE_TABLET | Freq: Every day | ORAL | Status: DC

## 2015-06-27 NOTE — Consult Note (Signed)
Reason for Consult: Small bowel obstruction Referring Physician: Dr. Gladys [name] is an 82 y.o. [name].  HPI: Krista [name] is an 82 year old Krista [name] who presented emergency room with worsening nausea and vomiting. States earlier. Krista [name] did have some mild abdominal cramping, which has resolved. Krista [name] was brought into the hospital for further evaluation and treatment after CT scan of the abdomen showed a partial small bowel obstruction with significant constipation. Since Krista [name] has been admitted to the hospital, Krista [name] has had multiple well-formed bowel movements. Krista [name] abdominal pain has eased. Krista [name] denies any nausea or vomiting. Krista [name] appetite is decreased.  Past Medical History  Diagnosis Date  . Hyperlipidemia   . Osteoporosis   . Pneumonia 5/13  . GERD (gastroesophageal reflux disease)   . History of migraine headaches   . Vertigo   . History of tuberculosis 1965  . Thyroid disease 2008    thyroid cancer cured with surgery  . Hypertension   . Diabetes mellitus     controlled with medication  . Parkinson's disease ([place])   . CA - cancer     thyroid - 2008 surg  . Lung cancer ([place]) 09/04/2011    non-small cell ca in L upper lobe  . Cancer ([place])     cervical ca - 2005 - surg and radiation  . Hypothyroidism   . Thyroid disease   . Diabetes mellitus without complication ([place])   . Parkinson disease ([place])   . Dementia   . Tuberculosis     Past Surgical History  Procedure Laterality Date  . Thyroidectomy  2007  . Hip arthroplasty  2010    left  . Cholecystectomy  1994  . Cataract extraction    . Lung biopsy  09/04/2011  . Abdominal hysterectomy  2005    complete  . Flexible sigmoidoscopy  2008    SLF: 1.angulated sigmoid colon 15 cm from anal verge. thickened sigmoid colon seen on CT. sigmoid diverticulosis 2. normal retrofexed view of rectum  . Thyroidectomy    . Abdominal hysterectomy    . Cholecystectomy      Family History  Problem Relation Age of Onset  . Lung disease     . Cancer Sister     breast  . Cancer Sister     brain  . Cancer Sister     ovarian     Social History:  reports that Krista [name] quit smoking about 33 years ago. Krista [name] smoking use included Cigarettes. Krista [name] has a 5 pack-year smoking history. Krista [name] does not have any smokeless tobacco history on file. Krista [name] reports that Krista [name] does not drink alcohol or use illicit drugs.  Allergies:  Allergies  Allergen Reactions  . Amoxicillin Nausea Only  . Statins Nausea And Vomiting  . Sulfa Antibiotics Itching  . Sulfonamide Derivatives Nausea And Vomiting    Medications:  Prior to Admission:  Prescriptions prior to admission  Medication Sig Dispense Refill Last Dose  . aspirin EC 81 MG tablet Take 81 mg by mouth daily.   06/23/2015 at Unknown time  . carbidopa-levodopa (SINEMET IR) 25-100 MG per tablet Take 1 tablet by mouth 3 (three) times daily.    06/26/2015 at 1100a  . Cyanocobalamin (B-12) 1000 MCG TBCR Take 1 tablet by mouth daily.   Past Week at Unknown time  . ketorolac (ACULAR) 0.5 % ophthalmic solution Place 1 drop into both eyes 2 (two) times daily.    Past Week at Unknown time  . levothyroxine (SYNTHROID, LEVOTHROID) 75 MCG tablet TAKE  1 TABLET BY MOUTH DAILY BEFORE BREAKFAST. 30 tablet 0 06/26/2015 at Unknown time  . meclizine (ANTIVERT) 25 MG tablet Take 25 mg by mouth 3 (three) times daily as needed for dizziness.    Past Week at Unknown time  . ondansetron (ZOFRAN ODT) 8 MG disintegrating tablet Take 0.5 tablets (4 mg total) by mouth every 8 (eight) hours as needed for nausea or vomiting. 20 tablet 0 06/26/2015 at morning  . QUEtiapine (SEROQUEL) 25 MG tablet Take 2 tablets by mouth at bedtime.   06/25/2015 at Unknown time  . rivastigmine (EXELON) 1.5 MG capsule Take 1.5 mg by mouth at bedtime.    06/25/2015 at Unknown time  . rosuvastatin (CRESTOR) 20 MG tablet Take 20 mg by mouth every evening.    06/25/2015 at Unknown time  . TRADJENTA 5 MG TABS tablet TAKE (1) TABLET BY MOUTH EACH MORNING. 30 tablet  2 06/26/2015 at Unknown time  . cephALEXin (KEFLEX) 500 MG capsule Take 1 capsule (500 mg total) by mouth 4 (four) times daily. (Krista [name] not taking: Reported on 06/26/2015) 40 capsule 0    Scheduled: . carbidopa-levodopa  1 tablet Oral TID  . enoxaparin (LOVENOX) injection  30 mg Subcutaneous Q24H  . feeding supplement (ENSURE ENLIVE)  237 mL Oral BID BM  . insulin aspart  0-9 Units Subcutaneous Q4H  . ketorolac  1 drop Both Eyes BID  . levothyroxine  75 mcg Oral QAC breakfast  . QUEtiapine  50 mg Oral QHS  . rivastigmine  1.5 mg Oral QHS  . sodium chloride flush  3 mL Intravenous Q12H    Results for orders placed or performed during the hospital encounter of 06/26/15 (from the past 48 hour(s))  Lipase, blood     Status: None   Collection Time: 06/26/15  4:30 PM  Result Value Ref Range   Lipase 29 11 - 51 U/L  Comprehensive metabolic panel     Status: Abnormal   Collection Time: 06/26/15  4:30 PM  Result Value Ref Range   Sodium 133 (L) 135 - 145 mmol/L   Potassium 4.6 3.5 - 5.1 mmol/L   Chloride 96 (L) 101 - 111 mmol/L   CO2 24 22 - 32 mmol/L   Glucose, Bld 162 (H) 65 - 99 mg/dL   BUN 57 (H) 6 - 20 mg/dL   Creatinine, Ser 1.56 (H) 0.Krista - 1.00 mg/dL   Calcium 9.8 8.9 - 10.3 mg/dL   Total Protein 7.7 6.5 - 8.1 g/dL   Albumin 3.9 3.5 - 5.0 g/dL   AST 17 15 - 41 U/L   ALT <5 (L) 14 - 54 U/L   Alkaline Phosphatase 75 38 - 126 U/L   Total Bilirubin 0.6 0.3 - 1.2 mg/dL   GFR calc non Af Amer 30 (L) >60 mL/min   GFR calc Af Amer 35 (L) >60 mL/min    Comment: (NOTE) The eGFR has been calculated using the CKD EPI equation. This calculation has not been validated in all clinical situations. eGFR's persistently <60 mL/min signify possible Chronic Kidney Disease.    Anion gap 13 5 - 15  CBC     Status: Abnormal   Collection Time: 06/26/15  4:30 PM  Result Value Ref Range   WBC 8.5 4.0 - 10.5 K/uL   RBC 4.46 3.87 - 5.11 MIL/uL   Hemoglobin 12.0 12.0 - 15.0 g/dL   HCT 35.4 (L)  36.0 - 46.0 %   MCV 79.4 78.0 - 100.0 fL   MCH 26.9  26.0 - 34.0 pg   MCHC 33.9 30.0 - 36.0 g/dL   RDW 17.7 (H) 11.5 - 15.5 %   Platelets 217 150 - 400 K/uL  Urinalysis, Routine w reflex microscopic (not at [facility])     Status: Abnormal   Collection Time: 06/26/15  8:20 PM  Result Value Ref Range   Color, Urine YELLOW YELLOW   APPearance CLEAR CLEAR   Specific Gravity, Urine 1.025 1.005 - 1.030   pH 5.5 5.0 - 8.0   Glucose, UA NEGATIVE NEGATIVE mg/dL   Hgb urine dipstick MODERATE (A) NEGATIVE   Bilirubin Urine NEGATIVE NEGATIVE   Ketones, ur TRACE (A) NEGATIVE mg/dL   Protein, ur 30 (A) NEGATIVE mg/dL   Nitrite NEGATIVE NEGATIVE   Leukocytes, UA NEGATIVE NEGATIVE  Urine microscopic-add on     Status: Abnormal   Collection Time: 06/26/15  8:20 PM  Result Value Ref Range   Squamous Epithelial / LPF 6-30 (A) NONE SEEN   WBC, UA 6-30 0 - 5 WBC/hpf   RBC / HPF 6-30 0 - 5 RBC/hpf   Bacteria, UA FEW (A) NONE SEEN   Casts GRANULAR CAST (A) NEGATIVE  Glucose, capillary     Status: Abnormal   Collection Time: 06/26/15 11:Krista PM  Result Value Ref Range   Glucose-Capillary 120 (H) 65 - 99 mg/dL   Comment 1 Notify RN    Comment 2 Document in Chart   Glucose, capillary     Status: Abnormal   Collection Time: 06/27/15  3:58 AM  Result Value Ref Range   Glucose-Capillary 116 (H) 65 - 99 mg/dL  Basic metabolic panel     Status: Abnormal   Collection Time: 06/27/15  4:35 AM  Result Value Ref Range   Sodium 137 135 - 145 mmol/L   Potassium 3.3 (L) 3.5 - 5.1 mmol/L    Comment: DELTA CHECK NOTED   Chloride 105 101 - 111 mmol/L   CO2 21 (L) 22 - 32 mmol/L   Glucose, Bld 100 (H) 65 - 99 mg/dL   BUN 56 (H) 6 - 20 mg/dL   Creatinine, Ser 1.33 (H) 0.Krista - 1.00 mg/dL   Calcium 8.2 (L) 8.9 - 10.3 mg/dL   GFR calc non Af Amer 36 (L) >60 mL/min   GFR calc Af Amer 42 (L) >60 mL/min    Comment: (NOTE) The eGFR has been calculated using the CKD EPI equation. This calculation has not been validated in  all clinical situations. eGFR's persistently <60 mL/min signify possible Chronic Kidney Disease.    Anion gap 11 5 - 15  CBC     Status: Abnormal   Collection Time: 06/27/15  4:35 AM  Result Value Ref Range   WBC 5.1 4.0 - 10.5 K/uL   RBC 3.43 (L) 3.87 - 5.11 MIL/uL   Hemoglobin 9.3 (L) 12.0 - 15.0 g/dL    Comment: DELTA CHECK NOTED RESULT REPEATED AND VERIFIED    HCT 27.5 (L) 36.0 - 46.0 %   MCV 80.2 78.0 - 100.0 fL   MCH 27.1 26.0 - 34.0 pg   MCHC 33.8 30.0 - 36.0 g/dL   RDW 17.8 (H) 11.5 - 15.5 %   Platelets 202 150 - 400 K/uL    Ct Abdomen Pelvis Wo Contrast  06/26/2015  ADDENDUM REPORT: 06/26/2015 20:17 ADDENDUM: Krista [name] had lumbar spine radiographs 06/19/2015 under a different medical record number. These have been reviewed. The fracture at T12 (counted from above and based on prior studies) is the same fracture described  on the recent radiographs. This fracture has mildly progressed over this 1 week interval. This was discussed with Dr. Roderic Palau. Electronically Signed   By: Richardean Sale M.D.   On: 06/26/2015 20:17  06/26/2015  CLINICAL DATA:  Mid abdominal pain with vomiting for several days. History of compression fracture and thyroid cancer. EXAM: CT ABDOMEN AND PELVIS WITHOUT CONTRAST TECHNIQUE: Multidetector CT imaging of the abdomen and pelvis was performed following the standard protocol without IV contrast. COMPARISON:  CT 06/23/2014.  Chest CT 06/14/2015. FINDINGS: Lower chest: The visualized left lung base appears stable with radiation changes and bronchiectasis. Subpleural nodule in the right lower lobe measures 14 x 8 mm on the current examination, similar to recent prior studies, although enlarged from PET-CT of 2013. There is a small hiatal hernia. Hepatobiliary: The liver appears unremarkable as imaged in the noncontrast state. No biliary dilatation status post cholecystectomy. Pancreas: Stable ill-defined low-density in the uncinate process (image 28). No evidence of  pancreatic ductal dilatation or surrounding inflammation. Spleen: Normal in size without focal abnormality. There is a small amount of perisplenic fluid which appears new. Adrenals/Urinary Tract: The adrenal glands appear unchanged. A 6 cm mid right renal cyst is unchanged. There is a punctate nonobstructing calculus in the lower pole the right kidney which is stable. No evidence of ureteral or bladder calculus. No hydronephrosis. The bladder is partly obscured by artifact from the left hip arthroplasty, although demonstrates no significant findings. Stomach/Bowel: Interval development of multiple dilated proximal and mid small bowel loops, measuring up to 5.4 cm in diameter. Gradual transition point appears to be in the right lower quadrant. The terminal ileum is decompressed. There is stool throughout the colon. There is a small amount of mesenteric and interloop fluid, but no focal fluid collection. Vascular/Lymphatic: There are no enlarged abdominal or pelvic lymph nodes. There are extensive postsurgical changes in the pelvis from previous pelvic lymphadenectomy. Moderate aortoiliac atherosclerosis again noted. No acute vascular findings seen on noncontrast imaging. Reproductive: Hysterectomy.  No evidence of adnexal mass. Other: No evidence of abdominal wall mass or hernia. Progressive left hemidiaphragm elevation noted. Musculoskeletal: Since chest CT of 2 weeks ago, the Krista [name] has developed a burst fracture at T12 resulting in 70% loss vertebral body height and 8 mm of osseous retropulsion. There is mass effect on the thecal sac. Inferior endplate compression deformity at L5 appears unchanged. Previous left hip arthroplasty. IMPRESSION: 1. Interval development of distal small bowel obstruction with multiple dilated loops of small bowel. Specific etiology not delineated, although transition appears to be in the right lower quadrant, probably from adhesions. 2. Development of T12 burst fracture compared with  chest CT of 2 weeks ago. There is associated osseous retropulsion and mass effect on the thecal sac. 3. Mild mesenteric edema and ascites without focal extraluminal fluid collection. 4. Right lower lobe subpleural nodule is stable from recent studies, although has enlarged from 2013 and 2014. Continued attention on follow-up recommended. Electronically Signed: By: Richardean Sale M.D. On: 06/26/2015 18:47    ROS:  A comprehensive review of systems was negative except for: Constitutional: positive for fatigue  Blood pressure 106/60, pulse 89, temperature 98.3 F (36.8 C), temperature source Oral, resp. rate 20, height '5\' 4"'$  (1.626 m), weight 47.12 kg (103 lb 14.1 oz), SpO2 96 %. Physical Exam: Krista [name] in no acute distress. Abdomen is soft, nontender, nondistended. No hepatosplenomegaly, masses, or hernias identified. Rectal examination was deferred at this time.  Assessment/Plan: Impression: Small bowel obstruction, resolving.  Suspect Krista [name] had mild bowel dysfunction due to current treatment for UTI as well as a recent T12 compression fracture. Krista [name] bowel obstruction has resolved. No need for surgical intervention at this time. Will advance diet. Will follow peripherally with you.  Simpson Paulos A 06/27/2015, 8:Krista AM

## 2015-06-27 NOTE — Progress Notes (Addendum)
PROGRESS NOTE    Krista [name]  DEY:[id number] DOB: Aug 03, 1931 DOA: 06/26/2015 PCP: Alonza Bogus, MD   Outpatient Specialists:  Gastroenterology, Danie Binder, MD    Brief Narrative:  42 yof with a hx of Parkinson's, HTN, DM2, dementia, lung CA, presented with complaints of nausea and vomiting for the past 3 days. While in the ED, a CT abd showed a SBO. She was referred for admission.    Assessment & Plan:   Principal Problem:   SBO (small bowel obstruction) (HCC) Active Problems:   Diabetes mellitus without complication ([place])   Essential hypertension   Parkinson's disease ([place])   Dementia   Compression fracture  1. SBO. Clinically appears to be impvoing. Continue IVFs. Continue anti-emetics PRN. Appreciate Gen Surgery assistance. Advance diet to soft foods in AM. If tolerates soft foods, can consider discharge home in AM. 2. DM type 2 without complication. Tradjenta currently on hold. Continue SSI 3. Hypothyroidism. Continue synthroid 4. Hyperlipidemia, continue statin 5. Compression fx. Continue pain management and physical therapy 6. Parkinson's disease. Continue Sinemet.  7. Dementia. Stable. Continue exelon   DVT prophylaxis: SCD's Code Status: DNR Family Communication: Daughter present at bedside. Disposition Plan: Discharge tomorrow if improved   Consultants:   General Surgery  Procedures:   none  Antimicrobials:   none    Subjective: Having multiple bowel movements. No vomiting. Feels weak. Tolerating full liquids  Objective: Filed Vitals:   06/26/15 2030 06/26/15 2113 06/26/15 2353 06/27/15 0406  BP: 118/57 124/62 116/64 106/60  Pulse: 85 90 88 89  Temp:  98.2 F (36.8 C) 98.2 F (36.8 C) 98.3 F (36.8 C)  TempSrc:  Oral Oral Oral  Resp:  '20 20 20  '$ Height:   '5\' 4"'$  (1.626 m)   Weight:   47.12 kg (103 lb 14.1 oz)   SpO2: 98% 96% 95% 96%    Intake/Output Summary (Last 24 hours) at 06/27/15 0701 Last data filed at 06/27/15 0000  Gross per 24 hour  Intake     40 ml  Output      0 ml  Net     40 ml   Filed Weights   06/26/15 1542 06/26/15 2353  Weight: 47.174 kg (104 lb) 47.12 kg (103 lb 14.1 oz)   Examination:  General exam: Appears calm and comfortable  Respiratory system: Clear to auscultation. Respiratory effort normal. Cardiovascular system: S1 & S2 heard, RRR. No JVD, murmurs, rubs, gallops or clicks. No pedal edema. Gastrointestinal system: Abdomen is nondistended, soft and nontender. No organomegaly or masses felt. Normal bowel sounds heard. Central nervous system: Alert and oriented. No focal neurological deficits. Extremities: Symmetric 5 x 5 power. Skin: No rashes, lesions or ulcers Psychiatry: mild confusion    Data Reviewed: I have personally reviewed following labs and imaging studies  CBC:  Recent Labs Lab 06/26/15 1630 06/27/15 0435  WBC 8.5 5.1  HGB 12.0 9.3*  HCT 35.4* 27.5*  MCV 79.4 80.2  PLT 217 314   Basic Metabolic Panel:  Recent Labs Lab 06/26/15 1630 06/27/15 0435  NA 133* 137  K 4.6 3.3*  CL 96* 105  CO2 24 21*  GLUCOSE 162* 100*  BUN 57* 56*  CREATININE 1.56* 1.33*  CALCIUM 9.8 8.2*   GFR: Estimated Creatinine Clearance: 24.2 mL/min (by C-G formula based on Cr of 1.33). Liver Function Tests:  Recent Labs Lab 06/26/15 1630  AST 17  ALT <5*  ALKPHOS 75  BILITOT 0.6  PROT 7.7  ALBUMIN 3.9  Recent Labs Lab 06/26/15 1630  LIPASE 29   No results for input(s): AMMONIA in the last 168 hours. Coagulation Profile: No results for input(s): INR, PROTIME in the last 168 hours. Cardiac Enzymes: No results for input(s): CKTOTAL, CKMB, CKMBINDEX, TROPONINI in the last 168 hours. BNP (last 3 results) No results for input(s): PROBNP in the last 8760 hours. HbA1C: No results for input(s): HGBA1C in the last 72 hours. CBG:  Recent Labs Lab 06/26/15 2344 06/27/15 0358  GLUCAP 120* 116*   Lipid Profile: No results for input(s): CHOL, HDL, LDLCALC,  TRIG, CHOLHDL, LDLDIRECT in the last 72 hours. Thyroid Function Tests: No results for input(s): TSH, T4TOTAL, FREET4, T3FREE, THYROIDAB in the last 72 hours. Anemia Panel: No results for input(s): VITAMINB12, FOLATE, FERRITIN, TIBC, IRON, RETICCTPCT in the last 72 hours. Urine analysis:    Component Value Date/Time   COLORURINE YELLOW 06/26/2015 2020   APPEARANCEUR CLEAR 06/26/2015 2020   LABSPEC 1.025 06/26/2015 2020   PHURINE 5.5 06/26/2015 2020   GLUCOSEU NEGATIVE 06/26/2015 2020   HGBUR MODERATE* 06/26/2015 2020   HGBUR negative 04/05/2008 0000   BILIRUBINUR NEGATIVE 06/26/2015 2020   KETONESUR TRACE* 06/26/2015 2020   PROTEINUR 30* 06/26/2015 2020   UROBILINOGEN 0.2 11/27/2014 1517   NITRITE NEGATIVE 06/26/2015 2020   LEUKOCYTESUR NEGATIVE 06/26/2015 2020   Sepsis Labs: '@LABRCNTIP'$ (procalcitonin:4,lacticidven:4)  ) Recent Results (from the past 240 hour(s))  Urine culture     Status: Abnormal   Collection Time: 06/19/15 10:54 AM  Result Value Ref Range Status   Specimen Description URINE, CLEAN CATCH  Final   Special Requests Normal  Final   Culture (A)  Final    >=100,000 COLONIES/mL AEROCOCCUS URINAE Standardized susceptibility testing for this organism is not available. Performed at [facility]    Report Status 06/22/2015 FINAL  Final         Radiology Studies: Ct Abdomen Pelvis Wo Contrast  06/26/2015  ADDENDUM REPORT: 06/26/2015 20:17 ADDENDUM: Patient had lumbar spine radiographs 06/19/2015 under a different medical record number. These have been reviewed. The fracture at T12 (counted from above and based on prior studies) is the same fracture described on the recent radiographs. This fracture has mildly progressed over this 1 week interval. This was discussed with Dr. Roderic Palau. Electronically Signed   By: Richardean Sale M.D.   On: 06/26/2015 20:17  06/26/2015  CLINICAL DATA:  Mid abdominal pain with vomiting for several days. History of compression  fracture and thyroid cancer. EXAM: CT ABDOMEN AND PELVIS WITHOUT CONTRAST TECHNIQUE: Multidetector CT imaging of the abdomen and pelvis was performed following the standard protocol without IV contrast. COMPARISON:  CT 06/23/2014.  Chest CT 06/14/2015. FINDINGS: Lower chest: The visualized left lung base appears stable with radiation changes and bronchiectasis. Subpleural nodule in the right lower lobe measures 14 x 8 mm on the current examination, similar to recent prior studies, although enlarged from PET-CT of 2013. There is a small hiatal hernia. Hepatobiliary: The liver appears unremarkable as imaged in the noncontrast state. No biliary dilatation status post cholecystectomy. Pancreas: Stable ill-defined low-density in the uncinate process (image 28). No evidence of pancreatic ductal dilatation or surrounding inflammation. Spleen: Normal in size without focal abnormality. There is a small amount of perisplenic fluid which appears new. Adrenals/Urinary Tract: The adrenal glands appear unchanged. A 6 cm mid right renal cyst is unchanged. There is a punctate nonobstructing calculus in the lower pole the right kidney which is stable. No evidence of ureteral or bladder calculus.  No hydronephrosis. The bladder is partly obscured by artifact from the left hip arthroplasty, although demonstrates no significant findings. Stomach/Bowel: Interval development of multiple dilated proximal and mid small bowel loops, measuring up to 5.4 cm in diameter. Gradual transition point appears to be in the right lower quadrant. The terminal ileum is decompressed. There is stool throughout the colon. There is a small amount of mesenteric and interloop fluid, but no focal fluid collection. Vascular/Lymphatic: There are no enlarged abdominal or pelvic lymph nodes. There are extensive postsurgical changes in the pelvis from previous pelvic lymphadenectomy. Moderate aortoiliac atherosclerosis again noted. No acute vascular findings seen on  noncontrast imaging. Reproductive: Hysterectomy.  No evidence of adnexal mass. Other: No evidence of abdominal wall mass or hernia. Progressive left hemidiaphragm elevation noted. Musculoskeletal: Since chest CT of 2 weeks ago, the patient has developed a burst fracture at T12 resulting in 70% loss vertebral body height and 8 mm of osseous retropulsion. There is mass effect on the thecal sac. Inferior endplate compression deformity at L5 appears unchanged. Previous left hip arthroplasty. IMPRESSION: 1. Interval development of distal small bowel obstruction with multiple dilated loops of small bowel. Specific etiology not delineated, although transition appears to be in the right lower quadrant, probably from adhesions. 2. Development of T12 burst fracture compared with chest CT of 2 weeks ago. There is associated osseous retropulsion and mass effect on the thecal sac. 3. Mild mesenteric edema and ascites without focal extraluminal fluid collection. 4. Right lower lobe subpleural nodule is stable from recent studies, although has enlarged from 2013 and 2014. Continued attention on follow-up recommended. Electronically Signed: By: Richardean Sale M.D. On: 06/26/2015 18:47        Scheduled Meds: . carbidopa-levodopa  1 tablet Oral TID  . enoxaparin (LOVENOX) injection  30 mg Subcutaneous Q24H  . insulin aspart  0-9 Units Subcutaneous Q4H  . ketorolac  1 drop Both Eyes BID  . levothyroxine  75 mcg Oral QAC breakfast  . QUEtiapine  50 mg Oral QHS  . rivastigmine  1.5 mg Oral QHS  . sodium chloride flush  3 mL Intravenous Q12H   Continuous Infusions: . sodium chloride 75 mL/hr at 06/26/15 2337     LOS: 1 day    Time spent: 25 minutes    Kathie Dike, MD [facility] Pager [phone]  If 7PM-7AM, please contact night-coverage [email or web address] Password [email or web address] 06/27/2015, 7:01 AM

## 2015-06-27 NOTE — Evaluation (Signed)
Physical Therapy Evaluation Patient Details Name: BRIAN KOCOUREK MRN: [id number] DOB: [birth year] Today's Date: 06/27/2015   History of Present Illness  Charlisa Cham is an 82yo white female who comes to [facility] on 4/24 p 3d N/V, CT revealing SBO. Pt was recently here after sustaining a fall at home with subsequent LUE skin tear and L1 compression fracture. Pt has lived in a house with  24/7 supervision, performing mostly household distance AMB c RWm, and some intermittent limited community distances c CGA. PMH: PD (x3Y), HTN, DM@, dementia, Lung CA, and hip fracture.   Clinical Impression  At evaluation, pt is received semirecumbent in bed upon entry, daughter and grandson present, who assist with history due to some memory deficits. The pt is awake and agreeable to participate. No acute distress noted at this time. The pt is alert and oriented to self, pleasant, and following simple commands consistently. Family reports one fall in the last 3 months, however demonstrates multiple LOB throughout session requiring physical assistance for correction and avoidance of fall: adherence to RW use is good per family. Pt grip strength is weak and symmetrical; global strength as screened during functional mobility assessment presents with moderate to heavy impairment, the pt now requiring physical assistance for transfers, prolonged sitting, and gait, whereas the patient performs these mod indep at baseline. Pt falls risk is high as evidenced by slow gait speed, limited forward reach (<10"), and high falls anxiety during transfers and mobility.   Patient presenting with impairment of strength, range of motion, balance, cognition, and activity tolerance, limiting ability to perform ADL and mobility tasks at  baseline level of function. Patient will benefit from skilled intervention to address the above impairments and limitations, in order to restore to prior level of function, improve patient safety upon discharge,  and to decrease falls risk.       Follow Up Recommendations Home health PT    Equipment Recommendations  3in1 (PT)    Recommendations for Other Services       Precautions / Restrictions Precautions Precautions: None Precaution Comments: high falls risk from PT perspective.  Required Braces or Orthoses:  (daughter has been helping with a LSO from [place] per MD recommendation, which she says has helped with Lumbar fracture. ) Restrictions Weight Bearing Restrictions: No      Mobility  Bed Mobility Overal bed mobility: Modified Independent                Transfers Overall transfer level: Needs assistance Equipment used: 1 person hand held assist Transfers: Sit to/from Stand Sit to Stand: Min assist         General transfer comment: req max effort, comes to standing for 5 seconds than collapses posteriorly toward bed, req max physical assit to recovwer LOB.   Ambulation/Gait Ambulation/Gait assistance: Min assist Ambulation Distance (Feet): 40 Feet Assistive device: 1 person hand held assist     Gait velocity interpretation: <1.8 ft/sec, indicative of risk for recurrent falls General Gait Details: short chppy steps (not quite festinating), patially bent knees, poor trunk excursion, mild forward flexion. Poor tolerance, turns back fearful of legs giving out.   Stairs            Wheelchair Mobility    Modified Rankin (Stroke Patients Only)       Balance Overall balance assessment: Needs assistance  High Level Balance Comments: Multiple substantial LOB during gait trial with poor recovery requires max assist to recover. High falls risk. Uses furniture and railing in hallway for support.              Pertinent Vitals/Pain Pain Assessment: No/denies pain    Home Living Family/patient expects to be discharged to:: Private residence Living Arrangements: Children (since January 2017. ) Available Help at  Discharge: Family Type of Home: House Home Access: Stairs to enter Entrance Stairs-Rails: Right Entrance Stairs-Number of Steps: 2 Home Layout: One level Home Equipment: [place] - 2 wheels;Wheelchair - manual      Prior Function Level of Independence: Needs assistance   Gait / Transfers Assistance Needed: Household distances c RW; WC for community distances.   ADL's / Homemaking Assistance Needed: modified indep c dressing and bathing, but limited BUE function (unable to reach head easily)         Hand Dominance        Extremity/Trunk Assessment   Upper Extremity Assessment: Generalized weakness           Lower Extremity Assessment: Generalized weakness      Cervical / Trunk Assessment: Kyphotic  Communication   Communication: HOH  Cognition Arousal/Alertness: Awake/alert Behavior During Therapy: WFL for tasks assessed/performed;Anxious Overall Cognitive Status: History of cognitive impairments - at baseline (baseline intermittent hallucinations/psychosis per daughter. )       Memory: Decreased recall of precautions;Decreased short-term memory              General Comments      Exercises        Assessment/Plan    PT Assessment Patient needs continued PT services  PT Diagnosis Difficulty walking;Abnormality of gait;Generalized weakness;Altered mental status   PT Problem List Decreased strength;Decreased cognition;Decreased range of motion;Decreased activity tolerance;Decreased balance;Decreased mobility;Decreased safety awareness  PT Treatment Interventions Gait training;DME instruction;Balance training;Functional mobility training;Therapeutic activities;Therapeutic exercise;Patient/family education   PT Goals (Current goals can be found in the Care Plan section) Acute Rehab PT Goals Patient Stated Goal: Regain mobility and reduce falls risk.  PT Goal Formulation: With family Time For Goal Achievement: 07/11/15 Potential to Achieve Goals: Fair     Frequency Min 3X/week   Barriers to discharge        Co-evaluation               End of Session Equipment Utilized During Treatment: Gait belt Activity Tolerance: Patient limited by fatigue;Patient limited by lethargy Patient left: in chair;with family/visitor present           Time: 2876-8115 PT Time Calculation (min) (ACUTE ONLY): 19 min   Charges:   PT Evaluation $PT Eval Moderate Complexity: 1 Procedure PT Treatments $Therapeutic Activity: 8-22 mins   PT G Codes:       2:31 PM, 07/16/2015 Etta Grandchild, PT, DPT PRN Physical Therapist - [facility] License # [id number] [phone] (mobile)

## 2015-06-27 NOTE — Care Management Note (Signed)
Case Management Note  Patient Details  Name: DORINE DUFFEY MRN: [id number] Date of Birth: 1931/08/12  Subjective/Objective:   Spoke with patient who is alert and oriented from home with daughter and Son in Sports coach. Stated that she has a walker and a wheelchair. hasn't been walking much in the last 2 weeks due to feeling sick and week. Stated that her daughter helps her quite a bit. Son in law is in the home  During the day and patient stated that she has someone with her 24 hours a day.  [facility].  Will ask for PT evaluation. Patient debilitated from illness.  Patient has had home health before but cannot remember the agency.Continue to follow.            Action/Plan: Home with home health.   Expected Discharge Date:                  Expected Discharge Plan:  [place]  In-House Referral:     Discharge planning Services  CM Consult  Post Acute Care Choice:    Choice offered to:  Patient  DME Arranged:    DME Agency:     HH Arranged:    [place] Agency:     Status of Service:  In process, will continue to follow  Medicare Important Message Given:    Date Medicare IM Given:    Medicare IM give by:    Date Additional Medicare IM Given:    Additional Medicare Important Message give by:     If discussed at [place] of Stay Meetings, dates discussed:    Additional Comments:  Alvie Heidelberg, RN 06/27/2015, 12:08 PM

## 2015-06-28 LAB — BASIC METABOLIC PANEL
Anion gap: 9 (ref 5–15)
BUN: 34 mg/dL — ABNORMAL HIGH (ref 6–20)
CALCIUM: 7.8 mg/dL — AB (ref 8.9–10.3)
CO2: 21 mmol/L — AB (ref 22–32)
CREATININE: 0.97 mg/dL (ref 0.44–1.00)
Chloride: 109 mmol/L (ref 101–111)
GFR calc non Af Amer: 53 mL/min — ABNORMAL LOW (ref >60)
Glucose, Bld: 96 mg/dL (ref 65–99)
Potassium: 4.1 mmol/L (ref 3.5–5.1)
SODIUM: 139 mmol/L (ref 135–145)

## 2015-06-28 LAB — HEMOGLOBIN A1C
HEMOGLOBIN A1C: 6.9 % — AB (ref 4.8–5.6)
Mean Plasma Glucose: 151 mg/dL

## 2015-06-28 LAB — GLUCOSE, CAPILLARY
GLUCOSE-CAPILLARY: 90 mg/dL (ref 65–99)
Glucose-Capillary: 104 mg/dL — ABNORMAL HIGH (ref 65–99)

## 2015-06-28 NOTE — Care Management Note (Signed)
Case Management Note  Patient Details  Name: Krista [name] MRN: [id number] Date of Birth: 1932-02-25  Expected Discharge Date:  06/28/15               Expected Discharge Plan:  [place]  In-House Referral:     Discharge planning Services  CM Consult  Post Acute Care Choice:  Durable Medical Equipment, Home Health Choice offered to:  Patient  DME Arranged:  Bedside commode DME Agency:  [facility]  HH Arranged:  RN, PT [facility] Agency:  [facility]  Status of Service:  Completed, signed off  Medicare Important Message Given:    Date Medicare IM Given:    Medicare IM give by:    Date Additional Medicare IM Given:    Additional Medicare Important Message give by:     If discussed at [place] of Stay Meetings, dates discussed:    Additional Comments: Pt discharging home today with [facility] services. Pt/daughter has chosen [facility] for [employer]. Abby, of [facility], made aware of referral and will obtain pt info from chart. Pt understands [place] agency will have 48 hours to initiate services. Pt ordered [facility] and pt would like DME from [place]. Pt info and order faxed to [facility] with instructions to deliver to pt's home. No further CM needs. Pt's daughter at bedside, understands and agree's with DC planning.   Sherald Barge, RN 06/28/2015, 9:48 AM

## 2015-06-28 NOTE — Progress Notes (Signed)
Pt's IV catheter removed and intact. Pt's IV site clean dry and intact. Discharge instructions including medications and follow up appointments were reviewed and discussed with patient's daughter. All questions were answered and no further questions at this time. Pt's daughter verbalized understanding of discharge instructions including medications. Pt in stable condition and in no acute distress at time of discharge. Pt escorted by nurse tech,

## 2015-06-28 NOTE — Care Management Important Message (Signed)
Important Message  Patient Details  Name: Krista [name] MRN: [id number] Date of Birth: 03/11/1931   Medicare Important Message Given:  N/A - LOS <3 / Initial given by admissions    Sherald Barge, RN 06/28/2015, 9:50 AM

## 2015-06-28 NOTE — Discharge Summary (Signed)
Physician Discharge Summary  Patient ID: VESTER TITSWORTH MRN: [id number] DOB/AGE: 80/22/1933 80 y.o. Primary Care Physician:HAWKINS,EDWARD L, MD Admit date: 06/26/2015 Discharge date: 06/28/2015    Discharge Diagnoses:   Principal Problem:   SBO (small bowel obstruction) ([place]) Active Problems:   Diabetes mellitus without complication ([place])   Essential hypertension   Parkinson's disease ([place])   Dementia   Compression fracture     Medication List    STOP taking these medications        cephALEXin 500 MG capsule  Commonly known as:  KEFLEX      TAKE these medications        aspirin EC 81 MG tablet  Take 81 mg by mouth daily.     B-12 1000 MCG Tbcr  Take 1 tablet by mouth daily.     carbidopa-levodopa 25-100 MG tablet  Commonly known as:  SINEMET IR  Take 1 tablet by mouth 3 (three) times daily.     ketorolac 0.5 % ophthalmic solution  Commonly known as:  ACULAR  Place 1 drop into both eyes 2 (two) times daily.     levothyroxine 75 MCG tablet  Commonly known as:  SYNTHROID, LEVOTHROID  TAKE 1 TABLET BY MOUTH DAILY BEFORE BREAKFAST.     meclizine 25 MG tablet  Commonly known as:  ANTIVERT  Take 25 mg by mouth 3 (three) times daily as needed for dizziness.     ondansetron 8 MG disintegrating tablet  Commonly known as:  ZOFRAN ODT  Take 0.5 tablets (4 mg total) by mouth every 8 (eight) hours as needed for nausea or vomiting.     QUEtiapine 25 MG tablet  Commonly known as:  SEROQUEL  Take 2 tablets by mouth at bedtime.     rivastigmine 1.5 MG capsule  Commonly known as:  EXELON  Take 1.5 mg by mouth at bedtime.     rosuvastatin 20 MG tablet  Commonly known as:  CRESTOR  Take 20 mg by mouth every evening.     TRADJENTA 5 MG Tabs tablet  Generic drug:  linagliptin  TAKE (1) TABLET BY MOUTH EACH MORNING.        Discharged Condition: improved    Consults: surgery  Significant Diagnostic Studies: Ct Abdomen Pelvis Wo Contrast  06/26/2015  ADDENDUM  REPORT: 06/26/2015 20:17 ADDENDUM: Patient had lumbar spine radiographs 06/19/2015 under a different medical record number. These have been reviewed. The fracture at T12 (counted from above and based on prior studies) is the same fracture described on the recent radiographs. This fracture has mildly progressed over this 1 week interval. This was discussed with Dr. Roderic Palau. Electronically Signed   By: Richardean Sale M.D.   On: 06/26/2015 20:17  06/26/2015  CLINICAL DATA:  Mid abdominal pain with vomiting for several days. History of compression fracture and thyroid cancer. EXAM: CT ABDOMEN AND PELVIS WITHOUT CONTRAST TECHNIQUE: Multidetector CT imaging of the abdomen and pelvis was performed following the standard protocol without IV contrast. COMPARISON:  CT 06/23/2014.  Chest CT 06/14/2015. FINDINGS: Lower chest: The visualized left lung base appears stable with radiation changes and bronchiectasis. Subpleural nodule in the right lower lobe measures 14 x 8 mm on the current examination, similar to recent prior studies, although enlarged from PET-CT of 2013. There is a small hiatal hernia. Hepatobiliary: The liver appears unremarkable as imaged in the noncontrast state. No biliary dilatation status post cholecystectomy. Pancreas: Stable ill-defined low-density in the uncinate process (image 28). No evidence of pancreatic ductal dilatation  or surrounding inflammation. Spleen: Normal in size without focal abnormality. There is a small amount of perisplenic fluid which appears new. Adrenals/Urinary Tract: The adrenal glands appear unchanged. A 6 cm mid right renal cyst is unchanged. There is a punctate nonobstructing calculus in the lower pole the right kidney which is stable. No evidence of ureteral or bladder calculus. No hydronephrosis. The bladder is partly obscured by artifact from the left hip arthroplasty, although demonstrates no significant findings. Stomach/Bowel: Interval development of multiple dilated  proximal and mid small bowel loops, measuring up to 5.4 cm in diameter. Gradual transition point appears to be in the right lower quadrant. The terminal ileum is decompressed. There is stool throughout the colon. There is a small amount of mesenteric and interloop fluid, but no focal fluid collection. Vascular/Lymphatic: There are no enlarged abdominal or pelvic lymph nodes. There are extensive postsurgical changes in the pelvis from previous pelvic lymphadenectomy. Moderate aortoiliac atherosclerosis again noted. No acute vascular findings seen on noncontrast imaging. Reproductive: Hysterectomy.  No evidence of adnexal mass. Other: No evidence of abdominal wall mass or hernia. Progressive left hemidiaphragm elevation noted. Musculoskeletal: Since chest CT of 2 weeks ago, the patient has developed a burst fracture at T12 resulting in 70% loss vertebral body height and 8 mm of osseous retropulsion. There is mass effect on the thecal sac. Inferior endplate compression deformity at L5 appears unchanged. Previous left hip arthroplasty. IMPRESSION: 1. Interval development of distal small bowel obstruction with multiple dilated loops of small bowel. Specific etiology not delineated, although transition appears to be in the right lower quadrant, probably from adhesions. 2. Development of T12 burst fracture compared with chest CT of 2 weeks ago. There is associated osseous retropulsion and mass effect on the thecal sac. 3. Mild mesenteric edema and ascites without focal extraluminal fluid collection. 4. Right lower lobe subpleural nodule is stable from recent studies, although has enlarged from 2013 and 2014. Continued attention on follow-up recommended. Electronically Signed: By: Richardean Sale M.D. On: 06/26/2015 18:47   Dg Lumbar Spine Complete  06/19/2015  CLINICAL DATA:  Status post fall last night with a low back injury and pain. Initial encounter. EXAM: LUMBAR SPINE - COMPLETE 4+ VIEW COMPARISON:  None. FINDINGS:  The patient appears to have transitional anatomy at the lumbosacral junction with 6 lumbar type vertebral bodies identified. There is a compression fracture of L1 which is age indeterminate but appears acute or subacute. Vertebral body height is otherwise maintained. Loss of disc space height is most notable at L5-6 and L6-S1. There is facet degenerative disease at these levels. Paraspinous structures demonstrate multiple surgical clips in the pelvis a left hip replacement. Large stool burden through the colon is noted. IMPRESSION: Transitional lumbosacral anatomy. The patient appears to have 6 lumbar type vertebral bodies. L1 compression fracture cannot be definitively characterized but appears acute or subacute. Lower lumbar spondylosis. Large colonic stool burden. Electronically Signed   By: Inge Rise M.D.   On: 06/19/2015 10:43   Ct Chest Wo Contrast  06/14/2015  CLINICAL DATA:  Lung cancer, chronic productive cough. EXAM: CT CHEST WITHOUT CONTRAST TECHNIQUE: Multidetector CT imaging of the chest was performed following the standard protocol without IV contrast. COMPARISON:  12/19/2014. FINDINGS: Mediastinum/Nodes: Thyroidectomy. Mediastinal lymph nodes are not enlarged by CT size criteria. Hilar regions are difficult to definitively evaluate without IV contrast. No axillary adenopathy. Atherosclerotic calcification of the arterial vasculature, including coronary arteries. Heart is enlarged. Small pericardial effusion. Lungs/Pleura: Biapical pleural parenchymal scarring. Scattered  calcified granulomas. Likely postinfectious peribronchovascular nodularity in the apical segment right upper lobe, stable. Subpleural nodule in the right lower lobe measures approximately 10 x 7 mm but there is respiratory motion, degrading image quality. Post treatment volume loss and bronchiectasis in the left perihilar region and left lower lobe, stable. No pleural fluid. Debris is seen dependently in the upper trachea.  Upper abdomen: Visualized portions of the liver and adrenal glands are unremarkable. Low-attenuation lesion off the interpolar right kidney measures at least 5.1 cm, incompletely imaged, similar. Visualized portions of the left kidney, spleen, pancreas, stomach and bowel are grossly unremarkable. Musculoskeletal: No worrisome lytic or sclerotic lesions. IMPRESSION: 1. Subpleural right lower lobe nodule is stable from recent prior examinations but is enlarged from 12/16/2012. Continued attention on followup exams is warranted. 2. Coronary artery calcification. Electronically Signed   By: Lorin Picket M.D.   On: 06/14/2015 12:44    Lab Results: Basic Metabolic Panel:  Recent Labs  06/27/15 0435 06/28/15 0425  NA 137 139  K 3.3* 4.1  CL 105 109  CO2 21* 21*  GLUCOSE 100* 96  BUN 56* 34*  CREATININE 1.33* 0.97  CALCIUM 8.2* 7.8*   Liver Function Tests:  Recent Labs  06/26/15 1630  AST 17  ALT <5*  ALKPHOS 75  BILITOT 0.6  PROT 7.7  ALBUMIN 3.9     CBC:  Recent Labs  06/26/15 1630 06/27/15 0435  WBC 8.5 5.1  HGB 12.0 9.3*  HCT 35.4* 27.5*  MCV 79.4 80.2  PLT 217 202    Recent Results (from the past 240 hour(s))  Urine culture     Status: Abnormal   Collection Time: 06/19/15 10:54 AM  Result Value Ref Range Status   Specimen Description URINE, CLEAN CATCH  Final   Special Requests Normal  Final   Culture (A)  Final    >=100,000 COLONIES/mL AEROCOCCUS URINAE Standardized susceptibility testing for this organism is not available. Performed at [facility]    Report Status 06/22/2015 FINAL  Final     Hospital Course:   This a an 82 years old female with history of multiple medical illnesses was admitted due to nausea and vomiting. Her CT of the abdomen showed partial bowel obstruction. Patient was medically treated. She was also evaluated by surgery. Her symptoms improved. She was able to move her bowel and tolerated her diet. She is being discharged home  in stable condition.  Discharge Exam: Blood pressure 118/67, pulse 82, temperature 98.4 F (36.9 C), temperature source Oral, resp. rate 17, height '5\' 4"'$  (1.626 m), weight 47.12 kg (103 lb 14.1 oz), SpO2 100 %.    Disposition:  Home.      Signed: Maurisio Ruddy   06/28/2015, 8:04 AM

## 2015-07-06 ENCOUNTER — Telehealth: Payer: Self-pay | Admitting: Gastroenterology

## 2015-07-06 ENCOUNTER — Other Ambulatory Visit: Payer: Self-pay | Admitting: Nurse Practitioner

## 2015-07-06 ENCOUNTER — Other Ambulatory Visit: Payer: Self-pay

## 2015-07-06 DIAGNOSIS — R195 Other fecal abnormalities: Secondary | ICD-10-CM

## 2015-07-06 LAB — CBC WITH DIFFERENTIAL/PLATELET
Basophils Absolute: 0 cells/uL (ref 0–200)
Basophils Relative: 0 %
Eosinophils Absolute: 0 cells/uL — ABNORMAL LOW (ref 15–500)
Eosinophils Relative: 0 %
HEMATOCRIT: 30.3 % — AB (ref 35.0–45.0)
Hemoglobin: 9.7 g/dL — ABNORMAL LOW (ref 11.7–15.5)
LYMPHS PCT: 9 %
Lymphs Abs: 387 cells/uL — ABNORMAL LOW (ref 850–3900)
MCH: 26.4 pg — ABNORMAL LOW (ref 27.0–33.0)
MCHC: 32 g/dL (ref 32.0–36.0)
MCV: 82.3 fL (ref 80.0–100.0)
MONO ABS: 301 {cells}/uL (ref 200–950)
MONOS PCT: 7 %
MPV: 9.8 fL (ref 7.5–12.5)
NEUTROS PCT: 84 %
Neutro Abs: 3612 cells/uL (ref 1500–7800)
Platelets: 228 10*3/uL (ref 140–400)
RBC: 3.68 MIL/uL — ABNORMAL LOW (ref 3.80–5.10)
RDW: 18.1 % — AB (ref 11.0–15.0)
WBC: 4.3 10*3/uL (ref 3.8–10.8)

## 2015-07-06 NOTE — Telephone Encounter (Signed)
I spoke to Krista [name] and she is aware to take her mom to the lab for the STAT blood work. The iFOBT is at front for her to pick up.  She is aware to go to the ED if she has problems as outlined in Eric's note

## 2015-07-06 NOTE — Telephone Encounter (Signed)
Lets have her do an iFOBT and stat CBC today. She can pick up the iFOBT from our office. We will call with results. If she continues to have black stools (OFF pepto) or has any weakness, dizzienss, chest pain, shortness of breath, syncope, near syncope, severe abdominal pain, N/V unable to keep down food or fluids) proceed to the ER.

## 2015-07-06 NOTE — Telephone Encounter (Signed)
I called and spoke to pt's daughter, Jenny Reichmann.  She said the pt had some diarrhea last Sat and had dark stools. She gave her Immodium and it didn't help so she gave her some Pepto Bismol. She had more dark stools, but she said she is sure her mom had not had the Pepto prior to the first dark stools. She has had diarrhea since and still having dark stools, but said she only gave the Pepto x 1.  Said she has some soreness right above her belly button. Routing to Walden Field, NP who saw her last in the office.

## 2015-07-06 NOTE — Telephone Encounter (Signed)
Pt's daughter Krista [name] called to make OV for Monday. Pt is having dark black stools. Daughter is wanting to know if we could give her a IFOBT test for her to pick up and try at home to see if it was positive for blood or can she purchase them OTC. Please advise and call CIndy at [phone]

## 2015-07-07 ENCOUNTER — Ambulatory Visit (INDEPENDENT_AMBULATORY_CARE_PROVIDER_SITE_OTHER): Payer: Medicare Other

## 2015-07-07 DIAGNOSIS — K921 Melena: Secondary | ICD-10-CM | POA: Diagnosis not present

## 2015-07-07 LAB — IFOBT (OCCULT BLOOD): IMMUNOLOGICAL FECAL OCCULT BLOOD TEST: POSITIVE

## 2015-07-07 NOTE — Progress Notes (Signed)
Pt returned one iFOBT and it was positive.

## 2015-07-09 ENCOUNTER — Encounter (HOSPITAL_COMMUNITY): Payer: Self-pay | Admitting: *Deleted

## 2015-07-09 ENCOUNTER — Inpatient Hospital Stay (HOSPITAL_COMMUNITY)
Admission: EM | Admit: 2015-07-09 | Discharge: 2015-08-03 | DRG: 193 | Disposition: E | Payer: Medicare Other | Attending: Pulmonary Disease | Admitting: Pulmonary Disease

## 2015-07-09 ENCOUNTER — Emergency Department (HOSPITAL_COMMUNITY): Payer: Medicare Other

## 2015-07-09 DIAGNOSIS — R1310 Dysphagia, unspecified: Secondary | ICD-10-CM | POA: Diagnosis present

## 2015-07-09 DIAGNOSIS — Z85850 Personal history of malignant neoplasm of thyroid: Secondary | ICD-10-CM

## 2015-07-09 DIAGNOSIS — A0472 Enterocolitis due to Clostridium difficile, not specified as recurrent: Secondary | ICD-10-CM | POA: Diagnosis present

## 2015-07-09 DIAGNOSIS — J189 Pneumonia, unspecified organism: Secondary | ICD-10-CM | POA: Diagnosis not present

## 2015-07-09 DIAGNOSIS — Z66 Do not resuscitate: Secondary | ICD-10-CM | POA: Diagnosis present

## 2015-07-09 DIAGNOSIS — Z85118 Personal history of other malignant neoplasm of bronchus and lung: Secondary | ICD-10-CM

## 2015-07-09 DIAGNOSIS — E119 Type 2 diabetes mellitus without complications: Secondary | ICD-10-CM | POA: Diagnosis not present

## 2015-07-09 DIAGNOSIS — I5033 Acute on chronic diastolic (congestive) heart failure: Secondary | ICD-10-CM | POA: Diagnosis present

## 2015-07-09 DIAGNOSIS — M810 Age-related osteoporosis without current pathological fracture: Secondary | ICD-10-CM | POA: Diagnosis present

## 2015-07-09 DIAGNOSIS — Z90710 Acquired absence of both cervix and uterus: Secondary | ICD-10-CM

## 2015-07-09 DIAGNOSIS — Z7982 Long term (current) use of aspirin: Secondary | ICD-10-CM

## 2015-07-09 DIAGNOSIS — G20 Parkinson's disease: Secondary | ICD-10-CM | POA: Diagnosis not present

## 2015-07-09 DIAGNOSIS — R1084 Generalized abdominal pain: Secondary | ICD-10-CM

## 2015-07-09 DIAGNOSIS — F0280 Dementia in other diseases classified elsewhere without behavioral disturbance: Secondary | ICD-10-CM | POA: Diagnosis present

## 2015-07-09 DIAGNOSIS — R627 Adult failure to thrive: Secondary | ICD-10-CM | POA: Diagnosis present

## 2015-07-09 DIAGNOSIS — K449 Diaphragmatic hernia without obstruction or gangrene: Secondary | ICD-10-CM | POA: Diagnosis present

## 2015-07-09 DIAGNOSIS — G20A1 Parkinson's disease without dyskinesia, without mention of fluctuations: Secondary | ICD-10-CM | POA: Diagnosis present

## 2015-07-09 DIAGNOSIS — Z87891 Personal history of nicotine dependence: Secondary | ICD-10-CM

## 2015-07-09 DIAGNOSIS — Z809 Family history of malignant neoplasm, unspecified: Secondary | ICD-10-CM

## 2015-07-09 DIAGNOSIS — Z8611 Personal history of tuberculosis: Secondary | ICD-10-CM | POA: Diagnosis present

## 2015-07-09 DIAGNOSIS — E785 Hyperlipidemia, unspecified: Secondary | ICD-10-CM | POA: Diagnosis present

## 2015-07-09 DIAGNOSIS — E46 Unspecified protein-calorie malnutrition: Secondary | ICD-10-CM | POA: Diagnosis present

## 2015-07-09 DIAGNOSIS — Z8541 Personal history of malignant neoplasm of cervix uteri: Secondary | ICD-10-CM

## 2015-07-09 DIAGNOSIS — B3781 Candidal esophagitis: Secondary | ICD-10-CM | POA: Diagnosis present

## 2015-07-09 DIAGNOSIS — K922 Gastrointestinal hemorrhage, unspecified: Secondary | ICD-10-CM | POA: Diagnosis not present

## 2015-07-09 DIAGNOSIS — D62 Acute posthemorrhagic anemia: Secondary | ICD-10-CM | POA: Diagnosis present

## 2015-07-09 DIAGNOSIS — I110 Hypertensive heart disease with heart failure: Secondary | ICD-10-CM | POA: Diagnosis present

## 2015-07-09 DIAGNOSIS — F0390 Unspecified dementia without behavioral disturbance: Secondary | ICD-10-CM | POA: Diagnosis present

## 2015-07-09 DIAGNOSIS — K921 Melena: Secondary | ICD-10-CM | POA: Diagnosis not present

## 2015-07-09 DIAGNOSIS — Y95 Nosocomial condition: Secondary | ICD-10-CM | POA: Diagnosis present

## 2015-07-09 DIAGNOSIS — Z923 Personal history of irradiation: Secondary | ICD-10-CM

## 2015-07-09 DIAGNOSIS — I10 Essential (primary) hypertension: Secondary | ICD-10-CM | POA: Diagnosis present

## 2015-07-09 DIAGNOSIS — Z96642 Presence of left artificial hip joint: Secondary | ICD-10-CM | POA: Diagnosis present

## 2015-07-09 DIAGNOSIS — E890 Postprocedural hypothyroidism: Secondary | ICD-10-CM | POA: Diagnosis present

## 2015-07-09 DIAGNOSIS — K219 Gastro-esophageal reflux disease without esophagitis: Secondary | ICD-10-CM | POA: Diagnosis present

## 2015-07-09 DIAGNOSIS — Z880 Allergy status to penicillin: Secondary | ICD-10-CM

## 2015-07-09 DIAGNOSIS — A047 Enterocolitis due to Clostridium difficile: Secondary | ICD-10-CM | POA: Diagnosis present

## 2015-07-09 DIAGNOSIS — Z9049 Acquired absence of other specified parts of digestive tract: Secondary | ICD-10-CM

## 2015-07-09 DIAGNOSIS — R140 Abdominal distension (gaseous): Secondary | ICD-10-CM

## 2015-07-09 LAB — COMPREHENSIVE METABOLIC PANEL
ALT: 5 U/L — ABNORMAL LOW (ref 14–54)
ANION GAP: 13 (ref 5–15)
AST: 27 U/L (ref 15–41)
Albumin: 2.2 g/dL — ABNORMAL LOW (ref 3.5–5.0)
Alkaline Phosphatase: 132 U/L — ABNORMAL HIGH (ref 38–126)
BILIRUBIN TOTAL: 1 mg/dL (ref 0.3–1.2)
BUN: 52 mg/dL — ABNORMAL HIGH (ref 6–20)
CALCIUM: 8.1 mg/dL — AB (ref 8.9–10.3)
CO2: 21 mmol/L — ABNORMAL LOW (ref 22–32)
Chloride: 105 mmol/L (ref 101–111)
Creatinine, Ser: 1.46 mg/dL — ABNORMAL HIGH (ref 0.44–1.00)
GFR calc Af Amer: 37 mL/min — ABNORMAL LOW (ref >60)
GFR, EST NON AFRICAN AMERICAN: 32 mL/min — AB (ref >60)
Glucose, Bld: 149 mg/dL — ABNORMAL HIGH (ref 65–99)
POTASSIUM: 4.1 mmol/L (ref 3.5–5.1)
Sodium: 139 mmol/L (ref 135–145)
TOTAL PROTEIN: 6.1 g/dL — AB (ref 6.5–8.1)

## 2015-07-09 LAB — CBC WITH DIFFERENTIAL/PLATELET
BASOS ABS: 0 10*3/uL (ref 0.0–0.1)
Basophils Relative: 0 %
EOS ABS: 0 10*3/uL (ref 0.0–0.7)
EOS PCT: 0 %
HEMATOCRIT: 30.6 % — AB (ref 36.0–46.0)
Hemoglobin: 10.1 g/dL — ABNORMAL LOW (ref 12.0–15.0)
LYMPHS PCT: 10 %
Lymphs Abs: 0.4 10*3/uL — ABNORMAL LOW (ref 0.7–4.0)
MCH: 27 pg (ref 26.0–34.0)
MCHC: 33 g/dL (ref 30.0–36.0)
MCV: 81.8 fL (ref 78.0–100.0)
Monocytes Absolute: 0.3 10*3/uL (ref 0.1–1.0)
Monocytes Relative: 7 %
NEUTROS PCT: 83 %
Neutro Abs: 3.7 10*3/uL (ref 1.7–7.7)
Platelets: 212 10*3/uL (ref 150–400)
RBC: 3.74 MIL/uL — ABNORMAL LOW (ref 3.87–5.11)
RDW: 18.1 % — AB (ref 11.5–15.5)
WBC: 4.5 10*3/uL (ref 4.0–10.5)

## 2015-07-09 LAB — I-STAT CG4 LACTIC ACID, ED: Lactic Acid, Venous: 1.33 mmol/L (ref 0.5–2.0)

## 2015-07-09 LAB — POC OCCULT BLOOD, ED: FECAL OCCULT BLD: NEGATIVE

## 2015-07-09 LAB — TROPONIN I: TROPONIN I: 0.04 ng/mL — AB (ref <0.031)

## 2015-07-09 MED ORDER — DEXTROSE 5 % IV SOLN
1.0000 g | Freq: Once | INTRAVENOUS | Status: AC
  Administered 2015-07-10: 1 g via INTRAVENOUS
  Filled 2015-07-09: qty 1

## 2015-07-09 MED ORDER — LEVOFLOXACIN IN D5W 500 MG/100ML IV SOLN: 500.0000 mg | Freq: Once | INTRAVENOUS | Status: DC

## 2015-07-09 MED ORDER — VANCOMYCIN HCL 500 MG IV SOLR
INTRAVENOUS | Status: AC
  Filled 2015-07-09: qty 500

## 2015-07-09 MED ORDER — VANCOMYCIN HCL 500 MG IV SOLR
500.0000 mg | Freq: Once | INTRAVENOUS | Status: AC
  Administered 2015-07-10: 500 mg via INTRAVENOUS
  Filled 2015-07-09: qty 500

## 2015-07-09 MED ORDER — DEXTROSE 5 % IV SOLN
INTRAVENOUS | Status: AC
  Filled 2015-07-09: qty 1

## 2015-07-09 MED ORDER — FUROSEMIDE 10 MG/ML IJ SOLN
20.0000 mg | Freq: Once | INTRAMUSCULAR | Status: AC
  Administered 2015-07-09: 20 mg via INTRAVENOUS
  Filled 2015-07-09: qty 2

## 2015-07-09 NOTE — ED Provider Notes (Signed)
CSN: [id number]     Arrival date & time 07/31/2015  2024 History  By signing my name below, I, Nicole Kindred, attest that this documentation has been prepared under the direction and in the presence of Tanna Furry, MD.   Electronically Signed: Nicole Kindred, ED Scribe. 07/24/2015. 11:23 PM   Chief Complaint  Patient presents with  . Weakness    The history is provided by a relative. No language interpreter was used.   HPI Comments: Krista [name] is a 82 y.o. female with PMHx of lung cancer, cervical cancer, and thyroid cancer who presents to the Emergency Department complaining of gradual onset, weakness, ongoing for about two weeks. Daughter also complains of throat and chest congestion ongoing for one week,dark colored diarrhea beginning three days ago, productive cough with brown phlegm, and difficulty breathing. No other associated symptoms noted. No worsening or alleviating factors noted. Daughter denies hematochezia, vomiting, nausea, chest pain, abdominal pain, or any other pertinent symptoms.   Past Medical History  Diagnosis Date  . Hyperlipidemia   . Osteoporosis   . Pneumonia 5/13  . GERD (gastroesophageal reflux disease)   . History of migraine headaches   . Vertigo   . History of tuberculosis 1965  . Thyroid disease 2008    thyroid cancer cured with surgery  . Hypertension   . Diabetes mellitus     controlled with medication  . Parkinson's disease ([place])   . CA - cancer     thyroid - 2008 surg  . Lung cancer ([place]) 09/04/2011    non-small cell ca in L upper lobe  . Cancer ([place])     cervical ca - 2005 - surg and radiation  . Hypothyroidism   . Thyroid disease   . Diabetes mellitus without complication ([place])   . Parkinson disease ([place])   . Dementia   . Tuberculosis    Past Surgical History  Procedure Laterality Date  . Thyroidectomy  2007  . Hip arthroplasty  2010    left  . Cholecystectomy  1994  . Cataract extraction    . Lung biopsy  09/04/2011  .  Abdominal hysterectomy  2005    complete  . Flexible sigmoidoscopy  2008    SLF: 1.angulated sigmoid colon 15 cm from anal verge. thickened sigmoid colon seen on CT. sigmoid diverticulosis 2. normal retrofexed view of rectum  . Thyroidectomy    . Abdominal hysterectomy    . Cholecystectomy     Family History  Problem Relation Age of Onset  . Lung disease    . Cancer Sister     breast  . Cancer Sister     brain  . Cancer Sister     ovarian    Social History  Substance Use Topics  . Smoking status: Former Smoker -- 0.25 packs/day for 20 years    Types: Cigarettes    Quit date: 03/04/1982  . Smokeless tobacco: None  . Alcohol Use: No   OB History    Gravida Para Term Preterm AB TAB SAB Ectopic Multiple Living   0 0 0 0 0 0 0 0       Review of Systems  Constitutional: Negative for fever, chills, diaphoresis, appetite change and fatigue.  HENT: Positive for congestion. Negative for mouth sores, sore throat and trouble swallowing.   Eyes: Negative for visual disturbance.  Respiratory: Positive for cough and shortness of breath. Negative for chest tightness and wheezing.   Cardiovascular: Negative for chest pain.  Gastrointestinal: Positive for  diarrhea. Negative for nausea, vomiting, abdominal pain, blood in stool and abdominal distention.  Endocrine: Negative for polydipsia, polyphagia and polyuria.  Genitourinary: Negative for dysuria, frequency and hematuria.  Musculoskeletal: Negative for gait problem.  Skin: Negative for color change, pallor and rash.  Neurological: Positive for weakness. Negative for dizziness, syncope, light-headedness and headaches.  Hematological: Does not bruise/bleed easily.  Psychiatric/Behavioral: Negative for behavioral problems and confusion.     Allergies  Amoxicillin; Statins; Sulfa antibiotics; and Sulfonamide derivatives  Home Medications   Prior to Admission medications   Medication Sig Start Date End Date Taking? Authorizing  Provider  aspirin EC 81 MG tablet Take 81 mg by mouth daily.   Yes Historical Provider, MD  carbidopa-levodopa (SINEMET IR) 25-100 MG per tablet Take 1 tablet by mouth 3 (three) times daily.    Yes Historical Provider, MD  Cyanocobalamin (VITAMIN B 12 PO) Take 500 mcg by mouth daily.   Yes Historical Provider, MD  dextromethorphan-guaiFENesin (MUCINEX DM) 30-600 MG 12hr tablet Take 1 tablet by mouth 2 (two) times daily as needed for cough.   Yes Historical Provider, MD  ketorolac (ACULAR) 0.5 % ophthalmic solution Place 1 drop into both eyes 2 (two) times daily.  03/06/15  Yes Historical Provider, MD  levothyroxine (SYNTHROID, LEVOTHROID) 75 MCG tablet TAKE 1 TABLET BY MOUTH DAILY BEFORE BREAKFAST. 06/15/15  Yes Cassandria Anger, MD  loperamide (IMODIUM) 2 MG capsule Take 2 mg by mouth as needed for diarrhea or loose stools.   Yes Historical Provider, MD  meclizine (ANTIVERT) 25 MG tablet Take 25 mg by mouth 3 (three) times daily as needed for dizziness.    Yes Historical Provider, MD  QUEtiapine (SEROQUEL) 25 MG tablet Take 2 tablets by mouth at bedtime. 06/19/15  Yes Historical Provider, MD  rivastigmine (EXELON) 1.5 MG capsule Take 1.5 mg by mouth at bedtime.    Yes Historical Provider, MD  rosuvastatin (CRESTOR) 20 MG tablet Take 20 mg by mouth every evening.    Yes Historical Provider, MD  TRADJENTA 5 MG TABS tablet TAKE (1) TABLET BY MOUTH EACH MORNING. 06/15/15  Yes Cassandria Anger, MD  ondansetron (ZOFRAN ODT) 8 MG disintegrating tablet Take 0.5 tablets (4 mg total) by mouth every 8 (eight) hours as needed for nausea or vomiting. 03/07/15   Pattricia Boss, MD   BP 125/73 mmHg  Pulse 98  Temp(Src) 98.4 F (36.9 C) (Oral)  Resp 26  Ht '5\' 2"'$  (1.575 m)  Wt 103 lb (46.72 kg)  BMI 18.83 kg/m2  SpO2 91% Physical Exam  Constitutional: She is oriented to person, place, and time. She appears well-developed and well-nourished. No distress.  HENT:  Head: Normocephalic.  Mouth/Throat: Mucous  membranes are dry.  Eyes: Pupils are equal, round, and reactive to light. No scleral icterus.  Pale conjunctiva.   Neck: Normal range of motion. Neck supple. No thyromegaly present.  Cardiovascular: Normal rate and regular rhythm.  Exam reveals no gallop and no friction rub.   No murmur heard. Pulmonary/Chest: Effort normal. No respiratory distress. She has no wheezes. She has rhonchi. She has no rales.  Diffuse scattered rhonchi.   Abdominal: Soft. Bowel sounds are normal. She exhibits no distension. There is no tenderness. There is no rebound.  Musculoskeletal: Normal range of motion.  Neurological: She is alert and oriented to person, place, and time.  Skin: Skin is warm and dry. No rash noted.  Psychiatric: She has a normal mood and affect. Her behavior is normal.  ED Course  Procedures (including critical care time) DIAGNOSTIC STUDIES: Oxygen Saturation is 91% on RA, low by my interpretation.    COORDINATION OF CARE: 9:04 PM Discussed treatment plan which includes DG chest portable 1 view, CBC with differential/platelet, CMP, troponin I, urinalysis, and EKG with pt at bedside and pt agreed to plan.  Labs Review Labs Reviewed  CBC WITH DIFFERENTIAL/PLATELET - Abnormal; Notable for the following:    RBC 3.74 (*)    Hemoglobin 10.1 (*)    HCT 30.6 (*)    RDW 18.1 (*)    Lymphs Abs 0.4 (*)    All other components within normal limits  COMPREHENSIVE METABOLIC PANEL - Abnormal; Notable for the following:    CO2 21 (*)    Glucose, Bld 149 (*)    BUN 52 (*)    Creatinine, Ser 1.46 (*)    Calcium 8.1 (*)    Total Protein 6.1 (*)    Albumin 2.2 (*)    ALT 5 (*)    Alkaline Phosphatase 132 (*)    GFR calc non Af Amer 32 (*)    GFR calc Af Amer 37 (*)    All other components within normal limits  TROPONIN I - Abnormal; Notable for the following:    Troponin I 0.04 (*)    All other components within normal limits  URINE CULTURE  URINALYSIS, ROUTINE W REFLEX MICROSCOPIC (NOT  AT [facility])  I-STAT CG4 LACTIC ACID, ED  POC OCCULT BLOOD, ED    Imaging Review Dg Chest Portable 1 View  07/08/2015  CLINICAL DATA:  Productive cough for 1 week. EXAM: PORTABLE CHEST 1 VIEW COMPARISON:  06/14/2015 CT 06/23/2014 radiographs FINDINGS: Left lower lung opacity representing atelectasis versus airspace disease noted with small left pleural effusion. There is no evidence of pneumothorax. Cardiomediastinal silhouette is otherwise unchanged. Surgical clips overlying the lower neck/ upper chest noted. IMPRESSION: Left lower lung opacity representing atelectasis and/or pneumonia. Small left pleural effusion. Electronically Signed   By: Margarette Canada M.D.   On: 07/11/2015 21:20   I have personally reviewed and evaluated these images and lab results as part of my medical decision-making.   EKG Interpretation None      MDM   Final diagnoses:  HCAP (healthcare-associated pneumonia)   Saturations 89-90% on room air. On 2 L she is in the mid 90s. Chest x-ray shows new left infiltrate and effusion not noted at basilar cuts on CT scan in April. Treated with vancomycin and aztreonam as patient has penicillin allergy. I discussed the case with Dr. Darrick Meigs of [facility] patient will be admitted.  I personally performed the services described in this documentation, which was scribed in my presence. The recorded information has been reviewed and is accurate.    Tanna Furry, MD 07/12/2015 [phone]

## 2015-07-09 NOTE — ED Notes (Signed)
Pt arrived to er for further evaluation of generalized weakness, pt states that she started getting sick two weeks ago with n/v/d, that went away then she started having a cough that is productive with brown sputum production,

## 2015-07-09 NOTE — Progress Notes (Signed)
ANTIBIOTIC CONSULT NOTE-Preliminary  Pharmacy Consult for Vancomycin and Aztreonam Indication: Pneumonia  Allergies  Allergen Reactions  . Amoxicillin Nausea Only  . Statins Nausea And Vomiting  . Sulfa Antibiotics Itching  . Sulfonamide Derivatives Nausea And Vomiting    Patient Measurements: Height: '5\' 2"'$  (157.5 cm) Weight: 103 lb (46.72 kg) IBW/kg (Calculated) : 50.1   Vital Signs: Temp: 99.5 F (37.5 C) (05/07 2207) Temp Source: Rectal (05/07 2207) BP: 108/67 mmHg (05/07 2200) Pulse Rate: 95 (05/07 2200)  Labs:  Recent Labs  07/12/2015 2142  WBC 4.5  HGB 10.1*  PLT 212  CREATININE 1.46*    Estimated Creatinine Clearance: 21.9 mL/min (by C-G formula based on Cr of 1.46).  No results for input(s): VANCOTROUGH, VANCOPEAK, VANCORANDOM, GENTTROUGH, GENTPEAK, GENTRANDOM, TOBRATROUGH, TOBRAPEAK, TOBRARND, AMIKACINPEAK, AMIKACINTROU, AMIKACIN in the last 72 hours.   Microbiology: Recent Results (from the past 720 hour(s))  Urine culture     Status: Abnormal   Collection Time: 06/19/15 10:54 AM  Result Value Ref Range Status   Specimen Description URINE, CLEAN CATCH  Final   Special Requests Normal  Final   Culture (A)  Final    >=100,000 COLONIES/mL AEROCOCCUS URINAE Standardized susceptibility testing for this organism is not available. Performed at [facility]    Report Status 06/22/2015 FINAL  Final    Medical History: Past Medical History  Diagnosis Date  . Hyperlipidemia   . Osteoporosis   . Pneumonia 5/13  . GERD (gastroesophageal reflux disease)   . History of migraine headaches   . Vertigo   . History of tuberculosis 1965  . Thyroid disease 2008    thyroid cancer cured with surgery  . Hypertension   . Diabetes mellitus     controlled with medication  . Parkinson's disease ([place])   . CA - cancer     thyroid - 2008 surg  . Lung cancer ([place]) 09/04/2011    non-small cell ca in L upper lobe  . Cancer ([place])     cervical ca - 2005 - surg  and radiation  . Hypothyroidism   . Thyroid disease   . Diabetes mellitus without complication ([place])   . Parkinson disease ([place])   . Dementia   . Tuberculosis     Medications:  Levofloxacin 500 mg IV x 1 dose in the ED  Assessment: 82 yo female with past history sig for lung cancer seen in the ED with ongoing weakness x 2 weeks; chest congestion x 1 week, and 3 day hx of diarrhea, productive cough and difficulty breathing. Chest x-ray shows new left infiltrate and effusion since prior CT in April. Empiric antibiotics for pneumonia.  Goal of Therapy:  Vancomycin troughs 15-20 mcg/ml Eradicate infection  Plan:  Preliminary review of pertinent patient information completed.  Protocol will be initiated with one-time doses of Aztreonam 1 Gm IV and Vancomycin 500 mg IV.  Forestine Na clinical pharmacist will complete review during morning rounds to assess patient and finalize treatment regimen.  Norberto Sorenson, [facility] 07/19/2015,11:37 PM

## 2015-07-09 NOTE — H&P (Signed)
TRH H&P   Patient Demographics:    Krista [name], is a 82 y.o. female  MRN: [id number]   DOB - 03-25-1931  Admit Date - 08/01/2015  Outpatient Primary MD for the patient is Alonza Bogus, MD  Referring MD/NP/PA: Dr. Jeneen Rinks  Outpatient Specialists: Dr Gala Romney  Patient coming from: Home  Chief Complaint  Patient presents with  . Weakness      HPI:    Krista [name]  is a 82 y.o. female, With history of Parkinson disease, hypertension, diabetes mellitus, dementia, lung cancer, compression fracture who was recently discharged from the hospital after treated for small bowel obstruction, which resolved conservatively. Today patient was brought to the hospital for generalized weakness, cough and shortness of breath. As per patient daughter patient has been having these symptoms since Tuesday. She also had diarrhea which improved after she was given Lomotil and Pepto-Bismol on Friday. She hasn't had a bowel movement since Friday. Patient denies any chest pain at this time. She is requiring oxygen. Has been coughing. In the ED chest x-ray showed possible pneumonia and patient started on vancomycin and Azactam empirically HCAP.   She denies nausea and vomiting. No abdominal pain. No fever or chills.     Review of systems:    In addition to the HPI above, *  No Headache, No changes with Vision or hearing, No problems swallowing food or Liquids, No Blood in stool or Urine, No dysuria, No new skin rashes or bruises, No new joints pains-aches,   A full 10 point Review of Systems was done, except as stated above, all other Review of Systems were negative.   With Past History of the following :    Past Medical History  Diagnosis Date  . Hyperlipidemia   . Osteoporosis   . Pneumonia 5/13  . GERD (gastroesophageal reflux disease)   . History of migraine headaches   . Vertigo   . History of  tuberculosis 1965  . Thyroid disease 2008    thyroid cancer cured with surgery  . Hypertension   . Diabetes mellitus     controlled with medication  . Parkinson's disease ([place])   . CA - cancer     thyroid - 2008 surg  . Lung cancer ([place]) 09/04/2011    non-small cell ca in L upper lobe  . Cancer ([place])     cervical ca - 2005 - surg and radiation  . Hypothyroidism   . Thyroid disease   . Diabetes mellitus without complication ([place])   . Parkinson disease ([place])   . Dementia   . Tuberculosis       Past Surgical History  Procedure Laterality Date  . Thyroidectomy  2007  . Hip arthroplasty  2010    left  . Cholecystectomy  1994  . Cataract extraction    . Lung biopsy  09/04/2011  . Abdominal hysterectomy  2005    complete  . Flexible sigmoidoscopy  2008    SLF: 1.angulated sigmoid colon 15 cm from anal verge. thickened  sigmoid colon seen on CT. sigmoid diverticulosis 2. normal retrofexed view of rectum  . Thyroidectomy    . Abdominal hysterectomy    . Cholecystectomy        Social History:     Social History  Substance Use Topics  . Smoking status: Former Smoker -- 0.25 packs/day for 20 years    Types: Cigarettes    Quit date: 03/04/1982  . Smokeless tobacco: Not on file  . Alcohol Use: No     Lives - At home with daughter and her husband  Mobility - mobile with the help of walker     Family History :     Family History  Problem Relation Age of Onset  . Lung disease    . Cancer Sister     breast  . Cancer Sister     brain  . Cancer Sister     ovarian       Home Medications:   Prior to Admission medications   Medication Sig Start Date End Date Taking? Authorizing Provider  aspirin EC 81 MG tablet Take 81 mg by mouth daily.   Yes Historical Provider, MD  carbidopa-levodopa (SINEMET IR) 25-100 MG per tablet Take 1 tablet by mouth 3 (three) times daily.    Yes Historical Provider, MD  Cyanocobalamin (VITAMIN B 12 PO) Take 500 mcg by mouth daily.   Yes  Historical Provider, MD  dextromethorphan-guaiFENesin (MUCINEX DM) 30-600 MG 12hr tablet Take 1 tablet by mouth 2 (two) times daily as needed for cough.   Yes Historical Provider, MD  ketorolac (ACULAR) 0.5 % ophthalmic solution Place 1 drop into both eyes 2 (two) times daily.  03/06/15  Yes Historical Provider, MD  levothyroxine (SYNTHROID, LEVOTHROID) 75 MCG tablet TAKE 1 TABLET BY MOUTH DAILY BEFORE BREAKFAST. 06/15/15  Yes Cassandria Anger, MD  loperamide (IMODIUM) 2 MG capsule Take 2 mg by mouth as needed for diarrhea or loose stools.   Yes Historical Provider, MD  meclizine (ANTIVERT) 25 MG tablet Take 25 mg by mouth 3 (three) times daily as needed for dizziness.    Yes Historical Provider, MD  QUEtiapine (SEROQUEL) 25 MG tablet Take 2 tablets by mouth at bedtime. 06/19/15  Yes Historical Provider, MD  rivastigmine (EXELON) 1.5 MG capsule Take 1.5 mg by mouth at bedtime.    Yes Historical Provider, MD  rosuvastatin (CRESTOR) 20 MG tablet Take 20 mg by mouth every evening.    Yes Historical Provider, MD  TRADJENTA 5 MG TABS tablet TAKE (1) TABLET BY MOUTH EACH MORNING. 06/15/15  Yes Cassandria Anger, MD  ondansetron (ZOFRAN ODT) 8 MG disintegrating tablet Take 0.5 tablets (4 mg total) by mouth every 8 (eight) hours as needed for nausea or vomiting. 03/07/15   Pattricia Boss, MD     Allergies:     Allergies  Allergen Reactions  . Amoxicillin Nausea Only  . Statins Nausea And Vomiting  . Sulfa Antibiotics Itching  . Sulfonamide Derivatives Nausea And Vomiting     Physical Exam:   Vitals  Blood pressure 111/70, pulse 97, temperature 99.5 F (37.5 C), temperature source Rectal, resp. rate 23, height '5\' 2"'$  (1.575 m), weight 46.72 kg (103 lb), SpO2 94 %.   1. General Frail looking elderly woman lying in bed in NAD, cooperative with exam  2. Normal affect and insight, Not Suicidal or Homicidal, Awake Alert, Oriented X 3.  3. No F.N deficits, ALL C.Nerves Intact, Strength 5/5 all 4  extremities, Sensation intact all 4 extremities,  Plantars down going.  4. Ears and Eyes appear Normal, Conjunctivae clear, PERRLA. Moist Oral Mucosa.  5. Supple Neck, No JVD, No cervical lymphadenopathy appriciated, No Carotid Bruits.  6. Symmetrical Chest wall movement, Good air movement bilaterally, bilateral crackles at lung bases  7. RRR, No Gallops, Rubs or Murmurs, No Parasternal Heave.  8. Positive Bowel Sounds, Abdomen Soft, No tenderness, No organomegaly appriciated,No rebound -guarding or rigidity.  9.  No Cyanosis, Normal Skin Turgor, No Skin Rash or Bruise.  10. Good muscle tone,  joints appear normal , no effusions, Normal ROM.      Data Review:    CBC  Recent Labs Lab 07/06/15 1632 07/03/2015 2142  WBC 4.3 4.5  HGB 9.7* 10.1*  HCT 30.3* 30.6*  PLT 228 212  MCV 82.3 81.8  MCH 26.4* 27.0  MCHC 32.0 33.0  RDW 18.1* 18.1*  LYMPHSABS 387* 0.4*  MONOABS 301 0.3  EOSABS 0* 0.0  BASOSABS 0 0.0   ------------------------------------------------------------------------------------------------------------------  Chemistries   Recent Labs Lab 07/06/2015 2142  NA 139  K 4.1  CL 105  CO2 21*  GLUCOSE 149*  BUN 52*  CREATININE 1.46*  CALCIUM 8.1*  AST 27  ALT 5*  ALKPHOS 132*  BILITOT 1.0   --------------------------------------------------------------------------------------------------------------------  Cardiac Enzymes  Recent Labs Lab 07/05/2015 2142  TROPONINI 0.04*   ------------------------------------------------------------------------------------------------------------------ No results found for: BNP   ---------------------------------------------------------------------------------------------------------------  Urinalysis    Component Value Date/Time   COLORURINE YELLOW 06/26/2015 2020   APPEARANCEUR CLEAR 06/26/2015 2020   LABSPEC 1.025 06/26/2015 2020   PHURINE 5.5 06/26/2015 2020   GLUCOSEU NEGATIVE 06/26/2015 2020   HGBUR  MODERATE* 06/26/2015 2020   HGBUR negative 04/05/2008 0000   BILIRUBINUR NEGATIVE 06/26/2015 2020   KETONESUR TRACE* 06/26/2015 2020   PROTEINUR 30* 06/26/2015 2020   UROBILINOGEN 0.2 11/27/2014 1517   NITRITE NEGATIVE 06/26/2015 2020   LEUKOCYTESUR NEGATIVE 06/26/2015 2020    ----------------------------------------------------------------------------------------------------------------   Imaging Results:    Dg Chest Portable 1 View  07/08/2015  CLINICAL DATA:  Productive cough for 1 week. EXAM: PORTABLE CHEST 1 VIEW COMPARISON:  06/14/2015 CT 06/23/2014 radiographs FINDINGS: Left lower lung opacity representing atelectasis versus airspace disease noted with small left pleural effusion. There is no evidence of pneumothorax. Cardiomediastinal silhouette is otherwise unchanged. Surgical clips overlying the lower neck/ upper chest noted. IMPRESSION: Left lower lung opacity representing atelectasis and/or pneumonia. Small left pleural effusion. Electronically Signed   By: Margarette Canada M.D.   On: 07/11/2015 21:20    My personal review of EKG: Rhythm NSR, Rate 97   /min, QTc  ,481 no Acute ST changes   Assessment & Plan:    Active Problems:   Diabetes mellitus without complication (HCC)   THYROID CANCER, HX OF   TUBERCULOSIS, HX OF   Parkinson's disease ([place])   HCAP (healthcare-associated pneumonia)     1. Healthcare associated pneumonia- patient has been empirically started on vancomycin and Azactam for possible pneumonia. Follow blood cultures, strep pneumo urinary antigen. 2. Acute on chronic diastolic heart failure- patient has history of grade 1 diastolic dysfunction, she has bibasilar crackles on exam. Mild elevation of troponin. Will give 1 dose of Lasix 20 mg IV in the ED. Follow intake and output. Will check BNP, if elevated consider starting scheduled Lasix from tomorrow morning. 3. History of Parkinson disease- continue Sinemet 4. Diabetes mellitus- continue sliding scale  insulin with NovoLog 5. Hypothyroidism-continue Synthroid 6. Dementia- no behavior disturbance, continue Exelon   DVT Prophylaxis-  Lovenox   AM Labs Ordered, also please review Full Orders  Family Communication: Admission, patients condition and plan of care including tests being ordered have been discussed with the patient and hard daughter at bedside* who indicate understanding and agree with the plan and Code Status.  Code Status:  DO NOT RESUSCITATE  Admission status: Observation  Time spent in minutes : 60 minutes   LAMA,GAGAN S M.D on 08/01/2015 at 11:50 PM  Between 7am to 7pm - Pager - [phone]. After 7pm go to [email or web address] - password [facility]  [facility] - Office  [phone]

## 2015-07-09 NOTE — ED Notes (Signed)
Patient requests bed pan, unable to urinate at this time.

## 2015-07-10 ENCOUNTER — Ambulatory Visit: Payer: Medicare Other | Admitting: Gastroenterology

## 2015-07-10 ENCOUNTER — Encounter (HOSPITAL_COMMUNITY): Payer: Self-pay | Admitting: *Deleted

## 2015-07-10 DIAGNOSIS — G20 Parkinson's disease: Secondary | ICD-10-CM | POA: Diagnosis present

## 2015-07-10 DIAGNOSIS — Y95 Nosocomial condition: Secondary | ICD-10-CM | POA: Diagnosis present

## 2015-07-10 DIAGNOSIS — K449 Diaphragmatic hernia without obstruction or gangrene: Secondary | ICD-10-CM | POA: Diagnosis present

## 2015-07-10 DIAGNOSIS — I110 Hypertensive heart disease with heart failure: Secondary | ICD-10-CM | POA: Diagnosis present

## 2015-07-10 DIAGNOSIS — Z809 Family history of malignant neoplasm, unspecified: Secondary | ICD-10-CM | POA: Diagnosis not present

## 2015-07-10 DIAGNOSIS — K921 Melena: Secondary | ICD-10-CM | POA: Diagnosis not present

## 2015-07-10 DIAGNOSIS — A047 Enterocolitis due to Clostridium difficile: Secondary | ICD-10-CM | POA: Diagnosis present

## 2015-07-10 DIAGNOSIS — Z87891 Personal history of nicotine dependence: Secondary | ICD-10-CM | POA: Diagnosis not present

## 2015-07-10 DIAGNOSIS — Z7982 Long term (current) use of aspirin: Secondary | ICD-10-CM | POA: Diagnosis not present

## 2015-07-10 DIAGNOSIS — Z923 Personal history of irradiation: Secondary | ICD-10-CM | POA: Diagnosis not present

## 2015-07-10 DIAGNOSIS — R110 Nausea: Secondary | ICD-10-CM | POA: Diagnosis not present

## 2015-07-10 DIAGNOSIS — Z8611 Personal history of tuberculosis: Secondary | ICD-10-CM | POA: Diagnosis not present

## 2015-07-10 DIAGNOSIS — Z880 Allergy status to penicillin: Secondary | ICD-10-CM | POA: Diagnosis not present

## 2015-07-10 DIAGNOSIS — K219 Gastro-esophageal reflux disease without esophagitis: Secondary | ICD-10-CM | POA: Diagnosis present

## 2015-07-10 DIAGNOSIS — Z66 Do not resuscitate: Secondary | ICD-10-CM | POA: Diagnosis present

## 2015-07-10 DIAGNOSIS — K3189 Other diseases of stomach and duodenum: Secondary | ICD-10-CM | POA: Diagnosis not present

## 2015-07-10 DIAGNOSIS — J189 Pneumonia, unspecified organism: Secondary | ICD-10-CM | POA: Diagnosis present

## 2015-07-10 DIAGNOSIS — Z85850 Personal history of malignant neoplasm of thyroid: Secondary | ICD-10-CM | POA: Diagnosis not present

## 2015-07-10 DIAGNOSIS — D62 Acute posthemorrhagic anemia: Secondary | ICD-10-CM | POA: Diagnosis not present

## 2015-07-10 DIAGNOSIS — E785 Hyperlipidemia, unspecified: Secondary | ICD-10-CM | POA: Diagnosis present

## 2015-07-10 DIAGNOSIS — Z96642 Presence of left artificial hip joint: Secondary | ICD-10-CM | POA: Diagnosis present

## 2015-07-10 DIAGNOSIS — E890 Postprocedural hypothyroidism: Secondary | ICD-10-CM | POA: Diagnosis present

## 2015-07-10 DIAGNOSIS — Z9049 Acquired absence of other specified parts of digestive tract: Secondary | ICD-10-CM | POA: Diagnosis not present

## 2015-07-10 DIAGNOSIS — Z85118 Personal history of other malignant neoplasm of bronchus and lung: Secondary | ICD-10-CM | POA: Diagnosis not present

## 2015-07-10 DIAGNOSIS — M810 Age-related osteoporosis without current pathological fracture: Secondary | ICD-10-CM | POA: Diagnosis present

## 2015-07-10 DIAGNOSIS — Z90710 Acquired absence of both cervix and uterus: Secondary | ICD-10-CM | POA: Diagnosis not present

## 2015-07-10 DIAGNOSIS — F0280 Dementia in other diseases classified elsewhere without behavioral disturbance: Secondary | ICD-10-CM | POA: Diagnosis present

## 2015-07-10 DIAGNOSIS — Z8541 Personal history of malignant neoplasm of cervix uteri: Secondary | ICD-10-CM | POA: Diagnosis not present

## 2015-07-10 DIAGNOSIS — R627 Adult failure to thrive: Secondary | ICD-10-CM | POA: Diagnosis present

## 2015-07-10 DIAGNOSIS — R140 Abdominal distension (gaseous): Secondary | ICD-10-CM | POA: Diagnosis not present

## 2015-07-10 DIAGNOSIS — E46 Unspecified protein-calorie malnutrition: Secondary | ICD-10-CM | POA: Diagnosis present

## 2015-07-10 DIAGNOSIS — E119 Type 2 diabetes mellitus without complications: Secondary | ICD-10-CM | POA: Diagnosis present

## 2015-07-10 DIAGNOSIS — I5033 Acute on chronic diastolic (congestive) heart failure: Secondary | ICD-10-CM | POA: Diagnosis present

## 2015-07-10 DIAGNOSIS — B3781 Candidal esophagitis: Secondary | ICD-10-CM | POA: Diagnosis present

## 2015-07-10 DIAGNOSIS — D649 Anemia, unspecified: Secondary | ICD-10-CM | POA: Diagnosis not present

## 2015-07-10 LAB — TROPONIN I
TROPONIN I: 0.04 ng/mL — AB (ref <0.031)
Troponin I: 0.04 ng/mL — ABNORMAL HIGH (ref <0.031)
Troponin I: 0.04 ng/mL — ABNORMAL HIGH (ref <0.031)

## 2015-07-10 LAB — CBC
HCT: 28.5 % — ABNORMAL LOW (ref 36.0–46.0)
HEMOGLOBIN: 9.4 g/dL — AB (ref 12.0–15.0)
MCH: 26.7 pg (ref 26.0–34.0)
MCHC: 33 g/dL (ref 30.0–36.0)
MCV: 81 fL (ref 78.0–100.0)
PLATELETS: 235 10*3/uL (ref 150–400)
RBC: 3.52 MIL/uL — AB (ref 3.87–5.11)
RDW: 18 % — ABNORMAL HIGH (ref 11.5–15.5)
WBC: 4.4 10*3/uL (ref 4.0–10.5)

## 2015-07-10 LAB — URINALYSIS, ROUTINE W REFLEX MICROSCOPIC
Bilirubin Urine: NEGATIVE
GLUCOSE, UA: NEGATIVE mg/dL
LEUKOCYTES UA: NEGATIVE
Nitrite: NEGATIVE
PH: 5 (ref 5.0–8.0)
Protein, ur: 30 mg/dL — AB
SPECIFIC GRAVITY, URINE: 1.02 (ref 1.005–1.030)

## 2015-07-10 LAB — COMPREHENSIVE METABOLIC PANEL
ALBUMIN: 2 g/dL — AB (ref 3.5–5.0)
ALK PHOS: 123 U/L (ref 38–126)
ALT: 7 U/L — AB (ref 14–54)
AST: 28 U/L (ref 15–41)
Anion gap: 13 (ref 5–15)
BUN: 53 mg/dL — ABNORMAL HIGH (ref 6–20)
CALCIUM: 7.7 mg/dL — AB (ref 8.9–10.3)
CO2: 23 mmol/L (ref 22–32)
CREATININE: 1.55 mg/dL — AB (ref 0.44–1.00)
Chloride: 103 mmol/L (ref 101–111)
GFR calc Af Amer: 35 mL/min — ABNORMAL LOW (ref >60)
GFR calc non Af Amer: 30 mL/min — ABNORMAL LOW (ref >60)
GLUCOSE: 148 mg/dL — AB (ref 65–99)
Potassium: 3.5 mmol/L (ref 3.5–5.1)
SODIUM: 139 mmol/L (ref 135–145)
Total Bilirubin: 1.2 mg/dL (ref 0.3–1.2)
Total Protein: 5.6 g/dL — ABNORMAL LOW (ref 6.5–8.1)

## 2015-07-10 LAB — GLUCOSE, CAPILLARY
Glucose-Capillary: 153 mg/dL — ABNORMAL HIGH (ref 65–99)
Glucose-Capillary: 126 mg/dL — ABNORMAL HIGH (ref 65–99)
Glucose-Capillary: 172 mg/dL — ABNORMAL HIGH (ref 65–99)
Glucose-Capillary: 149 mg/dL — ABNORMAL HIGH (ref 65–99)
Glucose-Capillary: 90 mg/dL (ref 65–99)

## 2015-07-10 LAB — BRAIN NATRIURETIC PEPTIDE: B Natriuretic Peptide: 219 pg/mL — ABNORMAL HIGH (ref 0.0–100.0)

## 2015-07-10 LAB — URINE MICROSCOPIC-ADD ON

## 2015-07-10 LAB — STREP PNEUMONIAE URINARY ANTIGEN: Strep Pneumo Urinary Antigen: NEGATIVE

## 2015-07-10 MED ORDER — IPRATROPIUM-ALBUTEROL 0.5-2.5 (3) MG/3ML IN SOLN
3.0000 mL | RESPIRATORY_TRACT | Status: DC
  Administered 2015-07-10 – 2015-07-11 (×7): 3 mL via RESPIRATORY_TRACT
  Filled 2015-07-10 (×7): qty 3

## 2015-07-10 MED ORDER — ASPIRIN EC 81 MG PO TBEC
81.0000 mg | DELAYED_RELEASE_TABLET | Freq: Every day | ORAL | Status: DC
  Administered 2015-07-10 – 2015-07-12 (×3): 81 mg via ORAL
  Filled 2015-07-10 (×3): qty 1

## 2015-07-10 MED ORDER — ENOXAPARIN SODIUM 30 MG/0.3ML ~~LOC~~ SOLN
30.0000 mg | SUBCUTANEOUS | Status: DC
  Administered 2015-07-10 – 2015-07-12 (×3): 30 mg via SUBCUTANEOUS
  Filled 2015-07-10 (×3): qty 0.3

## 2015-07-10 MED ORDER — ONDANSETRON HCL 4 MG PO TABS: 4.0000 mg | ORAL_TABLET | Freq: Four times a day (QID) | ORAL | Status: DC | PRN

## 2015-07-10 MED ORDER — VANCOMYCIN HCL 500 MG IV SOLR
500.0000 mg | INTRAVENOUS | Status: DC
  Administered 2015-07-10 – 2015-07-12 (×3): 500 mg via INTRAVENOUS
  Filled 2015-07-10 (×3): qty 500

## 2015-07-10 MED ORDER — BOOST / RESOURCE BREEZE PO LIQD
1.0000 | Freq: Three times a day (TID) | ORAL | Status: DC
  Administered 2015-07-10 – 2015-07-14 (×11): 1 via ORAL

## 2015-07-10 MED ORDER — RIVASTIGMINE TARTRATE 1.5 MG PO CAPS
1.5000 mg | ORAL_CAPSULE | Freq: Every day | ORAL | Status: DC
  Administered 2015-07-10 – 2015-07-13 (×4): 1.5 mg via ORAL
  Filled 2015-07-10 (×7): qty 1

## 2015-07-10 MED ORDER — SODIUM CHLORIDE 0.9 % IV SOLN
INTRAVENOUS | Status: DC
  Administered 2015-07-10: 03:00:00 via INTRAVENOUS

## 2015-07-10 MED ORDER — ROSUVASTATIN CALCIUM 20 MG PO TABS
20.0000 mg | ORAL_TABLET | Freq: Every evening | ORAL | Status: DC
  Administered 2015-07-10 – 2015-07-13 (×4): 20 mg via ORAL
  Filled 2015-07-10 (×5): qty 1

## 2015-07-10 MED ORDER — LEVOTHYROXINE SODIUM 75 MCG PO TABS
75.0000 ug | ORAL_TABLET | Freq: Every day | ORAL | Status: DC
  Administered 2015-07-10 – 2015-07-14 (×4): 75 ug via ORAL
  Filled 2015-07-10 (×5): qty 1

## 2015-07-10 MED ORDER — QUETIAPINE FUMARATE 25 MG PO TABS
50.0000 mg | ORAL_TABLET | Freq: Every day | ORAL | Status: DC
  Administered 2015-07-10 – 2015-07-13 (×4): 50 mg via ORAL
  Filled 2015-07-10 (×2): qty 2
  Filled 2015-07-10: qty 1
  Filled 2015-07-10 (×4): qty 2

## 2015-07-10 MED ORDER — SODIUM CHLORIDE 0.9 % IV SOLN: 250.0000 mL | INTRAVENOUS | Status: DC | PRN

## 2015-07-10 MED ORDER — SODIUM CHLORIDE 0.9% FLUSH: 3.0000 mL | INTRAVENOUS | Status: DC | PRN

## 2015-07-10 MED ORDER — METHYLPREDNISOLONE SODIUM SUCC 40 MG IJ SOLR
40.0000 mg | Freq: Two times a day (BID) | INTRAMUSCULAR | Status: DC
  Administered 2015-07-10 – 2015-07-14 (×9): 40 mg via INTRAVENOUS
  Filled 2015-07-10 (×9): qty 1

## 2015-07-10 MED ORDER — MECLIZINE HCL 12.5 MG PO TABS: 25.0000 mg | ORAL_TABLET | Freq: Three times a day (TID) | ORAL | Status: DC | PRN

## 2015-07-10 MED ORDER — ONDANSETRON HCL 4 MG/2ML IJ SOLN
4.0000 mg | Freq: Four times a day (QID) | INTRAMUSCULAR | Status: DC | PRN
  Administered 2015-07-12 – 2015-07-13 (×2): 4 mg via INTRAVENOUS
  Filled 2015-07-10 (×2): qty 2

## 2015-07-10 MED ORDER — ALBUTEROL SULFATE (2.5 MG/3ML) 0.083% IN NEBU: 2.5000 mg | INHALATION_SOLUTION | RESPIRATORY_TRACT | Status: DC | PRN

## 2015-07-10 MED ORDER — DEXTROSE 5 % IV SOLN
1.0000 g | Freq: Two times a day (BID) | INTRAVENOUS | Status: DC
  Administered 2015-07-10 – 2015-07-14 (×9): 1 g via INTRAVENOUS
  Filled 2015-07-10 (×15): qty 1

## 2015-07-10 MED ORDER — GUAIFENESIN ER 600 MG PO TB12
1200.0000 mg | ORAL_TABLET | Freq: Two times a day (BID) | ORAL | Status: DC
  Administered 2015-07-10 – 2015-07-14 (×9): 1200 mg via ORAL
  Filled 2015-07-10 (×10): qty 2

## 2015-07-10 MED ORDER — ENOXAPARIN SODIUM 40 MG/0.4ML ~~LOC~~ SOLN
40.0000 mg | SUBCUTANEOUS | Status: DC
  Filled 2015-07-10: qty 0.4

## 2015-07-10 MED ORDER — CARBIDOPA-LEVODOPA 25-100 MG PO TABS
1.0000 | ORAL_TABLET | Freq: Three times a day (TID) | ORAL | Status: DC
  Administered 2015-07-10 – 2015-07-14 (×13): 1 via ORAL
  Filled 2015-07-10 (×14): qty 1

## 2015-07-10 MED ORDER — SODIUM CHLORIDE 0.9% FLUSH
3.0000 mL | Freq: Two times a day (BID) | INTRAVENOUS | Status: DC
  Administered 2015-07-10 – 2015-07-12 (×4): 3 mL via INTRAVENOUS

## 2015-07-10 MED ORDER — INSULIN ASPART 100 UNIT/ML ~~LOC~~ SOLN
0.0000 [IU] | Freq: Three times a day (TID) | SUBCUTANEOUS | Status: DC
  Administered 2015-07-10 (×2): 1 [IU] via SUBCUTANEOUS
  Administered 2015-07-11 (×2): 2 [IU] via SUBCUTANEOUS
  Administered 2015-07-12: 3 [IU] via SUBCUTANEOUS
  Administered 2015-07-12: 2 [IU] via SUBCUTANEOUS
  Administered 2015-07-12: 3 [IU] via SUBCUTANEOUS
  Administered 2015-07-13: 2 [IU] via SUBCUTANEOUS
  Administered 2015-07-14: 1 [IU] via SUBCUTANEOUS

## 2015-07-10 NOTE — Progress Notes (Signed)
Subjective: She was brought in yesterday with healthcare associated pneumonia. She is still very congested.  Objective: Vital signs in last 24 hours: Temp:  [97.5 F (36.4 C)-99.5 F (37.5 C)] 97.5 F (36.4 C) (05/08 0244) Pulse Rate:  [93-99] 94 (05/08 0244) Resp:  [23-36] 30 (05/08 0244) BP: (108-125)/(67-73) 119/67 mmHg (05/08 0244) SpO2:  [90 %-96 %] 92 % (05/08 0832) Weight:  [43.1 kg (95 lb 0.3 oz)-46.72 kg (103 lb)] 43.1 kg (95 lb 0.3 oz) (05/08 0244) Weight change:  Last BM Date: 07/08/15  Intake/Output from previous day: 05/07 0701 - 05/08 0700 In: -  Out: 1100 [Urine:1100]  PHYSICAL EXAM General appearance: alert and Confused Resp: rhonchi bilaterally Cardio: regular rate and rhythm, S1, S2 normal, no murmur, click, rub or gallop GI: soft, non-tender; bowel sounds normal; no masses,  no organomegaly Extremities: extremities normal, atraumatic, no cyanosis or edema  Lab Results:  Results for orders placed or performed during the hospital encounter of 07/20/2015 (from the past 48 hour(s))  POC occult blood, ED     Status: None   Collection Time: 07/04/2015  9:10 PM  Result Value Ref Range   Fecal Occult Bld NEGATIVE NEGATIVE  CBC with Differential/Platelet     Status: Abnormal   Collection Time: 07/17/2015  9:42 PM  Result Value Ref Range   WBC 4.5 4.0 - 10.5 K/uL   RBC 3.74 (L) 3.87 - 5.11 MIL/uL   Hemoglobin 10.1 (L) 12.0 - 15.0 g/dL   HCT 30.6 (L) 36.0 - 46.0 %   MCV 81.8 78.0 - 100.0 fL   MCH 27.0 26.0 - 34.0 pg   MCHC 33.0 30.0 - 36.0 g/dL   RDW 18.1 (H) 11.5 - 15.5 %   Platelets 212 150 - 400 K/uL   Neutrophils Relative % 83 %   Neutro Abs 3.7 1.7 - 7.7 K/uL   Lymphocytes Relative 10 %   Lymphs Abs 0.4 (L) 0.7 - 4.0 K/uL   Monocytes Relative 7 %   Monocytes Absolute 0.3 0.1 - 1.0 K/uL   Eosinophils Relative 0 %   Eosinophils Absolute 0.0 0.0 - 0.7 K/uL   Basophils Relative 0 %   Basophils Absolute 0.0 0.0 - 0.1 K/uL  Comprehensive metabolic panel      Status: Abnormal   Collection Time: 07/22/2015  9:42 PM  Result Value Ref Range   Sodium 139 135 - 145 mmol/L   Potassium 4.1 3.5 - 5.1 mmol/L   Chloride 105 101 - 111 mmol/L   CO2 21 (L) 22 - 32 mmol/L   Glucose, Bld 149 (H) 65 - 99 mg/dL   BUN 52 (H) 6 - 20 mg/dL   Creatinine, Ser 1.46 (H) 0.44 - 1.00 mg/dL   Calcium 8.1 (L) 8.9 - 10.3 mg/dL   Total Protein 6.1 (L) 6.5 - 8.1 g/dL   Albumin 2.2 (L) 3.5 - 5.0 g/dL   AST 27 15 - 41 U/L   ALT 5 (L) 14 - 54 U/L   Alkaline Phosphatase 132 (H) 38 - 126 U/L   Total Bilirubin 1.0 0.3 - 1.2 mg/dL   GFR calc non Af Amer 32 (L) >60 mL/min   GFR calc Af Amer 37 (L) >60 mL/min    Comment: (NOTE) The eGFR has been calculated using the CKD EPI equation. This calculation has not been validated in all clinical situations. eGFR's persistently <60 mL/min signify possible Chronic Kidney Disease.    Anion gap 13 5 - 15  Troponin I  Status: Abnormal   Collection Time: 07/29/2015  9:42 PM  Result Value Ref Range   Troponin I 0.04 (H) <0.031 ng/mL    Comment:        PERSISTENTLY INCREASED TROPONIN VALUES IN THE RANGE OF 0.04-0.49 ng/mL CAN BE SEEN IN:       -UNSTABLE ANGINA       -CONGESTIVE HEART FAILURE       -MYOCARDITIS       -CHEST TRAUMA       -ARRYHTHMIAS       -LATE PRESENTING MYOCARDIAL INFARCTION       -COPD   CLINICAL FOLLOW-UP RECOMMENDED.   Brain natriuretic peptide     Status: Abnormal   Collection Time: 07/23/2015  9:42 PM  Result Value Ref Range   B Natriuretic Peptide 219.0 (H) 0.0 - 100.0 pg/mL  I-Stat CG4 Lactic Acid, ED     Status: None   Collection Time: 07/30/2015  9:51 PM  Result Value Ref Range   Lactic Acid, Venous 1.33 0.5 - 2.0 mmol/L  Urinalysis, Routine w reflex microscopic (not at [facility])     Status: Abnormal   Collection Time: 07/10/15 12:01 AM  Result Value Ref Range   Color, Urine YELLOW YELLOW   APPearance CLEAR CLEAR   Specific Gravity, Urine 1.020 1.005 - 1.030   pH 5.0 5.0 - 8.0   Glucose, UA NEGATIVE  NEGATIVE mg/dL   Hgb urine dipstick MODERATE (A) NEGATIVE   Bilirubin Urine NEGATIVE NEGATIVE   Ketones, ur TRACE (A) NEGATIVE mg/dL   Protein, ur 30 (A) NEGATIVE mg/dL   Nitrite NEGATIVE NEGATIVE   Leukocytes, UA NEGATIVE NEGATIVE  Urine microscopic-add on     Status: Abnormal   Collection Time: 07/10/15 12:01 AM  Result Value Ref Range   Squamous Epithelial / LPF 0-5 (A) NONE SEEN   WBC, UA 0-5 0 - 5 WBC/hpf   RBC / HPF 6-30 0 - 5 RBC/hpf   Bacteria, UA MANY (A) NONE SEEN  Culture, blood (Routine X 2) w Reflex to ID Panel     Status: None (Preliminary result)   Collection Time: 07/10/15 12:07 AM  Result Value Ref Range   Specimen Description RIGHT ANTECUBITAL    Special Requests BOTTLES DRAWN AEROBIC AND ANAEROBIC 6CC    Culture PENDING    Report Status PENDING   Troponin I     Status: Abnormal   Collection Time: 07/10/15 12:07 AM  Result Value Ref Range   Troponin I 0.04 (H) <0.031 ng/mL    Comment:        PERSISTENTLY INCREASED TROPONIN VALUES IN THE RANGE OF 0.04-0.49 ng/mL CAN BE SEEN IN:       -UNSTABLE ANGINA       -CONGESTIVE HEART FAILURE       -MYOCARDITIS       -CHEST TRAUMA       -ARRYHTHMIAS       -LATE PRESENTING MYOCARDIAL INFARCTION       -COPD   CLINICAL FOLLOW-UP RECOMMENDED.   Culture, blood (Routine X 2) w Reflex to ID Panel     Status: None (Preliminary result)   Collection Time: 07/10/15 12:14 AM  Result Value Ref Range   Specimen Description BLOOD RIGHT ARM    Special Requests BOTTLES DRAWN AEROBIC ONLY 5CC    Culture PENDING    Report Status PENDING   Glucose, capillary     Status: Abnormal   Collection Time: 07/10/15  2:35 AM  Result Value Ref Range  Glucose-Capillary 153 (H) 65 - 99 mg/dL  CBC     Status: Abnormal   Collection Time: 07/10/15  5:41 AM  Result Value Ref Range   WBC 4.4 4.0 - 10.5 K/uL   RBC 3.52 (L) 3.87 - 5.11 MIL/uL   Hemoglobin 9.4 (L) 12.0 - 15.0 g/dL   HCT 28.5 (L) 36.0 - 46.0 %   MCV 81.0 78.0 - 100.0 fL   MCH  26.7 26.0 - 34.0 pg   MCHC 33.0 30.0 - 36.0 g/dL   RDW 18.0 (H) 11.5 - 15.5 %   Platelets 235 150 - 400 K/uL  Comprehensive metabolic panel     Status: Abnormal   Collection Time: 07/10/15  5:41 AM  Result Value Ref Range   Sodium 139 135 - 145 mmol/L   Potassium 3.5 3.5 - 5.1 mmol/L   Chloride 103 101 - 111 mmol/L   CO2 23 22 - 32 mmol/L   Glucose, Bld 148 (H) 65 - 99 mg/dL   BUN 53 (H) 6 - 20 mg/dL   Creatinine, Ser 1.55 (H) 0.44 - 1.00 mg/dL   Calcium 7.7 (L) 8.9 - 10.3 mg/dL   Total Protein 5.6 (L) 6.5 - 8.1 g/dL   Albumin 2.0 (L) 3.5 - 5.0 g/dL   AST 28 15 - 41 U/L   ALT 7 (L) 14 - 54 U/L   Alkaline Phosphatase 123 38 - 126 U/L   Total Bilirubin 1.2 0.3 - 1.2 mg/dL   GFR calc non Af Amer 30 (L) >60 mL/min   GFR calc Af Amer 35 (L) >60 mL/min    Comment: (NOTE) The eGFR has been calculated using the CKD EPI equation. This calculation has not been validated in all clinical situations. eGFR's persistently <60 mL/min signify possible Chronic Kidney Disease.    Anion gap 13 5 - 15  Troponin I     Status: Abnormal   Collection Time: 07/10/15  5:41 AM  Result Value Ref Range   Troponin I 0.04 (H) <0.031 ng/mL    Comment:        PERSISTENTLY INCREASED TROPONIN VALUES IN THE RANGE OF 0.04-0.49 ng/mL CAN BE SEEN IN:       -UNSTABLE ANGINA       -CONGESTIVE HEART FAILURE       -MYOCARDITIS       -CHEST TRAUMA       -ARRYHTHMIAS       -LATE PRESENTING MYOCARDIAL INFARCTION       -COPD   CLINICAL FOLLOW-UP RECOMMENDED.   Glucose, capillary     Status: Abnormal   Collection Time: 07/10/15  7:22 AM  Result Value Ref Range   Glucose-Capillary 126 (H) 65 - 99 mg/dL   Comment 1 Notify RN    Comment 2 Document in Chart     ABGS No results for input(s): PHART, PO2ART, TCO2, HCO3 in the last 72 hours.  Invalid input(s): PCO2 CULTURES Recent Results (from the past 240 hour(s))  Culture, blood (Routine X 2) w Reflex to ID Panel     Status: None (Preliminary result)    Collection Time: 07/10/15 12:07 AM  Result Value Ref Range Status   Specimen Description RIGHT ANTECUBITAL  Final   Special Requests BOTTLES DRAWN AEROBIC AND ANAEROBIC 6CC  Final   Culture PENDING  Incomplete   Report Status PENDING  Incomplete  Culture, blood (Routine X 2) w Reflex to ID Panel     Status: None (Preliminary result)   Collection Time: 07/10/15 12:14 AM  Result Value Ref Range Status   Specimen Description BLOOD RIGHT ARM  Final   Special Requests BOTTLES DRAWN AEROBIC ONLY 5CC  Final   Culture PENDING  Incomplete   Report Status PENDING  Incomplete   Studies/Results: Dg Chest Portable 1 View  07/22/2015  CLINICAL DATA:  Productive cough for 1 week. EXAM: PORTABLE CHEST 1 VIEW COMPARISON:  06/14/2015 CT 06/23/2014 radiographs FINDINGS: Left lower lung opacity representing atelectasis versus airspace disease noted with small left pleural effusion. There is no evidence of pneumothorax. Cardiomediastinal silhouette is otherwise unchanged. Surgical clips overlying the lower neck/ upper chest noted. IMPRESSION: Left lower lung opacity representing atelectasis and/or pneumonia. Small left pleural effusion. Electronically Signed   By: Margarette Canada M.D.   On: 07/30/2015 21:20    Medications:  Prior to Admission:  Prescriptions prior to admission  Medication Sig Dispense Refill Last Dose  . aspirin EC 81 MG tablet Take 81 mg by mouth daily.   07/08/2015  . carbidopa-levodopa (SINEMET IR) 25-100 MG per tablet Take 1 tablet by mouth 3 (three) times daily.    07/29/2015  . Cyanocobalamin (VITAMIN B 12 PO) Take 500 mcg by mouth daily.   08/02/2015  . dextromethorphan-guaiFENesin (MUCINEX DM) 30-600 MG 12hr tablet Take 1 tablet by mouth 2 (two) times daily as needed for cough.   07/28/2015  . ketorolac (ACULAR) 0.5 % ophthalmic solution Place 1 drop into both eyes 2 (two) times daily.    07/17/2015  . levothyroxine (SYNTHROID, LEVOTHROID) 75 MCG tablet TAKE 1 TABLET BY MOUTH DAILY BEFORE BREAKFAST.  30 tablet 0 07/29/2015  . loperamide (IMODIUM) 2 MG capsule Take 2 mg by mouth as needed for diarrhea or loose stools.   07/18/2015  . meclizine (ANTIVERT) 25 MG tablet Take 25 mg by mouth 3 (three) times daily as needed for dizziness.    07/25/2015  . QUEtiapine (SEROQUEL) 25 MG tablet Take 2 tablets by mouth at bedtime.   07/08/2015  . rivastigmine (EXELON) 1.5 MG capsule Take 1.5 mg by mouth at bedtime.    07/08/2015  . rosuvastatin (CRESTOR) 20 MG tablet Take 20 mg by mouth every evening.    07/08/2015  . TRADJENTA 5 MG TABS tablet TAKE (1) TABLET BY MOUTH EACH MORNING. 30 tablet 2 07/20/2015  . ondansetron (ZOFRAN ODT) 8 MG disintegrating tablet Take 0.5 tablets (4 mg total) by mouth every 8 (eight) hours as needed for nausea or vomiting. 20 tablet 0 06/26/2015 at morning   Scheduled: . aspirin EC  81 mg Oral Daily  . aztreonam  1 g Intravenous Q12H  . carbidopa-levodopa  1 tablet Oral TID  . enoxaparin (LOVENOX) injection  30 mg Subcutaneous Q24H  . feeding supplement  1 Container Oral TID BM  . guaiFENesin  1,200 mg Oral BID  . insulin aspart  0-9 Units Subcutaneous TID WC  . ipratropium-albuterol  3 mL Nebulization Q4H  . levothyroxine  75 mcg Oral QAC breakfast  . methylPREDNISolone (SOLU-MEDROL) injection  40 mg Intravenous Q12H  . QUEtiapine  50 mg Oral QHS  . rivastigmine  1.5 mg Oral QHS  . rosuvastatin  20 mg Oral QPM  . sodium chloride flush  3 mL Intravenous Q12H  . vancomycin  500 mg Intravenous Q24H   Continuous: . sodium chloride 10 mL/hr at 07/10/15 0246   TIW:PYKDXI chloride, albuterol, meclizine, ondansetron **OR** ondansetron (ZOFRAN) IV, sodium chloride flush  Assesment: She is admitted with healthcare associated pneumonia. She is listed as observation but she needs to  be a full admission. She is on IV antibiotics now on IV steroids. She has multiple other medical problems including what appears to be failure to thrive. She has Parkinson's disease and Lewy body dementia. She  has a history of lung and thyroid cancer. Active Problems:   Diabetes mellitus without complication ([place])   THYROID CANCER, HX OF   TUBERCULOSIS, HX OF   Parkinson's disease ([place])   HCAP (healthcare-associated pneumonia)    Plan: I attempted flutter valve but as noted in the respiratory therapy note she is unable to do this. Continue other treatments.    LOS: 1 day   Rodert Hinch L 07/10/2015, 8:48 AM

## 2015-07-10 NOTE — Progress Notes (Signed)
[facility] for Vancomycin and Aztreonam Indication: Pneumonia  Allergies  Allergen Reactions  . Amoxicillin Nausea Only  . Statins Nausea And Vomiting  . Sulfa Antibiotics Itching  . Sulfonamide Derivatives Nausea And Vomiting   Patient Measurements: Height: '5\' 2"'$  (157.5 cm) Weight: 95 lb 0.3 oz (43.1 kg) IBW/kg (Calculated) : 50.1  Vital Signs: Temp: 97.5 F (36.4 C) (05/08 0244) Temp Source: Oral (05/08 0244) BP: 119/67 mmHg (05/08 0244) Pulse Rate: 94 (05/08 0244)  Labs:  Recent Labs  07/30/2015 2142 07/10/15 0541  WBC 4.5 4.4  HGB 10.1* 9.4*  PLT 212 235  CREATININE 1.46* 1.55*   Estimated Creatinine Clearance: 19 mL/min (by C-G formula based on Cr of 1.55).  No results for input(s): VANCOTROUGH, VANCOPEAK, VANCORANDOM, GENTTROUGH, GENTPEAK, GENTRANDOM, TOBRATROUGH, TOBRAPEAK, TOBRARND, AMIKACINPEAK, AMIKACINTROU, AMIKACIN in the last 72 hours.   Microbiology: Recent Results (from the past 720 hour(s))  Urine culture     Status: Abnormal   Collection Time: 06/19/15 10:54 AM  Result Value Ref Range Status   Specimen Description URINE, CLEAN CATCH  Final   Special Requests Normal  Final   Culture (A)  Final    >=100,000 COLONIES/mL AEROCOCCUS URINAE Standardized susceptibility testing for this organism is not available. Performed at [facility]    Report Status 06/22/2015 FINAL  Final  Culture, blood (Routine X 2) w Reflex to ID Panel     Status: None (Preliminary result)   Collection Time: 07/10/15 12:07 AM  Result Value Ref Range Status   Specimen Description RIGHT ANTECUBITAL  Final   Special Requests BOTTLES DRAWN AEROBIC AND ANAEROBIC 6CC  Final   Culture NO GROWTH < 12 HOURS  Final   Report Status PENDING  Incomplete  Culture, blood (Routine X 2) w Reflex to ID Panel     Status: None (Preliminary result)   Collection Time: 07/10/15 12:14 AM  Result Value Ref Range Status   Specimen Description BLOOD RIGHT ARM   Final   Special Requests BOTTLES DRAWN AEROBIC ONLY 5CC  Final   Culture NO GROWTH < 12 HOURS  Final   Report Status PENDING  Incomplete   Medical History: Past Medical History  Diagnosis Date  . Hyperlipidemia   . Osteoporosis   . Pneumonia 5/13  . GERD (gastroesophageal reflux disease)   . History of migraine headaches   . Vertigo   . History of tuberculosis 1965  . Thyroid disease 2008    thyroid cancer cured with surgery  . Hypertension   . Diabetes mellitus     controlled with medication  . Parkinson's disease ([place])   . CA - cancer     thyroid - 2008 surg  . Lung cancer ([place]) 09/04/2011    non-small cell ca in L upper lobe  . Cancer ([place])     cervical ca - 2005 - surg and radiation  . Hypothyroidism   . Thyroid disease   . Diabetes mellitus without complication ([place])   . Parkinson disease ([place])   . Dementia   . Tuberculosis    Anti-infectives    Start     Dose/Rate Route Frequency Ordered Stop   07/10/15 1800  vancomycin (VANCOCIN) 500 mg in sodium chloride 0.9 % 100 mL IVPB     500 mg 100 mL/hr over 60 Minutes Intravenous Every 24 hours 07/10/15 0756     07/10/15 1000  aztreonam (AZACTAM) 1 g in dextrose 5 % 50 mL IVPB     1  g 100 mL/hr over 30 Minutes Intravenous Every 12 hours 07/10/15 0755     07/10/15 0000  aztreonam (AZACTAM) 1 g in dextrose 5 % 50 mL IVPB     1 g 100 mL/hr over 30 Minutes Intravenous  Once 07/29/2015 2336 07/10/15 0145   07/15/2015 2345  vancomycin (VANCOCIN) 500 mg in sodium chloride 0.9 % 100 mL IVPB     500 mg 100 mL/hr over 60 Minutes Intravenous  Once 07/16/2015 2336 07/10/15 0107   07/12/2015 2315  levofloxacin (LEVAQUIN) IVPB 500 mg  Status:  Discontinued     500 mg 100 mL/hr over 60 Minutes Intravenous  Once 07/08/2015 2307 07/10/2015 2318     Assessment: 82 yo female with past history sig for lung cancer seen in the ED with ongoing weakness x 2 weeks; chest congestion x 1 week, and 3 day hx of diarrhea, productive cough and difficulty  breathing. Chest x-ray shows new left infiltrate and effusion since prior CT in April. Empiric antibiotics for pneumonia.  SCr elevated. Pt was not fully loaded with Vanc so will start next dose earlier.  Cx's pending.  Goal of Therapy:  Vancomycin troughs 15-20 mcg/ml Eradicate infection  Plan:  Vancomycin '500mg'$  IV q24h Check trough at steady state Aztreonam 1gm IV q12hrs Monitor labs, renal fxn, progress and c/s Deescalate ABX when improved / appropriate.     Hart Robinsons A, RPH 07/10/2015,10:44 AM

## 2015-07-10 NOTE — Progress Notes (Signed)
RT instructed patient on use of flutter valve; however, with several attempts patient is unable to do.

## 2015-07-11 LAB — GLUCOSE, CAPILLARY
GLUCOSE-CAPILLARY: 151 mg/dL — AB (ref 65–99)
GLUCOSE-CAPILLARY: 193 mg/dL — AB (ref 65–99)
Glucose-Capillary: 160 mg/dL — ABNORMAL HIGH (ref 65–99)
Glucose-Capillary: 286 mg/dL — ABNORMAL HIGH (ref 65–99)

## 2015-07-11 LAB — HEMOGLOBIN A1C
Hgb A1c MFr Bld: 6.7 % — ABNORMAL HIGH (ref 4.8–5.6)
Mean Plasma Glucose: 146 mg/dL

## 2015-07-11 MED ORDER — IPRATROPIUM-ALBUTEROL 0.5-2.5 (3) MG/3ML IN SOLN
3.0000 mL | Freq: Four times a day (QID) | RESPIRATORY_TRACT | Status: DC
  Administered 2015-07-11 – 2015-07-14 (×9): 3 mL via RESPIRATORY_TRACT
  Filled 2015-07-11 (×9): qty 3

## 2015-07-11 NOTE — Care Management Important Message (Signed)
Important Message  Patient Details  Name: Krista [name] MRN: [id number] Date of Birth: Oct 11, 1931   Medicare Important Message Given:  Yes    Sherald Barge, RN 07/11/2015, 11:47 AM

## 2015-07-11 NOTE — Progress Notes (Signed)
Subjective: She seems to feel a little better. She had some diarrhea yesterday and stool specimens were requested but she has not had a bowel movement since. She still coughing. She is confused.  Objective: Vital signs in last 24 hours: Temp:  [97.5 F (36.4 C)-98.1 F (36.7 C)] 97.5 F (36.4 C) (05/09 0559) Pulse Rate:  [78-92] 78 (05/09 0559) Resp:  [20-28] 20 (05/09 0559) BP: (107-120)/(50-70) 107/50 mmHg (05/09 0559) SpO2:  [90 %-98 %] 98 % (05/09 0803) Weight change:  Last BM Date: 07/10/15  Intake/Output from previous day: 05/08 0701 - 05/09 0700 In: 320 [P.O.:320] Out: 900 [Urine:900]  PHYSICAL EXAM General appearance: alert and Confused Resp: She still has minimal rhonchi but her chest is much clearer than yesterday Cardio: regular rate and rhythm, S1, S2 normal, no murmur, click, rub or gallop GI: soft, non-tender; bowel sounds normal; no masses,  no organomegaly Extremities: extremities normal, atraumatic, no cyanosis or edema  Lab Results:  Results for orders placed or performed during the hospital encounter of 07/08/2015 (from the past 48 hour(s))  POC occult blood, ED     Status: None   Collection Time: 07/25/2015  9:10 PM  Result Value Ref Range   Fecal Occult Bld NEGATIVE NEGATIVE  CBC with Differential/Platelet     Status: Abnormal   Collection Time: 07/16/2015  9:42 PM  Result Value Ref Range   WBC 4.5 4.0 - 10.5 K/uL   RBC 3.74 (L) 3.87 - 5.11 MIL/uL   Hemoglobin 10.1 (L) 12.0 - 15.0 g/dL   HCT 65.0 (L) 65.2 - 90.9 %   MCV 81.8 78.0 - 100.0 fL   MCH 27.0 26.0 - 34.0 pg   MCHC 33.0 30.0 - 36.0 g/dL   RDW 50.4 (H) 73.9 - 42.6 %   Platelets 212 150 - 400 K/uL   Neutrophils Relative % 83 %   Neutro Abs 3.7 1.7 - 7.7 K/uL   Lymphocytes Relative 10 %   Lymphs Abs 0.4 (L) 0.7 - 4.0 K/uL   Monocytes Relative 7 %   Monocytes Absolute 0.3 0.1 - 1.0 K/uL   Eosinophils Relative 0 %   Eosinophils Absolute 0.0 0.0 - 0.7 K/uL   Basophils Relative 0 %   Basophils  Absolute 0.0 0.0 - 0.1 K/uL  Comprehensive metabolic panel     Status: Abnormal   Collection Time: 07/11/2015  9:42 PM  Result Value Ref Range   Sodium 139 135 - 145 mmol/L   Potassium 4.1 3.5 - 5.1 mmol/L   Chloride 105 101 - 111 mmol/L   CO2 21 (L) 22 - 32 mmol/L   Glucose, Bld 149 (H) 65 - 99 mg/dL   BUN 52 (H) 6 - 20 mg/dL   Creatinine, Ser 1.03 (H) 0.44 - 1.00 mg/dL   Calcium 8.1 (L) 8.9 - 10.3 mg/dL   Total Protein 6.1 (L) 6.5 - 8.1 g/dL   Albumin 2.2 (L) 3.5 - 5.0 g/dL   AST 27 15 - 41 U/L   ALT 5 (L) 14 - 54 U/L   Alkaline Phosphatase 132 (H) 38 - 126 U/L   Total Bilirubin 1.0 0.3 - 1.2 mg/dL   GFR calc non Af Amer 32 (L) >60 mL/min   GFR calc Af Amer 37 (L) >60 mL/min    Comment: (NOTE) The eGFR has been calculated using the CKD EPI equation. This calculation has not been validated in all clinical situations. eGFR's persistently <60 mL/min signify possible Chronic Kidney Disease.    Anion gap  13 5 - 15  Troponin I     Status: Abnormal   Collection Time: 07/07/2015  9:42 PM  Result Value Ref Range   Troponin I 0.04 (H) <0.031 ng/mL    Comment:        PERSISTENTLY INCREASED TROPONIN VALUES IN THE RANGE OF 0.04-0.49 ng/mL CAN BE SEEN IN:       -UNSTABLE ANGINA       -CONGESTIVE HEART FAILURE       -MYOCARDITIS       -CHEST TRAUMA       -ARRYHTHMIAS       -LATE PRESENTING MYOCARDIAL INFARCTION       -COPD   CLINICAL FOLLOW-UP RECOMMENDED.   Brain natriuretic peptide     Status: Abnormal   Collection Time: 07/21/2015  9:42 PM  Result Value Ref Range   B Natriuretic Peptide 219.0 (H) 0.0 - 100.0 pg/mL  I-Stat CG4 Lactic Acid, ED     Status: None   Collection Time: 07/30/2015  9:51 PM  Result Value Ref Range   Lactic Acid, Venous 1.33 0.5 - 2.0 mmol/L  Urinalysis, Routine w reflex microscopic (not at [facility])     Status: Abnormal   Collection Time: 07/10/15 12:01 AM  Result Value Ref Range   Color, Urine YELLOW YELLOW   APPearance CLEAR CLEAR   Specific Gravity, Urine  1.020 1.005 - 1.030   pH 5.0 5.0 - 8.0   Glucose, UA NEGATIVE NEGATIVE mg/dL   Hgb urine dipstick MODERATE (A) NEGATIVE   Bilirubin Urine NEGATIVE NEGATIVE   Ketones, ur TRACE (A) NEGATIVE mg/dL   Protein, ur 30 (A) NEGATIVE mg/dL   Nitrite NEGATIVE NEGATIVE   Leukocytes, UA NEGATIVE NEGATIVE  Urine microscopic-add on     Status: Abnormal   Collection Time: 07/10/15 12:01 AM  Result Value Ref Range   Squamous Epithelial / LPF 0-5 (A) NONE SEEN   WBC, UA 0-5 0 - 5 WBC/hpf   RBC / HPF 6-30 0 - 5 RBC/hpf   Bacteria, UA MANY (A) NONE SEEN  Culture, blood (Routine X 2) w Reflex to ID Panel     Status: None (Preliminary result)   Collection Time: 07/10/15 12:07 AM  Result Value Ref Range   Specimen Description RIGHT ANTECUBITAL    Special Requests BOTTLES DRAWN AEROBIC AND ANAEROBIC 6CC    Culture NO GROWTH < 12 HOURS    Report Status PENDING   Troponin I     Status: Abnormal   Collection Time: 07/10/15 12:07 AM  Result Value Ref Range   Troponin I 0.04 (H) <0.031 ng/mL    Comment:        PERSISTENTLY INCREASED TROPONIN VALUES IN THE RANGE OF 0.04-0.49 ng/mL CAN BE SEEN IN:       -UNSTABLE ANGINA       -CONGESTIVE HEART FAILURE       -MYOCARDITIS       -CHEST TRAUMA       -ARRYHTHMIAS       -LATE PRESENTING MYOCARDIAL INFARCTION       -COPD   CLINICAL FOLLOW-UP RECOMMENDED.   Culture, blood (Routine X 2) w Reflex to ID Panel     Status: None (Preliminary result)   Collection Time: 07/10/15 12:14 AM  Result Value Ref Range   Specimen Description BLOOD RIGHT ARM    Special Requests BOTTLES DRAWN AEROBIC ONLY 5CC    Culture NO GROWTH < 12 HOURS    Report Status PENDING   Glucose, capillary  Status: Abnormal   Collection Time: 07/10/15  2:35 AM  Result Value Ref Range   Glucose-Capillary 153 (H) 65 - 99 mg/dL  Strep pneumoniae urinary antigen     Status: None   Collection Time: 07/10/15  4:39 AM  Result Value Ref Range   Strep Pneumo Urinary Antigen NEGATIVE NEGATIVE     Comment:        Infection due to S. pneumoniae cannot be absolutely ruled out since the antigen present may be below the detection limit of the test. Performed at [facility]   Hemoglobin A1c     Status: Abnormal   Collection Time: 07/10/15  5:41 AM  Result Value Ref Range   Hgb A1c MFr Bld 6.7 (H) 4.8 - 5.6 %    Comment: (NOTE)         Pre-diabetes: 5.7 - 6.4         Diabetes: >6.4         Glycemic control for adults with diabetes: <7.0    Mean Plasma Glucose 146 mg/dL    Comment: (NOTE) Performed At: [facility] [place], [place] [id number] Lindon Romp MD [id number]   CBC     Status: Abnormal   Collection Time: 07/10/15  5:41 AM  Result Value Ref Range   WBC 4.4 4.0 - 10.5 K/uL   RBC 3.52 (L) 3.87 - 5.11 MIL/uL   Hemoglobin 9.4 (L) 12.0 - 15.0 g/dL   HCT 28.5 (L) 36.0 - 46.0 %   MCV 81.0 78.0 - 100.0 fL   MCH 26.7 26.0 - 34.0 pg   MCHC 33.0 30.0 - 36.0 g/dL   RDW 18.0 (H) 11.5 - 15.5 %   Platelets 235 150 - 400 K/uL  Comprehensive metabolic panel     Status: Abnormal   Collection Time: 07/10/15  5:41 AM  Result Value Ref Range   Sodium 139 135 - 145 mmol/L   Potassium 3.5 3.5 - 5.1 mmol/L   Chloride 103 101 - 111 mmol/L   CO2 23 22 - 32 mmol/L   Glucose, Bld 148 (H) 65 - 99 mg/dL   BUN 53 (H) 6 - 20 mg/dL   Creatinine, Ser 1.55 (H) 0.44 - 1.00 mg/dL   Calcium 7.7 (L) 8.9 - 10.3 mg/dL   Total Protein 5.6 (L) 6.5 - 8.1 g/dL   Albumin 2.0 (L) 3.5 - 5.0 g/dL   AST 28 15 - 41 U/L   ALT 7 (L) 14 - 54 U/L   Alkaline Phosphatase 123 38 - 126 U/L   Total Bilirubin 1.2 0.3 - 1.2 mg/dL   GFR calc non Af Amer 30 (L) >60 mL/min   GFR calc Af Amer 35 (L) >60 mL/min    Comment: (NOTE) The eGFR has been calculated using the CKD EPI equation. This calculation has not been validated in all clinical situations. eGFR's persistently <60 mL/min signify possible Chronic Kidney Disease.    Anion gap 13 5 - 15  Troponin I     Status:  Abnormal   Collection Time: 07/10/15  5:41 AM  Result Value Ref Range   Troponin I 0.04 (H) <0.031 ng/mL    Comment:        PERSISTENTLY INCREASED TROPONIN VALUES IN THE RANGE OF 0.04-0.49 ng/mL CAN BE SEEN IN:       -UNSTABLE ANGINA       -CONGESTIVE HEART FAILURE       -MYOCARDITIS       -CHEST TRAUMA       -  ARRYHTHMIAS       -LATE PRESENTING MYOCARDIAL INFARCTION       -COPD   CLINICAL FOLLOW-UP RECOMMENDED.   Glucose, capillary     Status: Abnormal   Collection Time: 07/10/15  7:22 AM  Result Value Ref Range   Glucose-Capillary 126 (H) 65 - 99 mg/dL   Comment 1 Notify RN    Comment 2 Document in Chart   Glucose, capillary     Status: Abnormal   Collection Time: 07/10/15 11:08 AM  Result Value Ref Range   Glucose-Capillary 172 (H) 65 - 99 mg/dL   Comment 1 Notify RN    Comment 2 Document in Chart   Troponin I     Status: Abnormal   Collection Time: 07/10/15  2:25 PM  Result Value Ref Range   Troponin I 0.04 (H) <0.031 ng/mL    Comment:        PERSISTENTLY INCREASED TROPONIN VALUES IN THE RANGE OF 0.04-0.49 ng/mL CAN BE SEEN IN:       -UNSTABLE ANGINA       -CONGESTIVE HEART FAILURE       -MYOCARDITIS       -CHEST TRAUMA       -ARRYHTHMIAS       -LATE PRESENTING MYOCARDIAL INFARCTION       -COPD   CLINICAL FOLLOW-UP RECOMMENDED.   Glucose, capillary     Status: Abnormal   Collection Time: 07/10/15  4:26 PM  Result Value Ref Range   Glucose-Capillary 149 (H) 65 - 99 mg/dL   Comment 1 Notify RN   Glucose, capillary     Status: None   Collection Time: 07/10/15  8:45 PM  Result Value Ref Range   Glucose-Capillary 90 65 - 99 mg/dL   Comment 1 Notify RN    Comment 2 Document in Chart   Glucose, capillary     Status: Abnormal   Collection Time: 07/11/15  7:17 AM  Result Value Ref Range   Glucose-Capillary 151 (H) 65 - 99 mg/dL   Comment 1 Notify RN    Comment 2 Document in Chart     ABGS No results for input(s): PHART, PO2ART, TCO2, HCO3 in the last 72  hours.  Invalid input(s): PCO2 CULTURES Recent Results (from the past 240 hour(s))  Culture, blood (Routine X 2) w Reflex to ID Panel     Status: None (Preliminary result)   Collection Time: 07/10/15 12:07 AM  Result Value Ref Range Status   Specimen Description RIGHT ANTECUBITAL  Final   Special Requests BOTTLES DRAWN AEROBIC AND ANAEROBIC 6CC  Final   Culture NO GROWTH < 12 HOURS  Final   Report Status PENDING  Incomplete  Culture, blood (Routine X 2) w Reflex to ID Panel     Status: None (Preliminary result)   Collection Time: 07/10/15 12:14 AM  Result Value Ref Range Status   Specimen Description BLOOD RIGHT ARM  Final   Special Requests BOTTLES DRAWN AEROBIC ONLY 5CC  Final   Culture NO GROWTH < 12 HOURS  Final   Report Status PENDING  Incomplete   Studies/Results: Dg Chest Portable 1 View  07/20/2015  CLINICAL DATA:  Productive cough for 1 week. EXAM: PORTABLE CHEST 1 VIEW COMPARISON:  06/14/2015 CT 06/23/2014 radiographs FINDINGS: Left lower lung opacity representing atelectasis versus airspace disease noted with small left pleural effusion. There is no evidence of pneumothorax. Cardiomediastinal silhouette is otherwise unchanged. Surgical clips overlying the lower neck/ upper chest noted. IMPRESSION: Left lower lung opacity  representing atelectasis and/or pneumonia. Small left pleural effusion. Electronically Signed   By: Margarette Canada M.D.   On: 07/15/2015 21:20    Medications:  Prior to Admission:  Prescriptions prior to admission  Medication Sig Dispense Refill Last Dose  . aspirin EC 81 MG tablet Take 81 mg by mouth daily.   08/02/2015  . carbidopa-levodopa (SINEMET IR) 25-100 MG per tablet Take 1 tablet by mouth 3 (three) times daily.    07/06/2015  . Cyanocobalamin (VITAMIN B 12 PO) Take 500 mcg by mouth daily.   07/27/2015  . dextromethorphan-guaiFENesin (MUCINEX DM) 30-600 MG 12hr tablet Take 1 tablet by mouth 2 (two) times daily as needed for cough.   07/29/2015  . ketorolac  (ACULAR) 0.5 % ophthalmic solution Place 1 drop into both eyes 2 (two) times daily.    08/01/2015  . levothyroxine (SYNTHROID, LEVOTHROID) 75 MCG tablet TAKE 1 TABLET BY MOUTH DAILY BEFORE BREAKFAST. 30 tablet 0 07/12/2015  . loperamide (IMODIUM) 2 MG capsule Take 2 mg by mouth as needed for diarrhea or loose stools.   07/03/2015  . meclizine (ANTIVERT) 25 MG tablet Take 25 mg by mouth 3 (three) times daily as needed for dizziness.    07/10/2015  . QUEtiapine (SEROQUEL) 25 MG tablet Take 2 tablets by mouth at bedtime.   07/08/2015  . rivastigmine (EXELON) 1.5 MG capsule Take 1.5 mg by mouth at bedtime.    07/08/2015  . rosuvastatin (CRESTOR) 20 MG tablet Take 20 mg by mouth every evening.    07/08/2015  . TRADJENTA 5 MG TABS tablet TAKE (1) TABLET BY MOUTH EACH MORNING. 30 tablet 2 07/04/2015  . ondansetron (ZOFRAN ODT) 8 MG disintegrating tablet Take 0.5 tablets (4 mg total) by mouth every 8 (eight) hours as needed for nausea or vomiting. 20 tablet 0 06/26/2015 at morning   Scheduled: . aspirin EC  81 mg Oral Daily  . aztreonam  1 g Intravenous Q12H  . carbidopa-levodopa  1 tablet Oral TID  . enoxaparin (LOVENOX) injection  30 mg Subcutaneous Q24H  . feeding supplement  1 Container Oral TID BM  . guaiFENesin  1,200 mg Oral BID  . insulin aspart  0-9 Units Subcutaneous TID WC  . ipratropium-albuterol  3 mL Nebulization Q4H  . levothyroxine  75 mcg Oral QAC breakfast  . methylPREDNISolone (SOLU-MEDROL) injection  40 mg Intravenous Q12H  . QUEtiapine  50 mg Oral QHS  . rivastigmine  1.5 mg Oral QHS  . rosuvastatin  20 mg Oral QPM  . sodium chloride flush  3 mL Intravenous Q12H  . vancomycin  500 mg Intravenous Q24H   Continuous: . sodium chloride 10 mL/hr at 07/10/15 0246   TKP:[id number] chloride, albuterol, meclizine, ondansetron **OR** ondansetron (ZOFRAN) IV, sodium chloride flush  Assesment: She is admitted with healthcare associated pneumonia. She had diarrhea but that has resolved so I'm going to  cancel checking for C. difficile. Her chest is clearly better.  She has dementia from Lewy bodies related to Parkinson's disease and she's a little more confused  Her nutritional status is not very good and yesterday she refused to eat and would not drink boost because she was arguing with her family about whether that was actually boost or not  She has diabetes. This is pretty well controlled.  She has a history of lung cancer and of thyroid cancer stable Active Problems:   Diabetes mellitus without complication (HCC)   THYROID CANCER, HX OF   TUBERCULOSIS, HX OF   Parkinson's  disease ([place])   HCAP (healthcare-associated pneumonia)    Plan: Continue IV antibiotics for now. She probably needs at least 24 more hours of IV and perhaps 48. After that she can be transitioned to by mouth with plans for discharge home    LOS: 2 days   [place] L 07/11/2015, 8:42 AM

## 2015-07-11 NOTE — Progress Notes (Signed)
Pt.'s rhythm changed to A Fib. Dr. Luan Pulling notified.

## 2015-07-11 NOTE — Care Management Note (Signed)
Case Management Note  Patient Details  Name: Krista [name] MRN: [id number] Date of Birth: 07-23-1931  Subjective/Objective:                  Pt admitted with HCAP. Pt is from home, lives with family, has strong support. Pt has home [place] services through [facility]. Abby, of [facility], is aware of admission and will obtain pt info from chart. Pt's daughter is at the bedside. Pt/family plans to resume services through [facility]. No DME needs at this time.   Action/Plan: Will cont to follow for DC planning.   Expected Discharge Date:  07/03/2015               Expected Discharge Plan:  [place]  In-House Referral:  NA  Discharge planning Services  CM Consult  Post Acute Care Choice:  Resumption of Svcs/PTA Provider, Home Health Choice offered to:  Patient, Adult Children  DME Arranged:    DME Agency:     HH Arranged:  RN, PT [place] Agency:  [facility]  Status of Service:  In process, will continue to follow  Medicare Important Message Given:  Yes Date Medicare IM Given:    Medicare IM give by:    Date Additional Medicare IM Given:    Additional Medicare Important Message give by:     If discussed at [place] of Stay Meetings, dates discussed:    Additional Comments:  Sherald Barge, RN 07/11/2015, 3:06 PM

## 2015-07-12 DIAGNOSIS — R110 Nausea: Secondary | ICD-10-CM

## 2015-07-12 DIAGNOSIS — R627 Adult failure to thrive: Secondary | ICD-10-CM

## 2015-07-12 DIAGNOSIS — D649 Anemia, unspecified: Secondary | ICD-10-CM

## 2015-07-12 LAB — GLUCOSE, CAPILLARY
GLUCOSE-CAPILLARY: 220 mg/dL — AB (ref 65–99)
GLUCOSE-CAPILLARY: 197 mg/dL — AB (ref 65–99)
GLUCOSE-CAPILLARY: 106 mg/dL — AB (ref 65–99)
Glucose-Capillary: 216 mg/dL — ABNORMAL HIGH (ref 65–99)

## 2015-07-12 LAB — URINE CULTURE: CULTURE: NO GROWTH

## 2015-07-12 MED ORDER — PHENOL 1.4 % MT LIQD
1.0000 | OROMUCOSAL | Status: DC | PRN
  Filled 2015-07-12: qty 177

## 2015-07-12 MED ORDER — ACETAMINOPHEN 325 MG PO TABS
650.0000 mg | ORAL_TABLET | ORAL | Status: DC | PRN
  Administered 2015-07-12: 650 mg via ORAL
  Filled 2015-07-12 (×2): qty 2

## 2015-07-12 MED ORDER — MENTHOL 3 MG MT LOZG: 1.0000 | LOZENGE | OROMUCOSAL | Status: DC | PRN

## 2015-07-12 MED ORDER — CEPASTAT 14.5 MG MT LOZG
1.0000 | LOZENGE | OROMUCOSAL | Status: DC | PRN
  Filled 2015-07-12: qty 9

## 2015-07-12 MED ORDER — SODIUM CHLORIDE 0.9 % IV SOLN: INTRAVENOUS | Status: DC

## 2015-07-12 NOTE — Progress Notes (Signed)
Inpatient Diabetes Program Recommendations  AACE/ADA: New Consensus Statement on Inpatient Glycemic Control (2015)  Target Ranges:  Prepandial:   less than 140 mg/dL      Peak postprandial:   less than 180 mg/dL (1-2 hours)      Critically ill patients:  140 - 180 mg/dL   Results for Krista [name], FANTON (MRN [id number]) as of 07/12/2015 09:54  Ref. Range 07/11/2015 07:17 07/11/2015 11:58 07/11/2015 16:18 07/11/2015 22:19 07/12/2015 07:23  Glucose-Capillary Latest Ref Range: 65-99 mg/dL 151 (H) 193 (H) 160 (H) 286 (H) 220 (H)   Review of Glycemic Control  Diabetes history: DM 2 Outpatient Diabetes medications: Tradjenta 5 mg Daily Current orders for Inpatient glycemic control: Novolog Sensitive TID  Inpatient Diabetes Program Recommendations:   Patient is on IV Solumedrol 40 mg Q 12hrs, Glucose trend in the 200's, May want to consider increasing correction scale to Novolog Moderate (0-15 units) TID+ HS scale.  Thanks,  Tama Headings RN, MSN, [facility] Inpatient Diabetes Coordinator Team Pager [phone] (8a-5p)

## 2015-07-12 NOTE — Progress Notes (Signed)
Subjective: She is still coughing up a lot of brown sputum. She's not been eating very well. She has a sore throat.  Objective: Vital signs in last 24 hours: Temp:  [97.9 F (36.6 C)-98.4 F (36.9 C)] 98.4 F (36.9 C) (05/10 0645) Pulse Rate:  [78-99] 89 (05/10 0645) Resp:  [17-20] 20 (05/10 0645) BP: (114-118)/(55-68) 118/64 mmHg (05/10 0645) SpO2:  [95 %-100 %] 97 % (05/10 0739) Weight change:  Last BM Date: 07/11/15  Intake/Output from previous day: 05/09 0701 - 05/10 0700 In: 480 [P.O.:480] Out: -   PHYSICAL EXAM General appearance: alert and mild distress Resp: rhonchi bilaterally Cardio: regular rate and rhythm, S1, S2 normal, no murmur, click, rub or gallop GI: soft, non-tender; bowel sounds normal; no masses,  no organomegaly Extremities: extremities normal, atraumatic, no cyanosis or edema  Lab Results:  Results for orders placed or performed during the hospital encounter of 07/25/2015 (from the past 48 hour(s))  Glucose, capillary     Status: Abnormal   Collection Time: 07/10/15 11:08 AM  Result Value Ref Range   Glucose-Capillary 172 (H) 65 - 99 mg/dL   Comment 1 Notify RN    Comment 2 Document in Chart   Troponin I     Status: Abnormal   Collection Time: 07/10/15  2:25 PM  Result Value Ref Range   Troponin I 0.04 (H) <0.031 ng/mL    Comment:        PERSISTENTLY INCREASED TROPONIN VALUES IN THE RANGE OF 0.04-0.49 ng/mL CAN BE SEEN IN:       -UNSTABLE ANGINA       -CONGESTIVE HEART FAILURE       -MYOCARDITIS       -CHEST TRAUMA       -ARRYHTHMIAS       -LATE PRESENTING MYOCARDIAL INFARCTION       -COPD   CLINICAL FOLLOW-UP RECOMMENDED.   Glucose, capillary     Status: Abnormal   Collection Time: 07/10/15  4:26 PM  Result Value Ref Range   Glucose-Capillary 149 (H) 65 - 99 mg/dL   Comment 1 Notify RN   Glucose, capillary     Status: None   Collection Time: 07/10/15  8:45 PM  Result Value Ref Range   Glucose-Capillary 90 65 - 99 mg/dL   Comment 1  Notify RN    Comment 2 Document in Chart   Glucose, capillary     Status: Abnormal   Collection Time: 07/11/15  7:17 AM  Result Value Ref Range   Glucose-Capillary 151 (H) 65 - 99 mg/dL   Comment 1 Notify RN    Comment 2 Document in Chart   Glucose, capillary     Status: Abnormal   Collection Time: 07/11/15 11:58 AM  Result Value Ref Range   Glucose-Capillary 193 (H) 65 - 99 mg/dL   Comment 1 Notify RN    Comment 2 Document in Chart   Glucose, capillary     Status: Abnormal   Collection Time: 07/11/15  4:18 PM  Result Value Ref Range   Glucose-Capillary 160 (H) 65 - 99 mg/dL   Comment 1 Notify RN    Comment 2 Document in Chart   Glucose, capillary     Status: Abnormal   Collection Time: 07/11/15 10:19 PM  Result Value Ref Range   Glucose-Capillary 286 (H) 65 - 99 mg/dL   Comment 1 Notify RN    Comment 2 Document in Chart   Glucose, capillary     Status: Abnormal  Collection Time: 07/12/15  7:23 AM  Result Value Ref Range   Glucose-Capillary 220 (H) 65 - 99 mg/dL   Comment 1 Notify RN    Comment 2 Document in Chart     ABGS No results for input(s): PHART, PO2ART, TCO2, HCO3 in the last 72 hours.  Invalid input(s): PCO2 CULTURES Recent Results (from the past 240 hour(s))  Urine culture     Status: None   Collection Time: 07/10/15 12:01 AM  Result Value Ref Range Status   Specimen Description URINE, CLEAN CATCH  Final   Special Requests NONE  Final   Culture   Final    NO GROWTH 2 DAYS Performed at [facility]    Report Status 07/12/2015 FINAL  Final  Culture, blood (Routine X 2) w Reflex to ID Panel     Status: None (Preliminary result)   Collection Time: 07/10/15 12:07 AM  Result Value Ref Range Status   Specimen Description BLOOD RIGHT ANTECUBITAL  Final   Special Requests BOTTLES DRAWN AEROBIC AND ANAEROBIC 6CC  Final   Culture NO GROWTH 1 DAY  Final   Report Status PENDING  Incomplete  Culture, blood (Routine X 2) w Reflex to ID Panel     Status:  None (Preliminary result)   Collection Time: 07/10/15 12:14 AM  Result Value Ref Range Status   Specimen Description BLOOD RIGHT ARM  Final   Special Requests BOTTLES DRAWN AEROBIC ONLY 5CC  Final   Culture NO GROWTH 1 DAY  Final   Report Status PENDING  Incomplete   Studies/Results: No results found.  Medications:  Prior to Admission:  Prescriptions prior to admission  Medication Sig Dispense Refill Last Dose  . aspirin EC 81 MG tablet Take 81 mg by mouth daily.   07/07/2015  . carbidopa-levodopa (SINEMET IR) 25-100 MG per tablet Take 1 tablet by mouth 3 (three) times daily.    07/12/2015  . Cyanocobalamin (VITAMIN B 12 PO) Take 500 mcg by mouth daily.   07/21/2015  . dextromethorphan-guaiFENesin (MUCINEX DM) 30-600 MG 12hr tablet Take 1 tablet by mouth 2 (two) times daily as needed for cough.   08/01/2015  . ketorolac (ACULAR) 0.5 % ophthalmic solution Place 1 drop into both eyes 2 (two) times daily.    07/18/2015  . levothyroxine (SYNTHROID, LEVOTHROID) 75 MCG tablet TAKE 1 TABLET BY MOUTH DAILY BEFORE BREAKFAST. 30 tablet 0 07/24/2015  . loperamide (IMODIUM) 2 MG capsule Take 2 mg by mouth as needed for diarrhea or loose stools.   07/08/2015  . meclizine (ANTIVERT) 25 MG tablet Take 25 mg by mouth 3 (three) times daily as needed for dizziness.    07/07/2015  . QUEtiapine (SEROQUEL) 25 MG tablet Take 2 tablets by mouth at bedtime.   07/08/2015  . rivastigmine (EXELON) 1.5 MG capsule Take 1.5 mg by mouth at bedtime.    07/08/2015  . rosuvastatin (CRESTOR) 20 MG tablet Take 20 mg by mouth every evening.    07/08/2015  . TRADJENTA 5 MG TABS tablet TAKE (1) TABLET BY MOUTH EACH MORNING. 30 tablet 2 07/17/2015  . ondansetron (ZOFRAN ODT) 8 MG disintegrating tablet Take 0.5 tablets (4 mg total) by mouth every 8 (eight) hours as needed for nausea or vomiting. 20 tablet 0 06/26/2015 at morning   Scheduled: . aspirin EC  81 mg Oral Daily  . aztreonam  1 g Intravenous Q12H  . carbidopa-levodopa  1 tablet Oral TID   . enoxaparin (LOVENOX) injection  30 mg Subcutaneous Q24H  .  feeding supplement  1 Container Oral TID BM  . guaiFENesin  1,200 mg Oral BID  . insulin aspart  0-9 Units Subcutaneous TID WC  . ipratropium-albuterol  3 mL Nebulization QID  . levothyroxine  75 mcg Oral QAC breakfast  . methylPREDNISolone (SOLU-MEDROL) injection  40 mg Intravenous Q12H  . QUEtiapine  50 mg Oral QHS  . rivastigmine  1.5 mg Oral QHS  . rosuvastatin  20 mg Oral QPM  . sodium chloride flush  3 mL Intravenous Q12H  . vancomycin  500 mg Intravenous Q24H   Continuous: . sodium chloride 10 mL/hr at 07/10/15 0246   BHA:LPFXTK chloride, albuterol, meclizine, ondansetron **OR** ondansetron (ZOFRAN) IV, phenol, phenol-menthol, sodium chloride flush  Assesment: She has health care associated pneumonia. She is being treated with antibiotics and steroids and she is doing better. She is still coughing up a lot of sputum and still congested  She has diabetes being treated with sliding scale  She has Parkinson's disease advanced being treated but she also has only body dementia associated with that  She has a history of both thyroid and lung cancer Active Problems:   Diabetes mellitus without complication ([place])   THYROID CANCER, HX OF   TUBERCULOSIS, HX OF   Parkinson's disease ([place])   HCAP (healthcare-associated pneumonia)    Plan: Continue current treatments. I think she'll probably be ready for discharge on 5/12.    LOS: 3 days   Hamad Whyte L 07/12/2015, 9:17 AM

## 2015-07-12 NOTE — Consult Note (Signed)
Reason for [place] Referring Physician:  Daughter is providing hx.  Krista [name] is an 82 y.o. female.  HPI: Admitted thru the ED Sunday. She had been coughing up phelm. She has had diarrhea x 8 days. Some of her stoosl have been black. Her stool was positive for blood when checked by [place] last Friday (07/07/2015).. .  She had one dose of Pepto Bismol last Wednesday.   Daughter says when she eats she feels like it is choking her and makes her sick. When mother coughs she will vomit this brownish colored substance.  Admitted this admission with pneumonia, weak, cough and now covered with Vancomycin and Azactam.  Admitted in April with a small bowel obstruction and medically managed. She appears very febile. There has been no fever. In the past few days she has multiple loose stools (6 to 7). Daughter states she has not been on any recent antibiotics. One loose stool yesterday which was reddish brown in color.  She really is not eating, only a couple of bites here and there per daughter. Takes ASA '81mg'$  daily.           04/23/2006 Sigmoidoscopy.  INDICATION FOR EXAM: Ms. Kassem is a 82 year old female with a thickened sigmoid colon on CT scan.  FINDINGS: 1. Ms. Stiefel had an angulated sigmoid colon approximately 15 cm from  the anal verge. The sigmoid colon was folded on itself and is  likely the area of thickened sigmoid colon seen on CT scan. She  had rare sigmoid diverticulosis. Otherwise no polyps, masses,  inflammatory changes or arteriovenous malformations seen. 2. Normal retroflexed view of the rectum.  Past Medical History  Diagnosis Date  . Hyperlipidemia   . Osteoporosis   . Pneumonia 5/13  . GERD (gastroesophageal reflux disease)   . History of migraine headaches   . Vertigo   . History of tuberculosis 1965  . Thyroid disease 2008    thyroid cancer cured with surgery  . Hypertension   . Diabetes mellitus      controlled with medication  . Parkinson's disease ([place])   . CA - cancer     thyroid - 2008 surg  . Lung cancer ([place]) 09/04/2011    non-small cell ca in L upper lobe  . Cancer ([place])     cervical ca - 2005 - surg and radiation  . Hypothyroidism   . Thyroid disease   . Diabetes mellitus without complication ([place])   . Parkinson disease ([place])   . Dementia   . Tuberculosis     Past Surgical History  Procedure Laterality Date  . Thyroidectomy  2007  . Hip arthroplasty  2010    left  . Cholecystectomy  1994  . Cataract extraction    . Lung biopsy  09/04/2011  . Abdominal hysterectomy  2005    complete  . Flexible sigmoidoscopy  2008    SLF: 1.angulated sigmoid colon 15 cm from anal verge. thickened sigmoid colon seen on CT. sigmoid diverticulosis 2. normal retrofexed view of rectum  . Thyroidectomy    . Abdominal hysterectomy    . Cholecystectomy      Family History  Problem Relation Age of Onset  . Lung disease    . Cancer Sister     breast  . Cancer Sister     brain  . Cancer Sister     ovarian     Social History:  reports that she quit smoking about 33 years ago. Her smoking use included Cigarettes. She  has a 5 pack-year smoking history. She does not have any smokeless tobacco history on file. She reports that she does not drink alcohol or use illicit drugs.  Allergies:  Allergies  Allergen Reactions  . Amoxicillin Nausea Only  . Statins Nausea And Vomiting  . Sulfa Antibiotics Itching  . Sulfonamide Derivatives Nausea And Vomiting    Medications: I have reviewed the patient's current medications.  Results for orders placed or performed during the hospital encounter of 07/19/2015 (from the past 48 hour(s))  Troponin I     Status: Abnormal   Collection Time: 07/10/15  2:25 PM  Result Value Ref Range   Troponin I 0.04 (H) <0.031 ng/mL    Comment:        PERSISTENTLY INCREASED TROPONIN VALUES IN THE RANGE OF 0.04-0.49 ng/mL CAN BE SEEN IN:       -UNSTABLE  ANGINA       -CONGESTIVE HEART FAILURE       -MYOCARDITIS       -CHEST TRAUMA       -ARRYHTHMIAS       -LATE PRESENTING MYOCARDIAL INFARCTION       -COPD   CLINICAL FOLLOW-UP RECOMMENDED.   Glucose, capillary     Status: Abnormal   Collection Time: 07/10/15  4:26 PM  Result Value Ref Range   Glucose-Capillary 149 (H) 65 - 99 mg/dL   Comment 1 Notify RN   Glucose, capillary     Status: None   Collection Time: 07/10/15  8:45 PM  Result Value Ref Range   Glucose-Capillary 90 65 - 99 mg/dL   Comment 1 Notify RN    Comment 2 Document in Chart   Glucose, capillary     Status: Abnormal   Collection Time: 07/11/15  7:17 AM  Result Value Ref Range   Glucose-Capillary 151 (H) 65 - 99 mg/dL   Comment 1 Notify RN    Comment 2 Document in Chart   Glucose, capillary     Status: Abnormal   Collection Time: 07/11/15 11:58 AM  Result Value Ref Range   Glucose-Capillary 193 (H) 65 - 99 mg/dL   Comment 1 Notify RN    Comment 2 Document in Chart   Glucose, capillary     Status: Abnormal   Collection Time: 07/11/15  4:18 PM  Result Value Ref Range   Glucose-Capillary 160 (H) 65 - 99 mg/dL   Comment 1 Notify RN    Comment 2 Document in Chart   Glucose, capillary     Status: Abnormal   Collection Time: 07/11/15 10:19 PM  Result Value Ref Range   Glucose-Capillary 286 (H) 65 - 99 mg/dL   Comment 1 Notify RN    Comment 2 Document in Chart   Glucose, capillary     Status: Abnormal   Collection Time: 07/12/15  7:23 AM  Result Value Ref Range   Glucose-Capillary 220 (H) 65 - 99 mg/dL   Comment 1 Notify RN    Comment 2 Document in Chart   Glucose, capillary     Status: Abnormal   Collection Time: 07/12/15 11:08 AM  Result Value Ref Range   Glucose-Capillary 197 (H) 65 - 99 mg/dL   Comment 1 Notify RN    Comment 2 Document in Chart     No results found.  ROS Blood pressure 118/64, pulse 89, temperature 98.4 F (36.9 C), temperature source Oral, resp. rate 20, height '5\' 2"'$  (1.575 m),  weight 95 lb 0.3 oz (43.1 kg), SpO2 97 %.  Physical Exam  Assessment/Plan: Diarrhea, anorexia, Nausea.  Heme postive stool. C diff is pending. . Anorexia possible related to her pneumonia. Pneumonia: Blood cultures negative so far. Will discuss with Dr. Laural Golden.  GI panel.     Mataio Mele W 07/12/2015, 12:24 PM     Admitted

## 2015-07-13 ENCOUNTER — Encounter (HOSPITAL_COMMUNITY): Payer: Self-pay | Admitting: *Deleted

## 2015-07-13 ENCOUNTER — Encounter (HOSPITAL_COMMUNITY): Admission: EM | Disposition: E | Payer: Self-pay | Source: Home / Self Care | Attending: Pulmonary Disease

## 2015-07-13 DIAGNOSIS — K921 Melena: Secondary | ICD-10-CM

## 2015-07-13 DIAGNOSIS — K922 Gastrointestinal hemorrhage, unspecified: Secondary | ICD-10-CM | POA: Diagnosis not present

## 2015-07-13 DIAGNOSIS — B3781 Candidal esophagitis: Secondary | ICD-10-CM

## 2015-07-13 DIAGNOSIS — A047 Enterocolitis due to Clostridium difficile: Secondary | ICD-10-CM

## 2015-07-13 DIAGNOSIS — R1310 Dysphagia, unspecified: Secondary | ICD-10-CM | POA: Diagnosis present

## 2015-07-13 DIAGNOSIS — K3189 Other diseases of stomach and duodenum: Secondary | ICD-10-CM

## 2015-07-13 HISTORY — PX: ESOPHAGOGASTRODUODENOSCOPY: SHX5428

## 2015-07-13 LAB — GASTROINTESTINAL PANEL BY PCR, STOOL (REPLACES STOOL CULTURE)
Adenovirus F40/41: NOT DETECTED
Astrovirus: NOT DETECTED
CYCLOSPORA CAYETANENSIS: NOT DETECTED
Campylobacter species: NOT DETECTED
Cryptosporidium: NOT DETECTED
E. coli O157: NOT DETECTED
ENTAMOEBA HISTOLYTICA: NOT DETECTED
ENTEROAGGREGATIVE E COLI (EAEC): NOT DETECTED
Enteropathogenic E coli (EPEC): NOT DETECTED
Enterotoxigenic E coli (ETEC): NOT DETECTED
GIARDIA LAMBLIA: NOT DETECTED
NOROVIRUS GI/GII: NOT DETECTED
Plesimonas shigelloides: NOT DETECTED
Rotavirus A: NOT DETECTED
SALMONELLA SPECIES: NOT DETECTED
SHIGELLA/ENTEROINVASIVE E COLI (EIEC): NOT DETECTED
Sapovirus (I, II, IV, and V): NOT DETECTED
Shiga like toxin producing E coli (STEC): NOT DETECTED
VIBRIO CHOLERAE: NOT DETECTED
VIBRIO SPECIES: NOT DETECTED
YERSINIA ENTEROCOLITICA: NOT DETECTED

## 2015-07-13 LAB — CBC WITH DIFFERENTIAL/PLATELET
BASOS PCT: 0 %
Basophils Absolute: 0 10*3/uL (ref 0.0–0.1)
EOS ABS: 0 10*3/uL (ref 0.0–0.7)
EOS PCT: 0 %
HCT: 26.4 % — ABNORMAL LOW (ref 36.0–46.0)
Hemoglobin: 8.6 g/dL — ABNORMAL LOW (ref 12.0–15.0)
LYMPHS ABS: 0.3 10*3/uL — AB (ref 0.7–4.0)
Lymphocytes Relative: 3 %
MCH: 27 pg (ref 26.0–34.0)
MCHC: 32.6 g/dL (ref 30.0–36.0)
MCV: 83 fL (ref 78.0–100.0)
MONOS PCT: 3 %
Monocytes Absolute: 0.2 10*3/uL (ref 0.1–1.0)
NEUTROS PCT: 94 %
Neutro Abs: 7.6 10*3/uL (ref 1.7–7.7)
PLATELETS: 250 10*3/uL (ref 150–400)
RBC: 3.18 MIL/uL — ABNORMAL LOW (ref 3.87–5.11)
RDW: 18.2 % — ABNORMAL HIGH (ref 11.5–15.5)
WBC: 8.1 10*3/uL (ref 4.0–10.5)

## 2015-07-13 LAB — GLUCOSE, CAPILLARY
GLUCOSE-CAPILLARY: 136 mg/dL — AB (ref 65–99)
GLUCOSE-CAPILLARY: 85 mg/dL (ref 65–99)
GLUCOSE-CAPILLARY: 87 mg/dL (ref 65–99)
Glucose-Capillary: 182 mg/dL — ABNORMAL HIGH (ref 65–99)

## 2015-07-13 LAB — KOH PREP

## 2015-07-13 LAB — BASIC METABOLIC PANEL
Anion gap: 11 (ref 5–15)
BUN: 81 mg/dL — AB (ref 6–20)
CHLORIDE: 108 mmol/L (ref 101–111)
CO2: 22 mmol/L (ref 22–32)
CREATININE: 1.65 mg/dL — AB (ref 0.44–1.00)
Calcium: 7.7 mg/dL — ABNORMAL LOW (ref 8.9–10.3)
GFR calc Af Amer: 32 mL/min — ABNORMAL LOW (ref >60)
GFR calc non Af Amer: 28 mL/min — ABNORMAL LOW (ref >60)
GLUCOSE: 190 mg/dL — AB (ref 65–99)
Potassium: 4 mmol/L (ref 3.5–5.1)
SODIUM: 141 mmol/L (ref 135–145)

## 2015-07-13 LAB — MRSA PCR SCREENING: MRSA by PCR: NEGATIVE

## 2015-07-13 LAB — PREPARE RBC (CROSSMATCH)

## 2015-07-13 LAB — HEMOGLOBIN AND HEMATOCRIT, BLOOD
HCT: 33.2 % — ABNORMAL LOW (ref 36.0–46.0)
HEMOGLOBIN: 11 g/dL — AB (ref 12.0–15.0)

## 2015-07-13 LAB — C DIFFICILE QUICK SCREEN W PCR REFLEX
C DIFFICILE (CDIFF) TOXIN: POSITIVE — AB
C DIFFICLE (CDIFF) ANTIGEN: POSITIVE — AB

## 2015-07-13 SURGERY — EGD (ESOPHAGOGASTRODUODENOSCOPY)
Anesthesia: Moderate Sedation

## 2015-07-13 MED ORDER — MIDAZOLAM HCL 5 MG/5ML IJ SOLN
INTRAMUSCULAR | Status: AC
  Filled 2015-07-13: qty 10

## 2015-07-13 MED ORDER — STERILE WATER FOR IRRIGATION IR SOLN
Status: DC | PRN
  Administered 2015-07-13: 12:00:00

## 2015-07-13 MED ORDER — SODIUM CHLORIDE 0.9% FLUSH
INTRAVENOUS | Status: AC
  Filled 2015-07-13: qty 10

## 2015-07-13 MED ORDER — MEPERIDINE HCL 100 MG/ML IJ SOLN
INTRAMUSCULAR | Status: AC
  Filled 2015-07-13: qty 2

## 2015-07-13 MED ORDER — MEPERIDINE HCL 100 MG/ML IJ SOLN
INTRAMUSCULAR | Status: DC | PRN
  Administered 2015-07-13: 10 mg via INTRAVENOUS

## 2015-07-13 MED ORDER — PANTOPRAZOLE SODIUM 40 MG IV SOLR
40.0000 mg | Freq: Two times a day (BID) | INTRAVENOUS | Status: DC
  Administered 2015-07-13: 40 mg via INTRAVENOUS
  Filled 2015-07-13: qty 40

## 2015-07-13 MED ORDER — PANTOPRAZOLE SODIUM 40 MG IV SOLR
40.0000 mg | Freq: Every day | INTRAVENOUS | Status: DC
  Filled 2015-07-13: qty 40

## 2015-07-13 MED ORDER — LIDOCAINE VISCOUS 2 % MT SOLN
OROMUCOSAL | Status: DC | PRN
  Administered 2015-07-13: 3 mL via OROMUCOSAL

## 2015-07-13 MED ORDER — ONDANSETRON HCL 4 MG/2ML IJ SOLN
INTRAMUSCULAR | Status: AC
  Filled 2015-07-13: qty 2

## 2015-07-13 MED ORDER — SODIUM CHLORIDE 0.9 % IV SOLN: Freq: Once | INTRAVENOUS | Status: AC

## 2015-07-13 MED ORDER — LIDOCAINE VISCOUS 2 % MT SOLN
OROMUCOSAL | Status: AC
  Filled 2015-07-13: qty 15

## 2015-07-13 MED ORDER — RIVASTIGMINE TARTRATE 1.5 MG PO CAPS
ORAL_CAPSULE | ORAL | Status: AC
  Filled 2015-07-13: qty 1

## 2015-07-13 MED ORDER — AZTREONAM 1 G IJ SOLR
INTRAMUSCULAR | Status: AC
  Filled 2015-07-13: qty 1

## 2015-07-13 MED ORDER — ONDANSETRON HCL 4 MG/2ML IJ SOLN
INTRAMUSCULAR | Status: DC | PRN
  Administered 2015-07-13: 4 mg via INTRAVENOUS

## 2015-07-13 MED ORDER — VANCOMYCIN 50 MG/ML ORAL SOLUTION
125.0000 mg | Freq: Four times a day (QID) | ORAL | Status: DC
  Administered 2015-07-13 – 2015-07-14 (×5): 125 mg via ORAL
  Filled 2015-07-13 (×18): qty 2.5

## 2015-07-13 MED ORDER — MIDAZOLAM HCL 5 MG/5ML IJ SOLN
INTRAMUSCULAR | Status: DC | PRN
  Administered 2015-07-13: 1 mg via INTRAVENOUS

## 2015-07-13 MED ORDER — SODIUM CHLORIDE 0.9 % IV SOLN: 250.0000 mL | INTRAVENOUS | Status: DC | PRN

## 2015-07-13 NOTE — Progress Notes (Signed)
Briefly saw patient given noted GI bleeding now. She is also positive for C. difficile. Discussed with Dr. Luan Pulling. We'll start oral vancomycin. PPI for GI bleeding. Hemoglobin pending, plans for transfusion as needed. Suspect she will require at least 1 unit of packed red blood cells. Her last hemoglobin was on 07/10/2015, 9.4.  Per nursing staff, Alma Downs, RN and patient's daughter, patient began having initially bright red blood around 2 AM. Subsequent 3 stools have been maroon to black in color, heme positive.  Plans on upper endoscopy later today, likely pending blood transfusion. PPI IV twice a day ordered given acute GI bleed (benefits outweigh risk at this point).  Laureen Ochs. Bernarda Caffey [facility] [phone] 05/03/201710:07 AM   Lab Results  Component Value Date   WBC 8.1 07/06/2015   HGB 8.6* 07/07/2015   HCT 26.4* 08/02/2015   MCV 83.0 08/02/2015   PLT 250 07/15/2015   Current Hgb in setting of active bleeding. Down from 9.4 3 days ago. Discussed with Dr. Gala Romney. Plan on one unit of prbcs as soon as available. EGD today.  Laureen Ochs. Bernarda Caffey [facility] [phone] 5/11/201710:13 AM

## 2015-07-13 NOTE — Op Note (Signed)
[facility] Patient Name: Krista [name] Procedure Date: 07/22/2015 11:42 AM MRN: [id number] Date of Birth: [birth year] Attending MD: Norvel Richards , MD CSN: [id number] Age: 82 Admit Type: Inpatient Procedure:                Upper GI endoscopy with gastric biopsy and                            esophageal brushing Indications:              Hematochezia, Melena Providers:                Norvel Richards, MD, Gwenlyn Fudge, RN, Randa Spike, Technician Referring MD:              Medicines:                Midazolam 1 mg IV, Meperidine 10 mg IV, Ondansetron                            4 mg IV Complications:            No immediate complications. Estimated Blood Loss:     Estimated blood loss was minimal. Procedure:                Pre-Anesthesia Assessment:                           - Prior to the procedure, a History and Physical                            was performed, and patient medications and                            allergies were reviewed. The patient's tolerance of                            previous anesthesia was also reviewed. The risks                            and benefits of the procedure and the sedation                            options and risks were discussed with the patient.                            All questions were answered, and informed consent                            was obtained. Prior Anticoagulants: The patient has                            taken no previous anticoagulant or antiplatelet  agents. ASA Grade Assessment: IV - A patient with                            severe systemic disease that is a constant threat                            to life. After reviewing the risks and benefits,                            the patient was deemed in satisfactory condition to                            undergo the procedure.                           After obtaining informed consent, the  endoscope was                            passed under direct vision. Throughout the                            procedure, the patient's blood pressure, pulse, and                            oxygen saturations were monitored continuously. The                            EG-[id number] ([id number]) scope was introduced through the                            mouth, and advanced to the second part of duodenum.                            The upper GI endoscopy was accomplished without                            difficulty. The patient tolerated the procedure                            well. The upper GI endoscopy was accomplished                            without difficulty. The patient tolerated the                            procedure well. Scope In: 12:15:14 PM Scope Out: 12:23:29 PM Total Procedure Duration: 0 hours 8 minutes 15 seconds  Findings:      Yellowish plaques lining the entire tubular esophagus suspicious for       Candida esophagitis.. The tubular esophagus was patent throughout its       course. Stomach contained quite a bit of bile-stained fluid and some       retained gastric contents. I was unable to see all of the gastric  mucosa. Gastric mucosa that was seen was intensely erythematous. The       patient did have a small hiatal hernia ; pylorus deformed but the mucosa       through this segment appeared entirely normal; pyloric channel patent       and easily traversed; examination of bulb and second portion revealed no       abnormalities      Diffuse severely erythematous mucosa without bleeding was found in the       entire examined stomach.      Please note there was no blood in the upper GI tract. gastric mucosa was       biopsied for histologic study. Esophageal mucosa brushed for KOH prep Impression:               - Probable Monilial esophagitis - KOH brushing done                            - Retained gastric contents                           - Erythematous gastric  mucosa in the stomach.                            Retained gastric contents. Deformed pylorus but no                            ulcer. No blood in the upper GI tract. All of the                            gastric mucosal surfaces not seen. Status post                            gastric biopsy. I doubt she has bleeding from her                            upper GI tract. Suspect colonic or small bowel                            origin.                           Patient's hemodynamic status tenuous at this time.                            She is not fit for any other invasive procedure                            such as a colonoscopy, etc. at this time. Patient                            has coexisting Clostridium difficile colitis.                            Discussed with Dr. Luan Pulling;.  we'll move the patient to the intensive care unit.                            I have discussed the critical/life-threatening                            nature of her current medical problems with no less                            than 6 family members in the endoscopy unit.                           Continue PPI but once daily therapy should                            suffice?"the benefits likely still outweigh the                            risks. Continue treatment in the way of oral                            vancomycin. She may need augmentation with IV                            Flagyl. Overall, prognosis guarded.                           Dr. Oneida Alar will begin seeing tomorrow in my absence.                           Follow-up biopsies/KOH prep Moderate Sedation:      Moderate (conscious) sedation was administered by the endoscopy nurse       and supervised by the endoscopist. The following parameters were       monitored: oxygen saturation, heart rate, blood pressure, respiratory       rate, EKG, adequacy of pulmonary ventilation, and response to care.       Total physician  intraservice time was 14 minutes. Recommendation:           - Return patient to ICU for ongoing care.                           - Clear liquid diet.                           - Continue present medications.                           - Await pathology results.                           - No repeat upper endoscopy.                           - Return to GI clinic (date not yet determined). Procedure Code(s):        ---  Professional ---                           [phone], Esophagogastroduodenoscopy, flexible,                            transoral; with biopsy, single or multiple                           99152, Moderate sedation services provided by the                            same physician or other qualified health care                            professional performing the diagnostic or                            therapeutic service that the sedation supports,                            requiring the presence of an independent trained                            observer to assist in the monitoring of the                            patient's level of consciousness and physiological                            status; initial 15 minutes of intraservice time,                            patient age 51 years or older Diagnosis Code(s):        --- Professional ---                           B37.81, Candidal esophagitis                           K31.89, Other diseases of stomach and duodenum                           K92.1, Melena (includes Hematochezia) CPT copyright 2016 [employer]. All rights reserved. The codes documented in this report are preliminary and upon coder review may  be revised to meet current compliance requirements. Cristopher Estimable. Rourk, MD Norvel Richards, MD 07/03/2015 12:53:52 PM This report has been signed electronically. Number of Addenda: 0

## 2015-07-13 NOTE — Progress Notes (Signed)
Patient having maroon colored diarrhea, on-call MD paged.

## 2015-07-13 NOTE — Progress Notes (Signed)
Paged Dr. Luan Pulling about patients stools being maroon in color.  No response day nurse notified and will contact MD.

## 2015-07-13 NOTE — Progress Notes (Signed)
Subjective: She's having more diarrhea and has had maroon stools that are positive for blood. No complaints of abdominal pain. She is also now positive for C. difficile  Objective: Vital signs in last 24 hours: Temp:  [97.9 F (36.6 C)-98.3 F (36.8 C)] 97.9 F (36.6 C) (05/11 0540) Pulse Rate:  [87-106] 98 (05/11 0540) Resp:  [20] 20 (05/11 0540) BP: (99-111)/(56-61) 101/56 mmHg (05/11 0540) SpO2:  [97 %-100 %] 99 % (05/11 0540) Weight change:  Last BM Date: 07/11/15  Intake/Output from previous day: 05/10 0701 - 05/11 0700 In: 360 [P.O.:360] Out: -   PHYSICAL EXAM General appearance: alert, cooperative and no distress Resp: clear to auscultation bilaterally Cardio: regular rate and rhythm, S1, S2 normal, no murmur, click, rub or gallop GI: soft, non-tender; bowel sounds normal; no masses,  no organomegaly Extremities: extremities normal, atraumatic, no cyanosis or edema  Lab Results:  Results for orders placed or performed during the hospital encounter of 07/18/2015 (from the past 48 hour(s))  Glucose, capillary     Status: Abnormal   Collection Time: 07/11/15 11:58 AM  Result Value Ref Range   Glucose-Capillary 193 (H) 65 - 99 mg/dL   Comment 1 Notify RN    Comment 2 Document in Chart   Glucose, capillary     Status: Abnormal   Collection Time: 07/11/15  4:18 PM  Result Value Ref Range   Glucose-Capillary 160 (H) 65 - 99 mg/dL   Comment 1 Notify RN    Comment 2 Document in Chart   Glucose, capillary     Status: Abnormal   Collection Time: 07/11/15 10:19 PM  Result Value Ref Range   Glucose-Capillary 286 (H) 65 - 99 mg/dL   Comment 1 Notify RN    Comment 2 Document in Chart   Glucose, capillary     Status: Abnormal   Collection Time: 07/12/15  7:23 AM  Result Value Ref Range   Glucose-Capillary 220 (H) 65 - 99 mg/dL   Comment 1 Notify RN    Comment 2 Document in Chart   Glucose, capillary     Status: Abnormal   Collection Time: 07/12/15 11:08 AM  Result Value  Ref Range   Glucose-Capillary 197 (H) 65 - 99 mg/dL   Comment 1 Notify RN    Comment 2 Document in Chart   Glucose, capillary     Status: Abnormal   Collection Time: 07/12/15  4:17 PM  Result Value Ref Range   Glucose-Capillary 216 (H) 65 - 99 mg/dL   Comment 1 Notify RN    Comment 2 Document in Chart   Glucose, capillary     Status: Abnormal   Collection Time: 07/12/15  9:52 PM  Result Value Ref Range   Glucose-Capillary 106 (H) 65 - 99 mg/dL   Comment 1 Notify RN    Comment 2 Document in Chart   C difficile quick scan w PCR reflex     Status: Abnormal   Collection Time: 07/31/2015  2:50 AM  Result Value Ref Range   C Diff antigen POSITIVE (A) NEGATIVE    Comment: CRITICAL RESULT CALLED TO, READ BACK BY AND VERIFIED WITH: KRISTI THOMAS RN ON [id number] AT 0700 BY RESSEGGER R    C Diff toxin POSITIVE (A) NEGATIVE    Comment: CRITICAL RESULT CALLED TO, READ BACK BY AND VERIFIED WITH: KRISTI THOMAS RN ON [id number] AT 0700 BY RESSEGGER R    C Diff interpretation VALID   Basic metabolic panel     Status:  Abnormal   Collection Time: 07/15/2015  6:20 AM  Result Value Ref Range   Sodium 141 135 - 145 mmol/L   Potassium 4.0 3.5 - 5.1 mmol/L   Chloride 108 101 - 111 mmol/L   CO2 22 22 - 32 mmol/L   Glucose, Bld 190 (H) 65 - 99 mg/dL   BUN 81 (H) 6 - 20 mg/dL   Creatinine, Ser 1.65 (H) 0.44 - 1.00 mg/dL   Calcium 7.7 (L) 8.9 - 10.3 mg/dL   GFR calc non Af Amer 28 (L) >60 mL/min   GFR calc Af Amer 32 (L) >60 mL/min    Comment: (NOTE) The eGFR has been calculated using the CKD EPI equation. This calculation has not been validated in all clinical situations. eGFR's persistently <60 mL/min signify possible Chronic Kidney Disease.    Anion gap 11 5 - 15  Glucose, capillary     Status: Abnormal   Collection Time: 08/02/2015  7:39 AM  Result Value Ref Range   Glucose-Capillary 182 (H) 65 - 99 mg/dL    ABGS No results for input(s): PHART, PO2ART, TCO2, HCO3 in the last 72 hours.  Invalid  input(s): PCO2 CULTURES Recent Results (from the past 240 hour(s))  Urine culture     Status: None   Collection Time: 07/10/15 12:01 AM  Result Value Ref Range Status   Specimen Description URINE, CLEAN CATCH  Final   Special Requests NONE  Final   Culture   Final    NO GROWTH 2 DAYS Performed at [facility]    Report Status 07/12/2015 FINAL  Final  Culture, blood (Routine X 2) w Reflex to ID Panel     Status: None (Preliminary result)   Collection Time: 07/10/15 12:07 AM  Result Value Ref Range Status   Specimen Description BLOOD RIGHT ANTECUBITAL  Final   Special Requests BOTTLES DRAWN AEROBIC AND ANAEROBIC 6CC  Final   Culture NO GROWTH 2 DAYS  Final   Report Status PENDING  Incomplete  Culture, blood (Routine X 2) w Reflex to ID Panel     Status: None (Preliminary result)   Collection Time: 07/10/15 12:14 AM  Result Value Ref Range Status   Specimen Description BLOOD RIGHT ARM  Final   Special Requests BOTTLES DRAWN AEROBIC ONLY 5CC  Final   Culture NO GROWTH 2 DAYS  Final   Report Status PENDING  Incomplete  C difficile quick scan w PCR reflex     Status: Abnormal   Collection Time: 08/02/2015  2:50 AM  Result Value Ref Range Status   C Diff antigen POSITIVE (A) NEGATIVE Final    Comment: CRITICAL RESULT CALLED TO, READ BACK BY AND VERIFIED WITH: KRISTI THOMAS RN ON [id number] AT 0700 BY RESSEGGER R    C Diff toxin POSITIVE (A) NEGATIVE Final    Comment: CRITICAL RESULT CALLED TO, READ BACK BY AND VERIFIED WITH: KRISTI THOMAS RN ON [id number] AT 0700 BY RESSEGGER R    C Diff interpretation VALID  Final   Studies/Results: No results found.  Medications:  Prior to Admission:  Prescriptions prior to admission  Medication Sig Dispense Refill Last Dose  . aspirin EC 81 MG tablet Take 81 mg by mouth daily.   07/29/2015  . carbidopa-levodopa (SINEMET IR) 25-100 MG per tablet Take 1 tablet by mouth 3 (three) times daily.    07/24/2015  . Cyanocobalamin (VITAMIN B 12 PO) Take  500 mcg by mouth daily.   07/04/2015  . dextromethorphan-guaiFENesin ([place] DM) 30-600  MG 12hr tablet Take 1 tablet by mouth 2 (two) times daily as needed for cough.   07/24/2015  . ketorolac (ACULAR) 0.5 % ophthalmic solution Place 1 drop into both eyes 2 (two) times daily.    07/23/2015  . levothyroxine (SYNTHROID, LEVOTHROID) 75 MCG tablet TAKE 1 TABLET BY MOUTH DAILY BEFORE BREAKFAST. 30 tablet 0 07/20/2015  . loperamide (IMODIUM) 2 MG capsule Take 2 mg by mouth as needed for diarrhea or loose stools.   07/24/2015  . meclizine (ANTIVERT) 25 MG tablet Take 25 mg by mouth 3 (three) times daily as needed for dizziness.    07/21/2015  . QUEtiapine (SEROQUEL) 25 MG tablet Take 2 tablets by mouth at bedtime.   07/08/2015  . rivastigmine (EXELON) 1.5 MG capsule Take 1.5 mg by mouth at bedtime.    07/08/2015  . rosuvastatin (CRESTOR) 20 MG tablet Take 20 mg by mouth every evening.    07/08/2015  . TRADJENTA 5 MG TABS tablet TAKE (1) TABLET BY MOUTH EACH MORNING. 30 tablet 2 07/11/2015  . ondansetron (ZOFRAN ODT) 8 MG disintegrating tablet Take 0.5 tablets (4 mg total) by mouth every 8 (eight) hours as needed for nausea or vomiting. 20 tablet 0 06/26/2015 at morning   Scheduled: . sodium chloride   Intravenous Once  . aztreonam  1 g Intravenous Q12H  . carbidopa-levodopa  1 tablet Oral TID  . feeding supplement  1 Container Oral TID BM  . guaiFENesin  1,200 mg Oral BID  . insulin aspart  0-9 Units Subcutaneous TID WC  . ipratropium-albuterol  3 mL Nebulization QID  . levothyroxine  75 mcg Oral QAC breakfast  . methylPREDNISolone (SOLU-MEDROL) injection  40 mg Intravenous Q12H  . pantoprazole (PROTONIX) IV  40 mg Intravenous Q12H  . QUEtiapine  50 mg Oral QHS  . rivastigmine  1.5 mg Oral QHS  . rosuvastatin  20 mg Oral QPM  . sodium chloride flush  3 mL Intravenous Q12H  . vancomycin  500 mg Intravenous Q24H   Continuous: . sodium chloride 10 mL/hr at 07/10/15 0246  . sodium chloride     MCE:YEMVVK chloride,  acetaminophen, albuterol, meclizine, menthol-cetylpyridinium, ondansetron **OR** ondansetron (ZOFRAN) IV, phenol, sodium chloride flush  Assesment: She now has what seems to be GI bleeding. She is positive for C. difficile. She is scheduled for EGD later today but I think she is going to need a blood transfusion. Will discuss with gastroenterology. Active Problems:   Diabetes mellitus without complication (HCC)   THYROID CANCER, HX OF   TUBERCULOSIS, HX OF   Parkinson's disease ([place])   HCAP (healthcare-associated pneumonia)    Plan: Probable transfusion. EGD later today. Continue other treatments.    LOS: 4 days   Ashari Llewellyn L 07/24/2015, 8:43 AM

## 2015-07-14 ENCOUNTER — Inpatient Hospital Stay (HOSPITAL_COMMUNITY): Payer: Medicare Other

## 2015-07-14 ENCOUNTER — Other Ambulatory Visit: Payer: Self-pay | Admitting: "Endocrinology

## 2015-07-14 ENCOUNTER — Encounter (HOSPITAL_COMMUNITY): Payer: Self-pay | Admitting: Internal Medicine

## 2015-07-14 DIAGNOSIS — K921 Melena: Secondary | ICD-10-CM | POA: Diagnosis not present

## 2015-07-14 DIAGNOSIS — B3781 Candidal esophagitis: Secondary | ICD-10-CM | POA: Diagnosis present

## 2015-07-14 DIAGNOSIS — R140 Abdominal distension (gaseous): Secondary | ICD-10-CM

## 2015-07-14 LAB — CBC WITH DIFFERENTIAL/PLATELET
BASOS ABS: 0.1 10*3/uL (ref 0.0–0.1)
Basophils Relative: 1 %
EOS PCT: 0 %
Eosinophils Absolute: 0 10*3/uL (ref 0.0–0.7)
HCT: 33.5 % — ABNORMAL LOW (ref 36.0–46.0)
Hemoglobin: 11.1 g/dL — ABNORMAL LOW (ref 12.0–15.0)
LYMPHS PCT: 4 %
Lymphs Abs: 0.7 10*3/uL (ref 0.7–4.0)
MCH: 27.5 pg (ref 26.0–34.0)
MCHC: 33.1 g/dL (ref 30.0–36.0)
MCV: 83.1 fL (ref 78.0–100.0)
MONO ABS: 0 10*3/uL — AB (ref 0.1–1.0)
Monocytes Relative: 0 %
Neutro Abs: 16.1 10*3/uL — ABNORMAL HIGH (ref 1.7–7.7)
Neutrophils Relative %: 95 %
PLATELETS: 130 10*3/uL — AB (ref 150–400)
RBC: 4.03 MIL/uL (ref 3.87–5.11)
RDW: 17.2 % — AB (ref 11.5–15.5)
WBC: 16.9 10*3/uL — ABNORMAL HIGH (ref 4.0–10.5)

## 2015-07-14 LAB — HEMOGLOBIN AND HEMATOCRIT, BLOOD
HCT: 31.8 % — ABNORMAL LOW (ref 36.0–46.0)
HEMOGLOBIN: 10.5 g/dL — AB (ref 12.0–15.0)

## 2015-07-14 LAB — GLUCOSE, CAPILLARY
Glucose-Capillary: 143 mg/dL — ABNORMAL HIGH (ref 65–99)
Glucose-Capillary: 113 mg/dL — ABNORMAL HIGH (ref 65–99)
Glucose-Capillary: 102 mg/dL — ABNORMAL HIGH (ref 65–99)

## 2015-07-14 LAB — BASIC METABOLIC PANEL
Anion gap: 14 (ref 5–15)
BUN: 102 mg/dL — AB (ref 6–20)
CO2: 12 mmol/L — ABNORMAL LOW (ref 22–32)
CREATININE: 2.7 mg/dL — AB (ref 0.44–1.00)
Calcium: 6.2 mg/dL — CL (ref 8.9–10.3)
Chloride: 112 mmol/L — ABNORMAL HIGH (ref 101–111)
GFR calc Af Amer: 18 mL/min — ABNORMAL LOW (ref >60)
GFR, EST NON AFRICAN AMERICAN: 15 mL/min — AB (ref >60)
GLUCOSE: 136 mg/dL — AB (ref 65–99)
POTASSIUM: 4.3 mmol/L (ref 3.5–5.1)
Sodium: 138 mmol/L (ref 135–145)

## 2015-07-14 LAB — TYPE AND SCREEN
ABO/RH(D): B POS
ANTIBODY SCREEN: NEGATIVE
Unit division: 0
Unit division: 0

## 2015-07-14 LAB — LACTIC ACID, PLASMA: Lactic Acid, Venous: 4.6 mmol/L (ref 0.5–2.0)

## 2015-07-14 MED ORDER — SODIUM CHLORIDE 0.9 % IV SOLN
INTRAVENOUS | Status: DC
  Administered 2015-07-14 (×2): via INTRAVENOUS

## 2015-07-14 MED ORDER — FLUCONAZOLE IN SODIUM CHLORIDE 200-0.9 MG/100ML-% IV SOLN
200.0000 mg | Freq: Once | INTRAVENOUS | Status: AC
  Administered 2015-07-14: 200 mg via INTRAVENOUS
  Filled 2015-07-14: qty 100

## 2015-07-14 MED ORDER — FLUCONAZOLE IN SODIUM CHLORIDE 100-0.9 MG/50ML-% IV SOLN
100.0000 mg | INTRAVENOUS | Status: DC
  Filled 2015-07-14 (×3): qty 50

## 2015-07-14 MED ORDER — SODIUM CHLORIDE 0.9 % IV BOLUS (SEPSIS)
1000.0000 mL | Freq: Once | INTRAVENOUS | Status: AC
  Administered 2015-07-14: 1000 mL via INTRAVENOUS

## 2015-07-14 MED ORDER — VANCOMYCIN HCL IN DEXTROSE 1-5 GM/200ML-% IV SOLN: 1000.0000 mg | Freq: Once | INTRAVENOUS | Status: DC

## 2015-07-14 MED ORDER — SODIUM CHLORIDE 0.9 % IV BOLUS (SEPSIS)
300.0000 mL | Freq: Once | INTRAVENOUS | Status: AC
  Administered 2015-07-14: 300 mL via INTRAVENOUS

## 2015-07-14 MED ORDER — FLUCONAZOLE IN SODIUM CHLORIDE 100-0.9 MG/50ML-% IV SOLN
50.0000 mg | INTRAVENOUS | Status: DC
  Filled 2015-07-14 (×3): qty 25

## 2015-07-14 MED ORDER — VANCOMYCIN HCL 500 MG IV SOLR
500.0000 mg | INTRAVENOUS | Status: DC
  Filled 2015-07-14: qty 500

## 2015-07-14 MED ORDER — METRONIDAZOLE IN NACL 5-0.79 MG/ML-% IV SOLN
500.0000 mg | Freq: Three times a day (TID) | INTRAVENOUS | Status: DC
  Administered 2015-07-14 (×2): 500 mg via INTRAVENOUS
  Filled 2015-07-14 (×2): qty 100

## 2015-07-14 MED ORDER — DOPAMINE-DEXTROSE 3.2-5 MG/ML-% IV SOLN
0.0000 ug/kg/min | INTRAVENOUS | Status: DC
  Administered 2015-07-14: 5 ug/kg/min via INTRAVENOUS
  Filled 2015-07-14: qty 250

## 2015-07-14 MED ORDER — LORAZEPAM 2 MG/ML IJ SOLN
0.5000 mg | INTRAMUSCULAR | Status: DC | PRN
  Administered 2015-07-14: 0.5 mg via INTRAVENOUS
  Filled 2015-07-14: qty 1

## 2015-07-14 MED ORDER — VANCOMYCIN HCL 500 MG IV SOLR
500.0000 mg | Freq: Four times a day (QID) | Status: DC
  Filled 2015-07-14 (×12): qty 500

## 2015-07-14 MED ORDER — VANCOMYCIN HCL 500 MG IV SOLR
INTRAVENOUS | Status: AC
  Filled 2015-07-14: qty 1000

## 2015-07-15 DIAGNOSIS — A0472 Enterocolitis due to Clostridium difficile, not specified as recurrent: Secondary | ICD-10-CM | POA: Diagnosis present

## 2015-07-15 DIAGNOSIS — D62 Acute posthemorrhagic anemia: Secondary | ICD-10-CM | POA: Diagnosis present

## 2015-07-15 LAB — CULTURE, BLOOD (ROUTINE X 2)
CULTURE: NO GROWTH
CULTURE: NO GROWTH

## 2015-07-17 NOTE — Progress Notes (Signed)
Quick Note:  Forwarding to [place], NP. Pt has deceased! ______

## 2015-07-19 LAB — CULTURE, BLOOD (ROUTINE X 2)
CULTURE: NO GROWTH
Culture: NO GROWTH

## 2015-07-19 NOTE — Progress Notes (Signed)
Noted, thanks!

## 2015-08-03 NOTE — Progress Notes (Addendum)
Subjective: She was transferred to the intensive care unit yesterday after her endoscopy. She has still been having some blood per rectum. She's been agitated this morning.  Objective: Vital signs in last 24 hours: Temp:  [97.3 F (36.3 C)-98.4 F (36.9 C)] 97.3 F (36.3 C) (05/12 0400) Pulse Rate:  [91-101] 91 (05/11 1500) Resp:  [20-31] 30 (05/12 0600) BP: (65-116)/(44-81) 116/81 mmHg (05/12 0600) SpO2:  [87 %-100 %] 100 % (05/11 2007) Weight:  [44.5 kg (98 lb 1.7 oz)] 44.5 kg (98 lb 1.7 oz) (05/12 0500) Weight change:  Last BM Date: 07/26/2015  Intake/Output from previous day: 05/11 0701 - 05/12 0700 In: 2255 [P.O.:120; I.V.:1410; Blood:325; IV Piggyback:400] Out: -   PHYSICAL EXAM General appearance: alert and Anxious and somewhat agitated Resp: rhonchi bilaterally Cardio: regular rate and rhythm, S1, S2 normal, no murmur, click, rub or gallop GI: soft, non-tender; bowel sounds normal; no masses,  no organomegaly Extremities: extremities normal, atraumatic, no cyanosis or edema  Lab Results:  Results for orders placed or performed during the hospital encounter of 07/20/2015 (from the past 48 hour(s))  Glucose, capillary     Status: Abnormal   Collection Time: 07/12/15  7:23 AM  Result Value Ref Range   Glucose-Capillary 220 (H) 65 - 99 mg/dL   Comment 1 Notify RN    Comment 2 Document in Chart   Glucose, capillary     Status: Abnormal   Collection Time: 07/12/15 11:08 AM  Result Value Ref Range   Glucose-Capillary 197 (H) 65 - 99 mg/dL   Comment 1 Notify RN    Comment 2 Document in Chart   Glucose, capillary     Status: Abnormal   Collection Time: 07/12/15  4:17 PM  Result Value Ref Range   Glucose-Capillary 216 (H) 65 - 99 mg/dL   Comment 1 Notify RN    Comment 2 Document in Chart   Glucose, capillary     Status: Abnormal   Collection Time: 07/12/15  9:52 PM  Result Value Ref Range   Glucose-Capillary 106 (H) 65 - 99 mg/dL   Comment 1 Notify RN    Comment 2  Document in Chart   C difficile quick scan w PCR reflex     Status: Abnormal   Collection Time: 08/01/2015  2:50 AM  Result Value Ref Range   C Diff antigen POSITIVE (A) NEGATIVE    Comment: CRITICAL RESULT CALLED TO, READ BACK BY AND VERIFIED WITH: KRISTI THOMAS RN ON [id number] AT 0700 BY RESSEGGER R    C Diff toxin POSITIVE (A) NEGATIVE    Comment: CRITICAL RESULT CALLED TO, READ BACK BY AND VERIFIED WITH: Deeann Dowse RN ON [id number] AT 0700 BY RESSEGGER R    C Diff interpretation VALID   Gastrointestinal Panel by PCR , Stool     Status: None   Collection Time: 08/02/2015  2:50 AM  Result Value Ref Range   Campylobacter species NOT DETECTED NOT DETECTED   Plesimonas shigelloides NOT DETECTED NOT DETECTED   Salmonella species NOT DETECTED NOT DETECTED   Yersinia enterocolitica NOT DETECTED NOT DETECTED   Vibrio species NOT DETECTED NOT DETECTED   Vibrio cholerae NOT DETECTED NOT DETECTED   Enteroaggregative E coli (EAEC) NOT DETECTED NOT DETECTED   Enteropathogenic E coli (EPEC) NOT DETECTED NOT DETECTED   Enterotoxigenic E coli (ETEC) NOT DETECTED NOT DETECTED   Shiga like toxin producing E coli (STEC) NOT DETECTED NOT DETECTED   E. coli O157 NOT DETECTED NOT DETECTED  Shigella/Enteroinvasive E coli (EIEC) NOT DETECTED NOT DETECTED   Cryptosporidium NOT DETECTED NOT DETECTED   Cyclospora cayetanensis NOT DETECTED NOT DETECTED   Entamoeba histolytica NOT DETECTED NOT DETECTED   Giardia lamblia NOT DETECTED NOT DETECTED   Adenovirus F40/41 NOT DETECTED NOT DETECTED   Astrovirus NOT DETECTED NOT DETECTED   Norovirus GI/GII NOT DETECTED NOT DETECTED   Rotavirus A NOT DETECTED NOT DETECTED   Sapovirus (I, II, IV, and V) NOT DETECTED NOT DETECTED  Basic metabolic panel     Status: Abnormal   Collection Time: 07/22/2015  6:20 AM  Result Value Ref Range   Sodium 141 135 - 145 mmol/L   Potassium 4.0 3.5 - 5.1 mmol/L   Chloride 108 101 - 111 mmol/L   CO2 22 22 - 32 mmol/L   Glucose, Bld  190 (H) 65 - 99 mg/dL   BUN 81 (H) 6 - 20 mg/dL   Creatinine, Ser 1.65 (H) 0.44 - 1.00 mg/dL   Calcium 7.7 (L) 8.9 - 10.3 mg/dL   GFR calc non Af Amer 28 (L) >60 mL/min   GFR calc Af Amer 32 (L) >60 mL/min    Comment: (NOTE) The eGFR has been calculated using the CKD EPI equation. This calculation has not been validated in all clinical situations. eGFR's persistently <60 mL/min signify possible Chronic Kidney Disease.    Anion gap 11 5 - 15  Glucose, capillary     Status: Abnormal   Collection Time: 07/12/2015  7:39 AM  Result Value Ref Range   Glucose-Capillary 182 (H) 65 - 99 mg/dL  CBC with Differential/Platelet     Status: Abnormal   Collection Time: 07/16/2015  9:15 AM  Result Value Ref Range   WBC 8.1 4.0 - 10.5 K/uL   RBC 3.18 (L) 3.87 - 5.11 MIL/uL   Hemoglobin 8.6 (L) 12.0 - 15.0 g/dL   HCT 26.4 (L) 36.0 - 46.0 %   MCV 83.0 78.0 - 100.0 fL   MCH 27.0 26.0 - 34.0 pg   MCHC 32.6 30.0 - 36.0 g/dL   RDW 18.2 (H) 11.5 - 15.5 %   Platelets 250 150 - 400 K/uL   Neutrophils Relative % 94 %   Neutro Abs 7.6 1.7 - 7.7 K/uL   Lymphocytes Relative 3 %   Lymphs Abs 0.3 (L) 0.7 - 4.0 K/uL   Monocytes Relative 3 %   Monocytes Absolute 0.2 0.1 - 1.0 K/uL   Eosinophils Relative 0 %   Eosinophils Absolute 0.0 0.0 - 0.7 K/uL   Basophils Relative 0 %   Basophils Absolute 0.0 0.0 - 0.1 K/uL  Type and screen [facility]     Status: None (Preliminary result)   Collection Time: 07/08/2015  9:15 AM  Result Value Ref Range   ABO/RH(D) B POS    Antibody Screen NEG    Sample Expiration 07/16/2015    Unit Number [id number]    Blood Component Type RED CELLS,LR    Unit division 00    Status of Unit ALLOCATED    Transfusion Status OK TO TRANSFUSE    Crossmatch Result Compatible    Unit Number [id number]    Blood Component Type RED CELLS,LR    Unit division 00    Status of Unit ISSUED    Transfusion Status OK TO TRANSFUSE    Crossmatch Result Compatible   Prepare RBC      Status: None   Collection Time: 07/26/2015  9:15 AM  Result Value Ref Range  Order Confirmation ORDER PROCESSED BY BLOOD BANK   Prepare RBC     Status: None   Collection Time: 07/08/2015 11:00 AM  Result Value Ref Range   Order Confirmation ORDER PROCESSED BY BLOOD BANK   Glucose, capillary     Status: Abnormal   Collection Time: 08/01/2015 11:20 AM  Result Value Ref Range   Glucose-Capillary 136 (H) 65 - 99 mg/dL  KOH prep     Status: None   Collection Time: 07/09/2015 12:21 PM  Result Value Ref Range   Specimen Description ESOPHAGUS BRUSHING    Special Requests NONE    KOH Prep      MODERATE YEAST WITH PSEUDOHYPHAE Performed at [facility]    Report Status 07/06/2015 FINAL   MRSA PCR Screening     Status: None   Collection Time: 07/23/2015  1:30 PM  Result Value Ref Range   MRSA by PCR NEGATIVE NEGATIVE    Comment:        The GeneXpert MRSA Assay (FDA approved for NASAL specimens only), is one component of a comprehensive MRSA colonization surveillance program. It is not intended to diagnose MRSA infection nor to guide or monitor treatment for MRSA infections.   Glucose, capillary     Status: None   Collection Time: 07/12/2015  4:00 PM  Result Value Ref Range   Glucose-Capillary 85 65 - 99 mg/dL   Comment 1 Notify RN    Comment 2 Document in Chart   Hemoglobin and hematocrit, blood     Status: Abnormal   Collection Time: 07/03/2015  5:15 PM  Result Value Ref Range   Hemoglobin 11.0 (L) 12.0 - 15.0 g/dL    Comment: DELTA CHECK NOTED   HCT 33.2 (L) 36.0 - 46.0 %  Glucose, capillary     Status: None   Collection Time: 07/03/2015  9:28 PM  Result Value Ref Range   Glucose-Capillary 87 65 - 99 mg/dL  Basic metabolic panel     Status: Abnormal   Collection Time: August 08, 2015  4:56 AM  Result Value Ref Range   Sodium 138 135 - 145 mmol/L   Potassium 4.3 3.5 - 5.1 mmol/L   Chloride 112 (H) 101 - 111 mmol/L   CO2 12 (L) 22 - 32 mmol/L   Glucose, Bld 136 (H) 65 - 99 mg/dL    BUN 102 (H) 6 - 20 mg/dL   Creatinine, Ser 2.70 (H) 0.44 - 1.00 mg/dL    Comment: DELTA CHECK NOTED   Calcium 6.2 (LL) 8.9 - 10.3 mg/dL    Comment: CRITICAL RESULT CALLED TO, READ BACK BY AND VERIFIED WITH: PHILLIPS,C AT 6:40AM ON Aug 08, 2015 BY FESTERMAN,C    GFR calc non Af Amer 15 (L) >60 mL/min   GFR calc Af Amer 18 (L) >60 mL/min    Comment: (NOTE) The eGFR has been calculated using the CKD EPI equation. This calculation has not been validated in all clinical situations. eGFR's persistently <60 mL/min signify possible Chronic Kidney Disease.    Anion gap 14 5 - 15    ABGS No results for input(s): PHART, PO2ART, TCO2, HCO3 in the last 72 hours.  Invalid input(s): PCO2 CULTURES Recent Results (from the past 240 hour(s))  Urine culture     Status: None   Collection Time: 07/10/15 12:01 AM  Result Value Ref Range Status   Specimen Description URINE, CLEAN CATCH  Final   Special Requests NONE  Final   Culture   Final    NO GROWTH 2  DAYS Performed at [facility]    Report Status 07/12/2015 FINAL  Final  Culture, blood (Routine X 2) w Reflex to ID Panel     Status: None (Preliminary result)   Collection Time: 07/10/15 12:07 AM  Result Value Ref Range Status   Specimen Description BLOOD RIGHT ANTECUBITAL  Final   Special Requests BOTTLES DRAWN AEROBIC AND ANAEROBIC [place]  Final   Culture NO GROWTH 3 DAYS  Final   Report Status PENDING  Incomplete  Culture, blood (Routine X 2) w Reflex to ID Panel     Status: None (Preliminary result)   Collection Time: 07/10/15 12:14 AM  Result Value Ref Range Status   Specimen Description BLOOD RIGHT ARM  Final   Special Requests BOTTLES DRAWN AEROBIC ONLY 5CC  Final   Culture NO GROWTH 3 DAYS  Final   Report Status PENDING  Incomplete  C difficile quick scan w PCR reflex     Status: Abnormal   Collection Time: 07/09/2015  2:50 AM  Result Value Ref Range Status   C Diff antigen POSITIVE (A) NEGATIVE Final    Comment: CRITICAL RESULT  CALLED TO, READ BACK BY AND VERIFIED WITH: KRISTI THOMAS RN ON [phone] AT 0700 BY RESSEGGER R    C Diff toxin POSITIVE (A) NEGATIVE Final    Comment: CRITICAL RESULT CALLED TO, READ BACK BY AND VERIFIED WITH: Deeann Dowse RN ON [id number] AT 0700 BY RESSEGGER R    C Diff interpretation VALID  Final  Gastrointestinal Panel by PCR , Stool     Status: None   Collection Time: 07/10/2015  2:50 AM  Result Value Ref Range Status   Campylobacter species NOT DETECTED NOT DETECTED Final   Plesimonas shigelloides NOT DETECTED NOT DETECTED Final   Salmonella species NOT DETECTED NOT DETECTED Final   Yersinia enterocolitica NOT DETECTED NOT DETECTED Final   Vibrio species NOT DETECTED NOT DETECTED Final   Vibrio cholerae NOT DETECTED NOT DETECTED Final   Enteroaggregative E coli (EAEC) NOT DETECTED NOT DETECTED Final   Enteropathogenic E coli (EPEC) NOT DETECTED NOT DETECTED Final   Enterotoxigenic E coli (ETEC) NOT DETECTED NOT DETECTED Final   Shiga like toxin producing E coli (STEC) NOT DETECTED NOT DETECTED Final   E. coli O157 NOT DETECTED NOT DETECTED Final   Shigella/Enteroinvasive E coli (EIEC) NOT DETECTED NOT DETECTED Final   Cryptosporidium NOT DETECTED NOT DETECTED Final   Cyclospora cayetanensis NOT DETECTED NOT DETECTED Final   Entamoeba histolytica NOT DETECTED NOT DETECTED Final   Giardia lamblia NOT DETECTED NOT DETECTED Final   Adenovirus F40/41 NOT DETECTED NOT DETECTED Final   Astrovirus NOT DETECTED NOT DETECTED Final   Norovirus GI/GII NOT DETECTED NOT DETECTED Final   Rotavirus A NOT DETECTED NOT DETECTED Final   Sapovirus (I, II, IV, and V) NOT DETECTED NOT DETECTED Final  KOH prep     Status: None   Collection Time: 07/20/2015 12:21 PM  Result Value Ref Range Status   Specimen Description ESOPHAGUS BRUSHING  Final   Special Requests NONE  Final   KOH Prep   Final    MODERATE YEAST WITH PSEUDOHYPHAE Performed at [facility]    Report Status 07/04/2015 FINAL  Final   MRSA PCR Screening     Status: None   Collection Time: 07/08/2015  1:30 PM  Result Value Ref Range Status   MRSA by PCR NEGATIVE NEGATIVE Final    Comment:        The GeneXpert  MRSA Assay (FDA approved for NASAL specimens only), is one component of a comprehensive MRSA colonization surveillance program. It is not intended to diagnose MRSA infection nor to guide or monitor treatment for MRSA infections.    Studies/Results: No results found.  Medications:  Prior to Admission:  Prescriptions prior to admission  Medication Sig Dispense Refill Last Dose  . aspirin EC 81 MG tablet Take 81 mg by mouth daily.   07/13/2015  . carbidopa-levodopa (SINEMET IR) 25-100 MG per tablet Take 1 tablet by mouth 3 (three) times daily.    07/20/2015  . Cyanocobalamin (VITAMIN B 12 PO) Take 500 mcg by mouth daily.   07/25/2015  . dextromethorphan-guaiFENesin (MUCINEX DM) 30-600 MG 12hr tablet Take 1 tablet by mouth 2 (two) times daily as needed for cough.   07/12/2015  . ketorolac (ACULAR) 0.5 % ophthalmic solution Place 1 drop into both eyes 2 (two) times daily.    07/28/2015  . levothyroxine (SYNTHROID, LEVOTHROID) 75 MCG tablet TAKE 1 TABLET BY MOUTH DAILY BEFORE BREAKFAST. 30 tablet 0 07/03/2015  . loperamide (IMODIUM) 2 MG capsule Take 2 mg by mouth as needed for diarrhea or loose stools.   07/19/2015  . meclizine (ANTIVERT) 25 MG tablet Take 25 mg by mouth 3 (three) times daily as needed for dizziness.    07/17/2015  . QUEtiapine (SEROQUEL) 25 MG tablet Take 2 tablets by mouth at bedtime.   07/08/2015  . rivastigmine (EXELON) 1.5 MG capsule Take 1.5 mg by mouth at bedtime.    07/08/2015  . rosuvastatin (CRESTOR) 20 MG tablet Take 20 mg by mouth every evening.    07/08/2015  . TRADJENTA 5 MG TABS tablet TAKE (1) TABLET BY MOUTH EACH MORNING. 30 tablet 2 07/17/2015  . ondansetron (ZOFRAN ODT) 8 MG disintegrating tablet Take 0.5 tablets (4 mg total) by mouth every 8 (eight) hours as needed for nausea or vomiting. 20 tablet 0  06/26/2015 at morning   Scheduled: . aztreonam  1 g Intravenous Q12H  . carbidopa-levodopa  1 tablet Oral TID  . feeding supplement  1 Container Oral TID BM  . [START ON 07/15/2015] fluconazole (DIFLUCAN) IV  100 mg Intravenous Q24H  . fluconazole (DIFLUCAN) IV  200 mg Intravenous Once  . guaiFENesin  1,200 mg Oral BID  . insulin aspart  0-9 Units Subcutaneous TID WC  . ipratropium-albuterol  3 mL Nebulization QID  . levothyroxine  75 mcg Oral QAC breakfast  . methylPREDNISolone (SOLU-MEDROL) injection  40 mg Intravenous Q12H  . pantoprazole (PROTONIX) IV  40 mg Intravenous QHS  . QUEtiapine  50 mg Oral QHS  . rivastigmine  1.5 mg Oral QHS  . rosuvastatin  20 mg Oral QPM  . sodium chloride flush  3 mL Intravenous Q12H  . vancomycin  125 mg Oral QID   Continuous: . sodium chloride 10 mL/hr at 07/10/15 0246   YMT:LIMWTC chloride, acetaminophen, albuterol, LORazepam, meclizine, menthol-cetylpyridinium, ondansetron **OR** ondansetron (ZOFRAN) IV, phenol, sodium chloride flush  Assesment: She has GI bleeding now. It does not appear to be from an upper GI source but we don't have a definitive source. She has required blood and is still having some bleeding so she may need more.  She's been somewhat agitated. She has dementia at baseline and I suspect that this is related to her medical illnesses and her baseline dementia and being out of her normal environment.  She was admitted with pneumonia and that seems to be doing better.  She has C. difficile and  is on oral vancomycin  She has Candida esophagitis and I put her on IV Diflucan because she's not doing well with taking meds by mouth.  Her calcium level is low but I think it may be related to her low albumin  She has malnutrition at baseline Active Problems:   Diabetes mellitus without complication (HCC)   THYROID CANCER, HX OF   TUBERCULOSIS, HX OF   Parkinson's disease ([place])   HCAP (healthcare-associated pneumonia)   Bleeding  gastrointestinal   Dysphagia    Plan: I changed her to clear liquid diet. She will start the Diflucan. She may require more blood. Continue vancomycin for C. difficile. Add lorazepam for agitation    LOS: 5 days   Jayvan Mcshan L 2015-08-13, 7:15 AM

## 2015-08-03 NOTE — Progress Notes (Signed)
Late entry: 1800 Dr. Cindie Laroche made aware patient continued to be tachycardic, respiratory rate increased 30-40/min, and O2 saturation 60-80's. New order received for labwork, cultures, and additional 300 cc NS bolus. Will continue to monitor.

## 2015-08-03 NOTE — Progress Notes (Signed)
**Note De-Identified  Obfuscation** RT notified that patients SATs in low 60s; placed on 100% NRB 15L.  RRT to continue to monitor.

## 2015-08-03 NOTE — Progress Notes (Signed)
ANTIBIOTIC CONSULT NOTE  Pharmacy Consult for Aztreonam Indication: Pneumonia  Allergies  Allergen Reactions  . Amoxicillin Nausea Only  . Statins Nausea And Vomiting  . Sulfa Antibiotics Itching  . Sulfonamide Derivatives Nausea And Vomiting   Patient Measurements: Height: '5\' 2"'$  (157.5 cm) Weight: 98 lb 1.7 oz (44.5 kg) IBW/kg (Calculated) : 50.1  Vital Signs: Temp: 97.8 F (36.6 C) (05/12 0800) Temp Source: Axillary (05/12 0800) BP: 72/60 mmHg (05/12 0900)  Labs:  Recent Labs  07/24/2015 0620 07/26/2015 0915 07/15/2015 1715 Jul 30, 2015 0456 2015/07/30 0709  WBC  --  8.1  --   --  16.9*  HGB  --  8.6* 11.0*  --  11.1*  PLT  --  250  --   --  130*  CREATININE 1.65*  --   --  2.70*  --    Estimated Creatinine Clearance: 11.3 mL/min (by C-G formula based on Cr of 2.7).  No results for input(s): VANCOTROUGH, VANCOPEAK, VANCORANDOM, GENTTROUGH, GENTPEAK, GENTRANDOM, TOBRATROUGH, TOBRAPEAK, TOBRARND, AMIKACINPEAK, AMIKACINTROU, AMIKACIN in the last 72 hours.   Microbiology: Recent Results (from the past 720 hour(s))  Urine culture     Status: Abnormal   Collection Time: 06/19/15 10:54 AM  Result Value Ref Range Status   Specimen Description URINE, CLEAN CATCH  Final   Special Requests Normal  Final   Culture (A)  Final    >=100,000 COLONIES/mL AEROCOCCUS URINAE Standardized susceptibility testing for this organism is not available. Performed at [facility]    Report Status 06/22/2015 FINAL  Final  Urine culture     Status: None   Collection Time: 07/10/15 12:01 AM  Result Value Ref Range Status   Specimen Description URINE, CLEAN CATCH  Final   Special Requests NONE  Final   Culture   Final    NO GROWTH 2 DAYS Performed at [facility]    Report Status 07/12/2015 FINAL  Final  Culture, blood (Routine X 2) w Reflex to ID Panel     Status: None (Preliminary result)   Collection Time: 07/10/15 12:07 AM  Result Value Ref Range Status   Specimen  Description BLOOD RIGHT ANTECUBITAL  Final   Special Requests BOTTLES DRAWN AEROBIC AND ANAEROBIC 6CC  Final   Culture NO GROWTH 3 DAYS  Final   Report Status PENDING  Incomplete  Culture, blood (Routine X 2) w Reflex to ID Panel     Status: None (Preliminary result)   Collection Time: 07/10/15 12:14 AM  Result Value Ref Range Status   Specimen Description BLOOD RIGHT ARM  Final   Special Requests BOTTLES DRAWN AEROBIC ONLY 5CC  Final   Culture NO GROWTH 3 DAYS  Final   Report Status PENDING  Incomplete  C difficile quick scan w PCR reflex     Status: Abnormal   Collection Time: 07/26/2015  2:50 AM  Result Value Ref Range Status   C Diff antigen POSITIVE (A) NEGATIVE Final    Comment: CRITICAL RESULT CALLED TO, READ BACK BY AND VERIFIED WITH: KRISTI THOMAS RN ON [id number] AT 0700 BY RESSEGGER R    C Diff toxin POSITIVE (A) NEGATIVE Final    Comment: CRITICAL RESULT CALLED TO, READ BACK BY AND VERIFIED WITH: Deeann Dowse RN ON [id number] AT 0700 BY RESSEGGER R    C Diff interpretation VALID  Final  Gastrointestinal Panel by PCR , Stool     Status: None   Collection Time: 07/20/2015  2:50 AM  Result Value Ref Range Status  Campylobacter species NOT DETECTED NOT DETECTED Final   Plesimonas shigelloides NOT DETECTED NOT DETECTED Final   Salmonella species NOT DETECTED NOT DETECTED Final   Yersinia enterocolitica NOT DETECTED NOT DETECTED Final   Vibrio species NOT DETECTED NOT DETECTED Final   Vibrio cholerae NOT DETECTED NOT DETECTED Final   Enteroaggregative E coli (EAEC) NOT DETECTED NOT DETECTED Final   Enteropathogenic E coli (EPEC) NOT DETECTED NOT DETECTED Final   Enterotoxigenic E coli (ETEC) NOT DETECTED NOT DETECTED Final   Shiga like toxin producing E coli (STEC) NOT DETECTED NOT DETECTED Final   E. coli O157 NOT DETECTED NOT DETECTED Final   Shigella/Enteroinvasive E coli (EIEC) NOT DETECTED NOT DETECTED Final   Cryptosporidium NOT DETECTED NOT DETECTED Final   Cyclospora  cayetanensis NOT DETECTED NOT DETECTED Final   Entamoeba histolytica NOT DETECTED NOT DETECTED Final   Giardia lamblia NOT DETECTED NOT DETECTED Final   Adenovirus F40/41 NOT DETECTED NOT DETECTED Final   Astrovirus NOT DETECTED NOT DETECTED Final   Norovirus GI/GII NOT DETECTED NOT DETECTED Final   Rotavirus A NOT DETECTED NOT DETECTED Final   Sapovirus (I, II, IV, and V) NOT DETECTED NOT DETECTED Final  KOH prep     Status: None   Collection Time: 07/15/2015 12:21 PM  Result Value Ref Range Status   Specimen Description ESOPHAGUS BRUSHING  Final   Special Requests NONE  Final   KOH Prep   Final    MODERATE YEAST WITH PSEUDOHYPHAE Performed at [facility]    Report Status 07/07/2015 FINAL  Final  MRSA PCR Screening     Status: None   Collection Time: 07/23/2015  1:30 PM  Result Value Ref Range Status   MRSA by PCR NEGATIVE NEGATIVE Final    Comment:        The GeneXpert MRSA Assay (FDA approved for NASAL specimens only), is one component of a comprehensive MRSA colonization surveillance program. It is not intended to diagnose MRSA infection nor to guide or monitor treatment for MRSA infections.    Medical History: Past Medical History  Diagnosis Date  . Hyperlipidemia   . Osteoporosis   . Pneumonia 5/13  . GERD (gastroesophageal reflux disease)   . History of migraine headaches   . Vertigo   . History of tuberculosis 1965  . Thyroid disease 2008    thyroid cancer cured with surgery  . Hypertension   . Diabetes mellitus     controlled with medication  . Parkinson's disease ([place])   . CA - cancer     thyroid - 2008 surg  . Lung cancer ([place]) 09/04/2011    non-small cell ca in L upper lobe  . Cancer ([place])     cervical ca - 2005 - surg and radiation  . Hypothyroidism   . Thyroid disease   . Diabetes mellitus without complication ([place])   . Parkinson disease ([place])   . Dementia   . Tuberculosis    Anti-infectives    Start     Dose/Rate Route Frequency Ordered  Stop   07/15/15 0800  fluconazole (DIFLUCAN) IVPB 50 mg     50 mg 25 mL/hr over 60 Minutes Intravenous Every 24 hours 08-01-2015 1013     07/15/15 0000  fluconazole (DIFLUCAN) IVPB 100 mg  Status:  Discontinued     100 mg 50 mL/hr over 60 Minutes Intravenous Every 24 hours 01-Aug-2015 0713 08-01-2015 1013   2015/08/01 1030  metroNIDAZOLE (FLAGYL) IVPB 500 mg     500  mg 100 mL/hr over 60 Minutes Intravenous Every 8 hours 07-25-2015 1023     07-25-2015 0715  fluconazole (DIFLUCAN) IVPB 200 mg     200 mg 100 mL/hr over 60 Minutes Intravenous  Once Jul 25, 2015 0713 2015/07/25 0939   07/25/2015 1000  vancomycin (VANCOCIN) 50 mg/mL oral solution 125 mg     125 mg Oral 4 times daily 07/26/2015 0926 07/27/15 0959   07/10/15 1800  vancomycin (VANCOCIN) 500 mg in sodium chloride 0.9 % 100 mL IVPB  Status:  Discontinued     500 mg 100 mL/hr over 60 Minutes Intravenous Every 24 hours 07/10/15 0756 07/10/2015 0908   07/10/15 1000  aztreonam (AZACTAM) 1 g in dextrose 5 % 50 mL IVPB     1 g 100 mL/hr over 30 Minutes Intravenous Every 12 hours 07/10/15 0755     07/10/15 0000  aztreonam (AZACTAM) 1 g in dextrose 5 % 50 mL IVPB     1 g 100 mL/hr over 30 Minutes Intravenous  Once 07/06/2015 2336 07/10/15 0145   07/23/2015 2345  vancomycin (VANCOCIN) 500 mg in sodium chloride 0.9 % 100 mL IVPB     500 mg 100 mL/hr over 60 Minutes Intravenous  Once 07/26/2015 2336 07/10/15 0107   07/20/2015 2315  levofloxacin (LEVAQUIN) IVPB 500 mg  Status:  Discontinued     500 mg 100 mL/hr over 60 Minutes Intravenous  Once 08/01/2015 2307 07/10/2015 2318     Assessment: 82 yo female with past history sig for lung cancer seen in the ED with ongoing weakness x 2 weeks; chest congestion x 1 week, and 3 day hx of diarrhea, productive cough and difficulty breathing. Chest x-ray shows new left infiltrate and effusion since prior CT in April. Empiric antibiotics for pneumonia.  SCr elevated. Treatment deescalated to aztreonam now. Day #6 of Aztreonam. Patient  also + cdiff, on vancomycin po and flagyl IV started today. Patient has had some GI bleeding, which she was transfused and upper endoscopy completed. UGI showed candidiasis of esophagus, no ulcers or bleeding. Suspect source of bleeding is colonic or small bowel. Hgb is stable now at 11. Diflucan started with loading dose. Will adjust maintenance dose, d/t  Crcl 55ms/min  Goal of Therapy:  Eradicate infection  Plan:  Aztreonam 1gm IV q12hrs Monitor labs, renal fxn, progress and c/s Deescalate ABX when improved / appropriate.   Diflucan maintenance dose '50mg'$  IV q24h  LIsac Sarna BS [place] BCPS Clinical Pharmacist Pager #[phone] 23, 201710:34 AM

## 2015-08-03 NOTE — Progress Notes (Signed)
Pharmacy Antibiotic Note  Krista [name] is a 82 y.o. female admitted on 07/06/2015 with sepsis.  Pharmacy has been consulted for Vancomycin dosing. Abdominal xray 5/12 shows possible SBO  Plan: Vancomycin 1gm loading dose then '500mg'$  IV every 48 hours.  Goal trough 15-20 mcg/mL.  Height: '5\' 2"'$  (157.5 cm) Weight: 98 lb 1.7 oz (44.5 kg) IBW/kg (Calculated) : 50.1  Temp (24hrs), Avg:97.6 F (36.4 C), Min:97.3 F (36.3 C), Max:97.8 F (36.6 C)   Recent Labs Lab 08/02/2015 2142 07/22/2015 2151 07/10/15 0541 07/03/2015 0620 07/27/2015 0915 07-30-2015 0456 Jul 30, 2015 0709 07-30-2015 1908  WBC 4.5  --  4.4  --  8.1  --  16.9*  --   CREATININE 1.46*  --  1.55* 1.65*  --  2.70*  --   --   LATICACIDVEN  --  1.33  --   --   --   --   --  4.6*    Estimated Creatinine Clearance: 11.3 mL/min (by C-G formula based on Cr of 2.7).    Allergies  Allergen Reactions  . Amoxicillin Nausea Only  . Statins Nausea And Vomiting  . Sulfa Antibiotics Itching  . Sulfonamide Derivatives Nausea And Vomiting    Antimicrobials this admission: Vancomycin 5/7> 5/10 restarted 5/12>> Aztreonam 5/7 >>  Metronidazole 5/12>> Vancomycin PO 5/11>>5/12 Vancomycin enemas 5/12>> Fluconazole 5/12>>  Dose adjustments this admission: Diflucan  Microbiology results: 5/8 BCx: ngtd 5/12  Bcx: pending 5/11 MRSA PCR: negative  Thank you for allowing pharmacy to be a part of this patient's care.  Isac Sarna, BS [place], [place] Clinical Pharmacist Pager [phone]  07/30/2015 8:39 PM

## 2015-08-03 NOTE — Progress Notes (Signed)
  Calcium 6.2 mg/dl  Date of notification: Jul 30, 2015  MD notified (1st page): Dr. Cindie Laroche  Time of first page: Phone call  Responding MD: Dr. Cindie Laroche  Time MD responded: 1901  No new orders received at this time.

## 2015-08-03 NOTE — Progress Notes (Signed)
Dr. Luan Pulling made aware pt's blood pressure has been running low, SBP 60-80, MAP < 60. New order received for NS bolus x 1, pt's pressure remains low and patient is tachycardic, HR 100-140 bpm. MD made aware, order for additional 1 L NS bolus obtained. Will administer and continue to monitor.

## 2015-08-03 NOTE — Care Management Important Message (Signed)
Important Message  Patient Details  Name: MARLIE KUENNEN MRN: [id number] Date of Birth: 02/21/1932   Medicare Important Message Given:  Yes    Sherald Barge, RN 07-27-2015, 2:35 PM

## 2015-08-03 NOTE — Progress Notes (Signed)
Patient is actively passing. She appears comfortable on 100% nrb . Saturation is 55 BP 40"S

## 2015-08-03 NOTE — Progress Notes (Signed)
Subjective:  Patient lying with eyes opened. Does not verbally communicate. According to nursing staff she had several maroon color stools overnight but none this morning since 7 AM. 10 BMs noted over the past 24 hours. No family present at this time.  Objective: Vital signs in last 24 hours: Temp:  [97.3 F (36.3 C)-98.4 F (36.9 C)] 97.3 F (36.3 C) (05/12 0400) Pulse Rate:  [91-101] 91 (05/11 1500) Resp:  [20-31] 30 (05/12 0600) BP: (65-116)/(44-81) 116/81 mmHg (05/12 0600) SpO2:  [87 %-100 %] 99 % (05/12 0742) Weight:  [98 lb 1.7 oz (44.5 kg)] 98 lb 1.7 oz (44.5 kg) (05/12 0500) Last BM Date: 07/04/2015 General:   Alert,  elderly, nonverbal. Head:  Normocephalic and atraumatic. Eyes:  Sclera clear, no icterus.  Abdomen:  Soft, slight distention, + diffuse tenderness noted by grimacing. No masses, hepatosplenomegaly or hernias noted. Possible scant bowel sounds, without guarding, and without rebound.   Extremities:  Without clubbing, deformity or edema. Neurologic:  Alert and  oriented x4;  grossly normal neurologically. Skin:  Intact without significant lesions or rashes. Psych:  Alert and cooperative. Normal mood and affect.  Intake/Output from previous day: 05/11 0701 - 05/12 0700 In: 2255 [P.O.:120; I.V.:1410; Blood:325; IV Piggyback:400] Out: -  Intake/Output this shift:    Lab Results: CBC  Recent Labs  08/01/2015 0915 07/16/2015 1715 08-11-2015 0709  WBC 8.1  --  16.9*  HGB 8.6* 11.0* 11.1*  HCT 26.4* 33.2* 33.5*  MCV 83.0  --  83.1  PLT 250  --  130*   BMET  Recent Labs  07/05/2015 0620 08-11-2015 0456  NA 141 138  K 4.0 4.3  CL 108 112*  CO2 22 12*  GLUCOSE 190* 136*  BUN 81* 102*  CREATININE 1.65* 2.70*  CALCIUM 7.7* 6.2*   LFTs No results for input(s): BILITOT, BILIDIR, IBILI, ALKPHOS, AST, ALT, PROT, ALBUMIN in the last 72 hours. No results for input(s): LIPASE in the last 72 hours. PT/INR No results for input(s): LABPROT, INR in the last 72 hours.     Imaging Studies: Ct Abdomen Pelvis Wo Contrast  06/26/2015  ADDENDUM REPORT: 06/26/2015 20:17 ADDENDUM: Patient had lumbar spine radiographs 06/19/2015 under a different medical record number. These have been reviewed. The fracture at T12 (counted from above and based on prior studies) is the same fracture described on the recent radiographs. This fracture has mildly progressed over this 1 week interval. This was discussed with Dr. Roderic Palau. Electronically Signed   By: Richardean Sale M.D.   On: 06/26/2015 20:17  06/26/2015  CLINICAL DATA:  Mid abdominal pain with vomiting for several days. History of compression fracture and thyroid cancer. EXAM: CT ABDOMEN AND PELVIS WITHOUT CONTRAST TECHNIQUE: Multidetector CT imaging of the abdomen and pelvis was performed following the standard protocol without IV contrast. COMPARISON:  CT 06/23/2014.  Chest CT 06/14/2015. FINDINGS: Lower chest: The visualized left lung base appears stable with radiation changes and bronchiectasis. Subpleural nodule in the right lower lobe measures 14 x 8 mm on the current examination, similar to recent prior studies, although enlarged from PET-CT of 2013. There is a small hiatal hernia. Hepatobiliary: The liver appears unremarkable as imaged in the noncontrast state. No biliary dilatation status post cholecystectomy. Pancreas: Stable ill-defined low-density in the uncinate process (image 28). No evidence of pancreatic ductal dilatation or surrounding inflammation. Spleen: Normal in size without focal abnormality. There is a small amount of perisplenic fluid which appears new. Adrenals/Urinary Tract: The adrenal glands appear unchanged.  A 6 cm mid right renal cyst is unchanged. There is a punctate nonobstructing calculus in the lower pole the right kidney which is stable. No evidence of ureteral or bladder calculus. No hydronephrosis. The bladder is partly obscured by artifact from the left hip arthroplasty, although demonstrates no  significant findings. Stomach/Bowel: Interval development of multiple dilated proximal and mid small bowel loops, measuring up to 5.4 cm in diameter. Gradual transition point appears to be in the right lower quadrant. The terminal ileum is decompressed. There is stool throughout the colon. There is a small amount of mesenteric and interloop fluid, but no focal fluid collection. Vascular/Lymphatic: There are no enlarged abdominal or pelvic lymph nodes. There are extensive postsurgical changes in the pelvis from previous pelvic lymphadenectomy. Moderate aortoiliac atherosclerosis again noted. No acute vascular findings seen on noncontrast imaging. Reproductive: Hysterectomy.  No evidence of adnexal mass. Other: No evidence of abdominal wall mass or hernia. Progressive left hemidiaphragm elevation noted. Musculoskeletal: Since chest CT of 2 weeks ago, the patient has developed a burst fracture at T12 resulting in 70% loss vertebral body height and 8 mm of osseous retropulsion. There is mass effect on the thecal sac. Inferior endplate compression deformity at L5 appears unchanged. Previous left hip arthroplasty. IMPRESSION: 1. Interval development of distal small bowel obstruction with multiple dilated loops of small bowel. Specific etiology not delineated, although transition appears to be in the right lower quadrant, probably from adhesions. 2. Development of T12 burst fracture compared with chest CT of 2 weeks ago. There is associated osseous retropulsion and mass effect on the thecal sac. 3. Mild mesenteric edema and ascites without focal extraluminal fluid collection. 4. Right lower lobe subpleural nodule is stable from recent studies, although has enlarged from 2013 and 2014. Continued attention on follow-up recommended. Electronically Signed: By: Richardean Sale M.D. On: 06/26/2015 18:47   Dg Lumbar Spine Complete  06/19/2015  CLINICAL DATA:  Status post fall last night with a low back injury and pain. Initial  encounter. EXAM: LUMBAR SPINE - COMPLETE 4+ VIEW COMPARISON:  None. FINDINGS: The patient appears to have transitional anatomy at the lumbosacral junction with 6 lumbar type vertebral bodies identified. There is a compression fracture of L1 which is age indeterminate but appears acute or subacute. Vertebral body height is otherwise maintained. Loss of disc space height is most notable at L5-6 and L6-S1. There is facet degenerative disease at these levels. Paraspinous structures demonstrate multiple surgical clips in the pelvis a left hip replacement. Large stool burden through the colon is noted. IMPRESSION: Transitional lumbosacral anatomy. The patient appears to have 6 lumbar type vertebral bodies. L1 compression fracture cannot be definitively characterized but appears acute or subacute. Lower lumbar spondylosis. Large colonic stool burden. Electronically Signed   By: Inge Rise M.D.   On: 06/19/2015 10:43   Ct Chest Wo Contrast  06/14/2015  CLINICAL DATA:  Lung cancer, chronic productive cough. EXAM: CT CHEST WITHOUT CONTRAST TECHNIQUE: Multidetector CT imaging of the chest was performed following the standard protocol without IV contrast. COMPARISON:  12/19/2014. FINDINGS: Mediastinum/Nodes: Thyroidectomy. Mediastinal lymph nodes are not enlarged by CT size criteria. Hilar regions are difficult to definitively evaluate without IV contrast. No axillary adenopathy. Atherosclerotic calcification of the arterial vasculature, including coronary arteries. Heart is enlarged. Small pericardial effusion. Lungs/Pleura: Biapical pleural parenchymal scarring. Scattered calcified granulomas. Likely postinfectious peribronchovascular nodularity in the apical segment right upper lobe, stable. Subpleural nodule in the right lower lobe measures approximately 10 x 7 mm but  there is respiratory motion, degrading image quality. Post treatment volume loss and bronchiectasis in the left perihilar region and left lower lobe,  stable. No pleural fluid. Debris is seen dependently in the upper trachea. Upper abdomen: Visualized portions of the liver and adrenal glands are unremarkable. Low-attenuation lesion off the interpolar right kidney measures at least 5.1 cm, incompletely imaged, similar. Visualized portions of the left kidney, spleen, pancreas, stomach and bowel are grossly unremarkable. Musculoskeletal: No worrisome lytic or sclerotic lesions. IMPRESSION: 1. Subpleural right lower lobe nodule is stable from recent prior examinations but is enlarged from 12/16/2012. Continued attention on followup exams is warranted. 2. Coronary artery calcification. Electronically Signed   By: Lorin Picket M.D.   On: 06/14/2015 12:44   Dg Chest Portable 1 View  08/01/2015  CLINICAL DATA:  Productive cough for 1 week. EXAM: PORTABLE CHEST 1 VIEW COMPARISON:  06/14/2015 CT 06/23/2014 radiographs FINDINGS: Left lower lung opacity representing atelectasis versus airspace disease noted with small left pleural effusion. There is no evidence of pneumothorax. Cardiomediastinal silhouette is otherwise unchanged. Surgical clips overlying the lower neck/ upper chest noted. IMPRESSION: Left lower lung opacity representing atelectasis and/or pneumonia. Small left pleural effusion. Electronically Signed   By: Margarette Canada M.D.   On: 07/08/2015 21:20  [2 weeks]   Assessment: 82 year old female with pneumonia, GI bleeding and C. difficile. Transfer to the ICU yesterday after EGD due to tenuous blood pressures. On EGD she was noted to have Candida esophagitis, confirmed by KOH. She is on IV Diflucan at this time. Retained gastric contents, erythematous gastric mucosa in the stomach, deformed pylorus but no ulcer. No blood in the upper GI tract. All mucosal surfaces not seen due to retained food. Suspect a colonic or small bowel origin for bleeding. Patient not felt fit for further invasive testing such as colonoscopy. With coexisting C. difficile, she has  been on oral vancomycin which for the most part she has taken. This morning she has very little if any bowel sounds, mild distention some tenderness noted.  Her hemoglobin has been stable at 11, she did receive 1 unit of packed red blood cells yesterday. Her white cell count is up to 16,900 in the setting of Solu-Medrol as well. BUN and Creatinine rising. Her blood pressures remain low with systolic in systole in the 60-80 range. Heart rate in the 100 range.  Plan: 1. Add IV Flagyl for increased coverage for C. Difficile. 2. Agree with decreasing IV Protonix dose. 3. Continue IV Diflucan. 4. Continue oral vancomycin. 5. Plain portable abdominal film today. 6. Discussed patient's status with Dr. Oneida Alar who will address as well.  Laureen Ochs. Bernarda Caffey [facility] [phone] 05/13/201710:19 AM     LOS: 5 days

## 2015-08-03 NOTE — Discharge Summary (Signed)
Physician Discharge Summary  Patient ID: Krista [name] MRN: [id number] DOB/AGE: [birth year] 80 y.o. Primary Care Physician:Akeelah Seppala L, MD Admit date: 07/11/2015 Discharge date: 07/15/2015    Discharge Diagnoses:   Active Problems:   Diabetes mellitus without complication (HCC)   Essential hypertension   THYROID CANCER, HX OF   TUBERCULOSIS, HX OF   GERD (gastroesophageal reflux disease)   Postsurgical hypothyroidism   Parkinson's disease (HCC)   Dementia   HCAP (healthcare-associated pneumonia)   Bleeding gastrointestinal   Dysphagia   Abdominal distension   Gastrointestinal hemorrhage with melena   Candida esophagitis (HCC)   Acute blood loss anemia   Clostridium difficile colitis     Medication List    ASK your doctor about these medications        aspirin EC 81 MG tablet  Take 81 mg by mouth daily.     carbidopa-levodopa 25-100 MG tablet  Commonly known as:  SINEMET IR  Take 1 tablet by mouth 3 (three) times daily.     dextromethorphan-guaiFENesin 30-600 MG 12hr tablet  Commonly known as:  MUCINEX DM  Take 1 tablet by mouth 2 (two) times daily as needed for cough.     ketorolac 0.5 % ophthalmic solution  Commonly known as:  ACULAR  Place 1 drop into both eyes 2 (two) times daily.     levothyroxine 75 MCG tablet  Commonly known as:  SYNTHROID, LEVOTHROID  TAKE 1 TABLET BY MOUTH DAILY BEFORE BREAKFAST.     loperamide 2 MG capsule  Commonly known as:  IMODIUM  Take 2 mg by mouth as needed for diarrhea or loose stools.     meclizine 25 MG tablet  Commonly known as:  ANTIVERT  Take 25 mg by mouth 3 (three) times daily as needed for dizziness.     ondansetron 8 MG disintegrating tablet  Commonly known as:  ZOFRAN ODT  Take 0.5 tablets (4 mg total) by mouth every 8 (eight) hours as needed for nausea or vomiting.     QUEtiapine 25 MG tablet  Commonly known as:  SEROQUEL  Take 2 tablets by mouth at bedtime.     rivastigmine 1.5 MG capsule  Commonly  known as:  EXELON  Take 1.5 mg by mouth at bedtime.     rosuvastatin 20 MG tablet  Commonly known as:  CRESTOR  Take 20 mg by mouth every evening.     TRADJENTA 5 MG Tabs tablet  Generic drug:  linagliptin  TAKE (1) TABLET BY MOUTH EACH MORNING.     VITAMIN B 12 PO  Take 500 mcg by mouth daily.        Discharged Condition:Deceased    Consults: GI  Significant Diagnostic Studies: Ct Abdomen Pelvis Wo Contrast  06/26/2015  ADDENDUM REPORT: 06/26/2015 20:17 ADDENDUM: Patient had lumbar spine radiographs 06/19/2015 under a different medical record number. These have been reviewed. The fracture at T12 (counted from above and based on prior studies) is the same fracture described on the recent radiographs. This fracture has mildly progressed over this 1 week interval. This was discussed with Dr. Roderic Palau. Electronically Signed   By: Richardean Sale M.D.   On: 06/26/2015 20:17  06/26/2015  CLINICAL DATA:  Mid abdominal pain with vomiting for several days. History of compression fracture and thyroid cancer. EXAM: CT ABDOMEN AND PELVIS WITHOUT CONTRAST TECHNIQUE: Multidetector CT imaging of the abdomen and pelvis was performed following the standard protocol without IV contrast. COMPARISON:  CT 06/23/2014.  Chest CT 06/14/2015. FINDINGS: Lower  chest: The visualized left lung base appears stable with radiation changes and bronchiectasis. Subpleural nodule in the right lower lobe measures 14 x 8 mm on the current examination, similar to recent prior studies, although enlarged from PET-CT of 2013. There is a small hiatal hernia. Hepatobiliary: The liver appears unremarkable as imaged in the noncontrast state. No biliary dilatation status post cholecystectomy. Pancreas: Stable ill-defined low-density in the uncinate process (image 28). No evidence of pancreatic ductal dilatation or surrounding inflammation. Spleen: Normal in size without focal abnormality. There is a small amount of perisplenic fluid  which appears new. Adrenals/Urinary Tract: The adrenal glands appear unchanged. A 6 cm mid right renal cyst is unchanged. There is a punctate nonobstructing calculus in the lower pole the right kidney which is stable. No evidence of ureteral or bladder calculus. No hydronephrosis. The bladder is partly obscured by artifact from the left hip arthroplasty, although demonstrates no significant findings. Stomach/Bowel: Interval development of multiple dilated proximal and mid small bowel loops, measuring up to 5.4 cm in diameter. Gradual transition point appears to be in the right lower quadrant. The terminal ileum is decompressed. There is stool throughout the colon. There is a small amount of mesenteric and interloop fluid, but no focal fluid collection. Vascular/Lymphatic: There are no enlarged abdominal or pelvic lymph nodes. There are extensive postsurgical changes in the pelvis from previous pelvic lymphadenectomy. Moderate aortoiliac atherosclerosis again noted. No acute vascular findings seen on noncontrast imaging. Reproductive: Hysterectomy.  No evidence of adnexal mass. Other: No evidence of abdominal wall mass or hernia. Progressive left hemidiaphragm elevation noted. Musculoskeletal: Since chest CT of 2 weeks ago, the patient has developed a burst fracture at T12 resulting in 70% loss vertebral body height and 8 mm of osseous retropulsion. There is mass effect on the thecal sac. Inferior endplate compression deformity at L5 appears unchanged. Previous left hip arthroplasty. IMPRESSION: 1. Interval development of distal small bowel obstruction with multiple dilated loops of small bowel. Specific etiology not delineated, although transition appears to be in the right lower quadrant, probably from adhesions. 2. Development of T12 burst fracture compared with chest CT of 2 weeks ago. There is associated osseous retropulsion and mass effect on the thecal sac. 3. Mild mesenteric edema and ascites without focal  extraluminal fluid collection. 4. Right lower lobe subpleural nodule is stable from recent studies, although has enlarged from 2013 and 2014. Continued attention on follow-up recommended. Electronically Signed: By: Richardean Sale M.D. On: 06/26/2015 18:47   Dg Lumbar Spine Complete  06/19/2015  CLINICAL DATA:  Status post fall last night with a low back injury and pain. Initial encounter. EXAM: LUMBAR SPINE - COMPLETE 4+ VIEW COMPARISON:  None. FINDINGS: The patient appears to have transitional anatomy at the lumbosacral junction with 6 lumbar type vertebral bodies identified. There is a compression fracture of L1 which is age indeterminate but appears acute or subacute. Vertebral body height is otherwise maintained. Loss of disc space height is most notable at L5-6 and L6-S1. There is facet degenerative disease at these levels. Paraspinous structures demonstrate multiple surgical clips in the pelvis a left hip replacement. Large stool burden through the colon is noted. IMPRESSION: Transitional lumbosacral anatomy. The patient appears to have 6 lumbar type vertebral bodies. L1 compression fracture cannot be definitively characterized but appears acute or subacute. Lower lumbar spondylosis. Large colonic stool burden. Electronically Signed   By: Inge Rise M.D.   On: 06/19/2015 10:43   Dg Chest Portable 1 View  07/18/2015  CLINICAL DATA:  Productive cough for 1 week. EXAM: PORTABLE CHEST 1 VIEW COMPARISON:  06/14/2015 CT 06/23/2014 radiographs FINDINGS: Left lower lung opacity representing atelectasis versus airspace disease noted with small left pleural effusion. There is no evidence of pneumothorax. Cardiomediastinal silhouette is otherwise unchanged. Surgical clips overlying the lower neck/ upper chest noted. IMPRESSION: Left lower lung opacity representing atelectasis and/or pneumonia. Small left pleural effusion. Electronically Signed   By: Margarette Canada M.D.   On: 07/31/2015 21:20   Dg Abd Portable  2v  07/26/2015  CLINICAL DATA:  Abdominal distention EXAM: PORTABLE ABDOMEN - 2 VIEW COMPARISON:  CT abdomen and pelvis FINDINGS: Supine and upright images were obtained. There is generalized small bowel dilatation. No air-fluid levels. No free air. There are multiple surgical clips in the pelvis. There is a total hip prosthesis on the left. There is airspace consolidation in the left base region. IMPRESSION: Generalized small bowel dilatation with an appearance concerning for a degree of bowel obstruction. Note that a small amount of air seen in the rectum. No free air evident. Airspace consolidation left base region. Electronically Signed   By: Lowella Grip III M.D.   On: Jul 26, 2015 11:04    Lab Results: Basic Metabolic Panel:  Recent Labs  07/23/2015 0620 Jul 26, 2015 0456  NA 141 138  K 4.0 4.3  CL 108 112*  CO2 22 12*  GLUCOSE 190* 136*  BUN 81* 102*  CREATININE 1.65* 2.70*  CALCIUM 7.7* 6.2*   Liver Function Tests: No results for input(s): AST, ALT, ALKPHOS, BILITOT, PROT, ALBUMIN in the last 72 hours.   CBC:  Recent Labs  07/16/2015 0915  26-Jul-2015 0709 26-Jul-2015 1553  WBC 8.1  --  16.9*  --   NEUTROABS 7.6  --  16.1*  --   HGB 8.6*  < > 11.1* 10.5*  HCT 26.4*  < > 33.5* 31.8*  MCV 83.0  --  83.1  --   PLT 250  --  130*  --   < > = values in this interval not displayed.  Recent Results (from the past 240 hour(s))  Urine culture     Status: None   Collection Time: 07/10/15 12:01 AM  Result Value Ref Range Status   Specimen Description URINE, CLEAN CATCH  Final   Special Requests NONE  Final   Culture   Final    NO GROWTH 2 DAYS Performed at [facility]    Report Status 07/12/2015 FINAL  Final  Culture, blood (Routine X 2) w Reflex to ID Panel     Status: None (Preliminary result)   Collection Time: 07/10/15 12:07 AM  Result Value Ref Range Status   Specimen Description BLOOD RIGHT ANTECUBITAL  Final   Special Requests BOTTLES DRAWN AEROBIC AND ANAEROBIC  6CC  Final   Culture NO GROWTH 3 DAYS  Final   Report Status PENDING  Incomplete  Culture, blood (Routine X 2) w Reflex to ID Panel     Status: None (Preliminary result)   Collection Time: 07/10/15 12:14 AM  Result Value Ref Range Status   Specimen Description BLOOD RIGHT ARM  Final   Special Requests BOTTLES DRAWN AEROBIC ONLY 5CC  Final   Culture NO GROWTH 3 DAYS  Final   Report Status PENDING  Incomplete  C difficile quick scan w PCR reflex     Status: Abnormal   Collection Time: 07/20/2015  2:50 AM  Result Value Ref Range Status   C Diff antigen POSITIVE (A) NEGATIVE Final  Comment: CRITICAL RESULT CALLED TO, READ BACK BY AND VERIFIED WITH: KRISTI THOMAS RN ON [id number] AT 0700 BY RESSEGGER R    C Diff toxin POSITIVE (A) NEGATIVE Final    Comment: CRITICAL RESULT CALLED TO, READ BACK BY AND VERIFIED WITH: Deeann Dowse RN ON [id number] AT 0700 BY RESSEGGER R    C Diff interpretation VALID  Final  Gastrointestinal Panel by PCR , Stool     Status: None   Collection Time: 07/21/2015  2:50 AM  Result Value Ref Range Status   Campylobacter species NOT DETECTED NOT DETECTED Final   Plesimonas shigelloides NOT DETECTED NOT DETECTED Final   Salmonella species NOT DETECTED NOT DETECTED Final   Yersinia enterocolitica NOT DETECTED NOT DETECTED Final   Vibrio species NOT DETECTED NOT DETECTED Final   Vibrio cholerae NOT DETECTED NOT DETECTED Final   Enteroaggregative E coli (EAEC) NOT DETECTED NOT DETECTED Final   Enteropathogenic E coli (EPEC) NOT DETECTED NOT DETECTED Final   Enterotoxigenic E coli (ETEC) NOT DETECTED NOT DETECTED Final   Shiga like toxin producing E coli (STEC) NOT DETECTED NOT DETECTED Final   E. coli O157 NOT DETECTED NOT DETECTED Final   Shigella/Enteroinvasive E coli (EIEC) NOT DETECTED NOT DETECTED Final   Cryptosporidium NOT DETECTED NOT DETECTED Final   Cyclospora cayetanensis NOT DETECTED NOT DETECTED Final   Entamoeba histolytica NOT DETECTED NOT DETECTED Final    Giardia lamblia NOT DETECTED NOT DETECTED Final   Adenovirus F40/41 NOT DETECTED NOT DETECTED Final   Astrovirus NOT DETECTED NOT DETECTED Final   Norovirus GI/GII NOT DETECTED NOT DETECTED Final   Rotavirus A NOT DETECTED NOT DETECTED Final   Sapovirus (I, II, IV, and V) NOT DETECTED NOT DETECTED Final  KOH prep     Status: None   Collection Time: 07/07/2015 12:21 PM  Result Value Ref Range Status   Specimen Description ESOPHAGUS BRUSHING  Final   Special Requests NONE  Final   KOH Prep   Final    MODERATE YEAST WITH PSEUDOHYPHAE Performed at [facility]    Report Status 07/11/2015 FINAL  Final  MRSA PCR Screening     Status: None   Collection Time: 07/15/2015  1:30 PM  Result Value Ref Range Status   MRSA by PCR NEGATIVE NEGATIVE Final    Comment:        The GeneXpert MRSA Assay (FDA approved for NASAL specimens only), is one component of a comprehensive MRSA colonization surveillance program. It is not intended to diagnose MRSA infection nor to guide or monitor treatment for MRSA infections.   Culture, blood (routine x 2)     Status: None (Preliminary result)   Collection Time: 07-30-2015  7:08 PM  Result Value Ref Range Status   Specimen Description LEFT ANTECUBITAL  Final   Special Requests BOTTLES DRAWN AEROBIC ONLY 4CC EACH  Final   Culture PENDING  Incomplete   Report Status PENDING  Incomplete  Culture, blood (routine x 2)     Status: None (Preliminary result)   Collection Time: 2015-07-30  7:17 PM  Result Value Ref Range Status   Specimen Description LEFT ANTECUBITAL  Final   Special Requests BOTTLES DRAWN AEROBIC ONLY [place]  Final   Culture PENDING  Incomplete   Report Status PENDING  Incomplete     Hospital Course: This is an 82 year old with multiple medical problems as listed who came to the emergency department because of shortness of breath. She was found to have healthcare associated pneumonia.  She had been in the hospital fairly recently with what  seemed to be small bowel obstruction. She was improving from the pneumonia but was complaining of dysphagia so GI consult was obtained. She had some diarrhea which initially resolved and then came back and she was positive for C. difficile when the diarrhea returned. She then developed maroon stools and she was already scheduled for EGD for dysphagia. She did not have a definite bleeding site seen but did have Candida esophagitis. She was transfused with packed red blood cells transferred to the intensive care unit and continued to deteriorate. Her blood pressure was low and responded intermittently to fluid boluses. She had DO NOT RESUSCITATE status but family did agree that they would allow the use of pressors. She continued having tachycardia increased respiratory rate low oxygen saturations received more fluid boluses but died with family at bedside at 2037  Discharge Exam: Blood pressure 39/22, pulse 44, temperature 97.8 F (36.6 C), temperature source Axillary, resp. rate 0, height '5\' 2"'$  (1.575 m), weight 44.5 kg (98 lb 1.7 oz), SpO2 76 %. Not applicable  Disposition: Released to funeral home      Signed: Nazarene Bunning L   07/15/2015, 9:38 AM

## 2015-08-03 NOTE — Progress Notes (Addendum)
Pt was worsening at the time I came onto the shift at 1900. BP and O2 Sat were consistently dropping. Family members were at the bedside. At 2037 pt went Asystole. No pulse and no breath sounds confirmed by both myself and R Wagoner. Family wanted to bathe patient so provided support to them. Family left about 2240 and funeral home arrived to get patient at 2245.

## 2015-08-03 DEATH — deceased

## 2015-08-11 ENCOUNTER — Ambulatory Visit: Payer: Medicare Other | Admitting: "Endocrinology

## 2015-09-02 NOTE — Discharge Summary (Signed)
NAME:  DELIGHT, BICKLE              ACCOUNT NO.:  [id number]  MEDICAL RECORD NO.:  [id number]  LOCATION:  IC05                          FACILITY:  [facility]  PHYSICIAN:  Marnie Fazzino L. Luan Pulling, M.D.DATE OF BIRTH:  09-13-1931  DATE OF ADMISSION:  07/21/2015 DATE OF DISCHARGE:  05/27/17LH                              DISCHARGE SUMMARY   ADDENDUM:  DISCHARGE DIAGNOSES:  Acute on chronic diastolic congestive heart failure.     Elicia Lui L. Luan Pulling, M.D.     EL[facility]/MEDQ  D:  08/03/2015  T:  08/04/2015  Job:  [id number]

## 2017-05-16 IMAGING — CT CT CHEST W/O CM
2 of 3 series · 15 of 36 positions shown, 18 images · non-contrast
Comparison: CT chest dated 06/08/2014

CLINICAL DATA: Left lung cancer diagnosed 9399 status post
radiation. Thyroid cancer diagnosed 1661 status post adjuvant [id number]
therapy in 0214. History of cervical cancer diagnosed 8883.

EXAM:
CT CHEST WITHOUT CONTRAST
TECHNIQUE: Multidetector CT imaging of the chest was performed following the
standard protocol without IV contrast.

[Series 2: chestroutine 5.0 b40f · axial · 0.56mm/px · z∈[-224,+6]mm · 12 of 54 slices shown, 15 images]
[im 4/54  mediastinal]
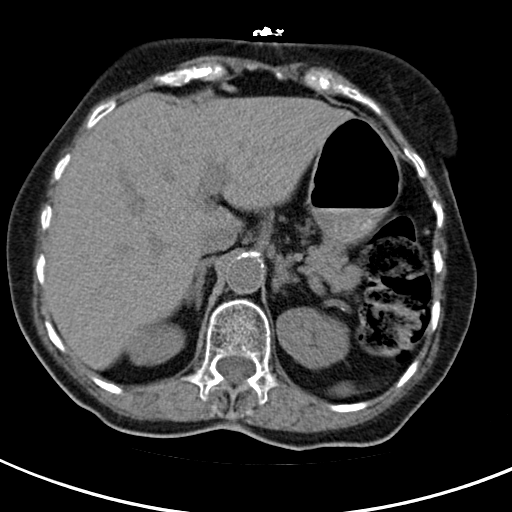
[im 4/54  lung]
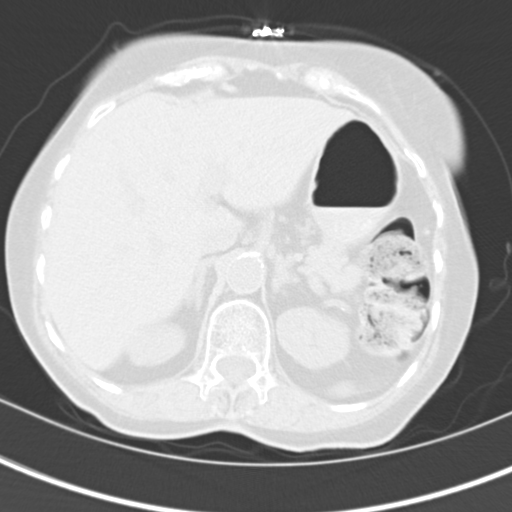
[im 8/54  lung]
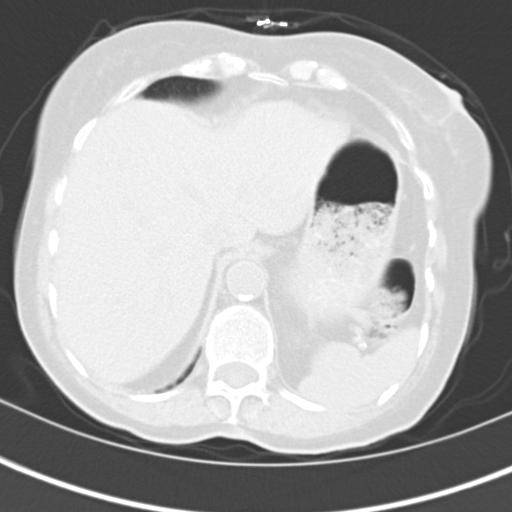
[im 12/54  lung]
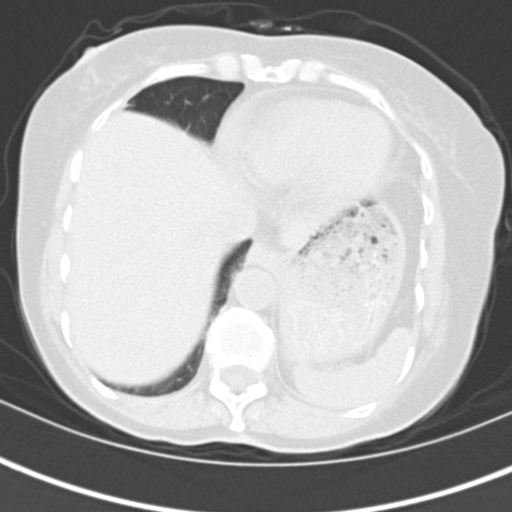
[im 16/54  lung]
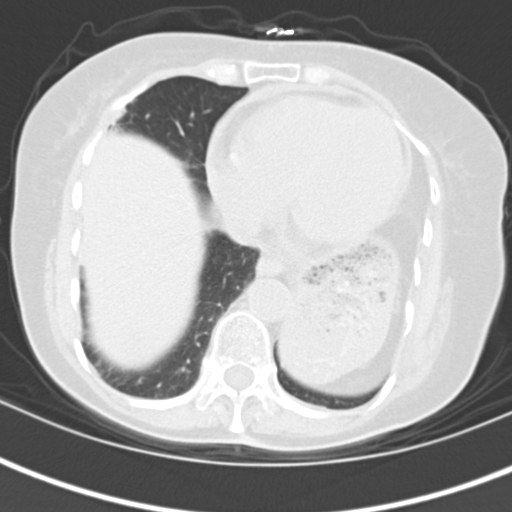
[im 20/54  mediastinal]
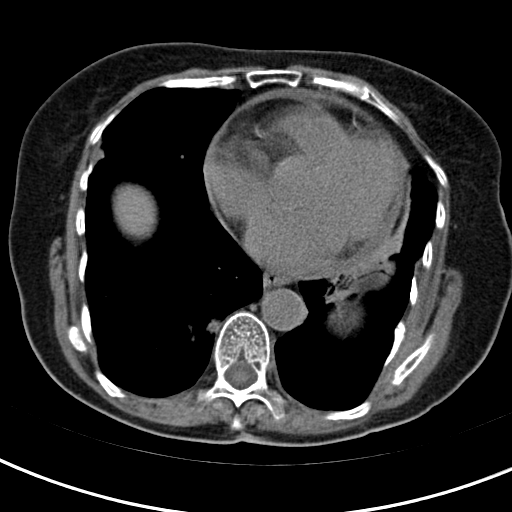
[im 20/54  lung]
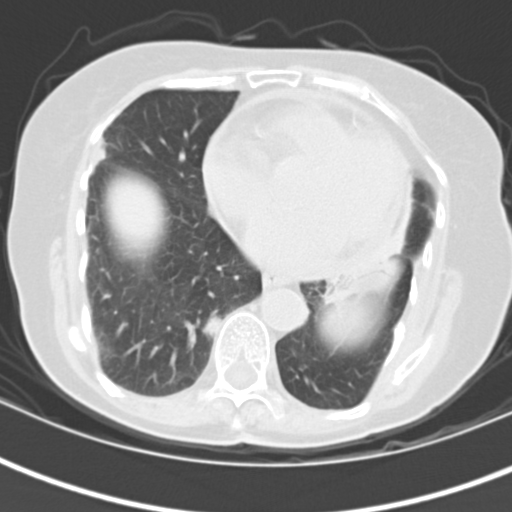
[im 24/54  lung]
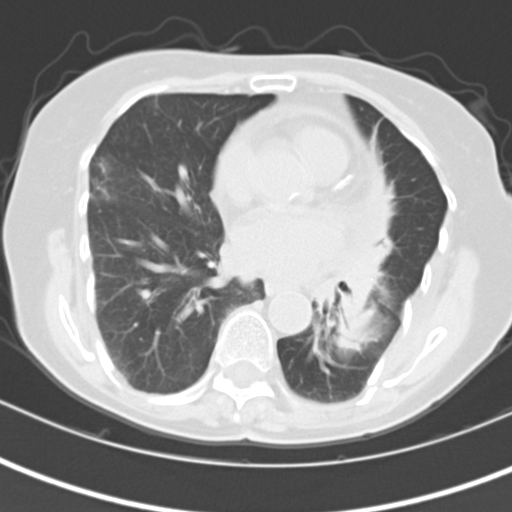
[im 30/54  lung]
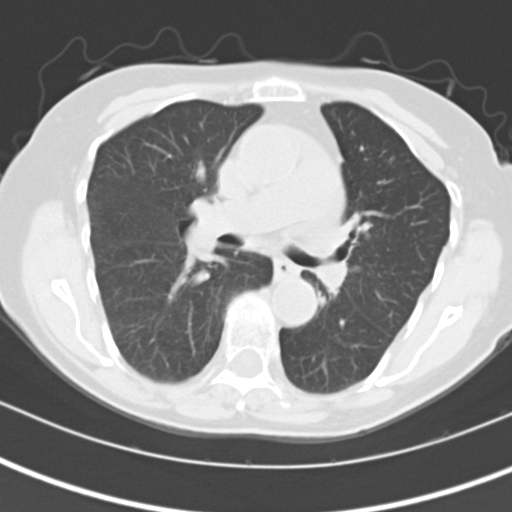
[im 34/54  lung]
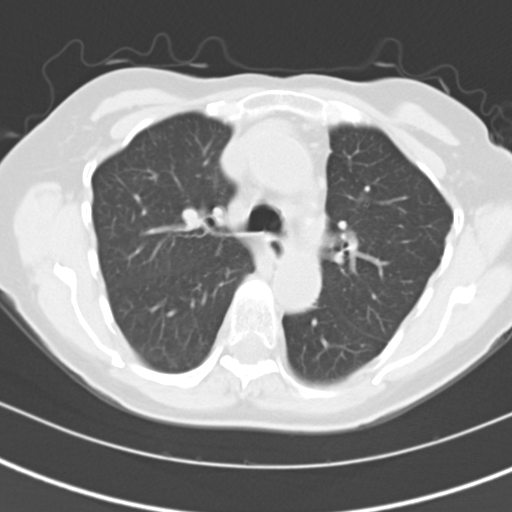
[im 38/54  mediastinal]
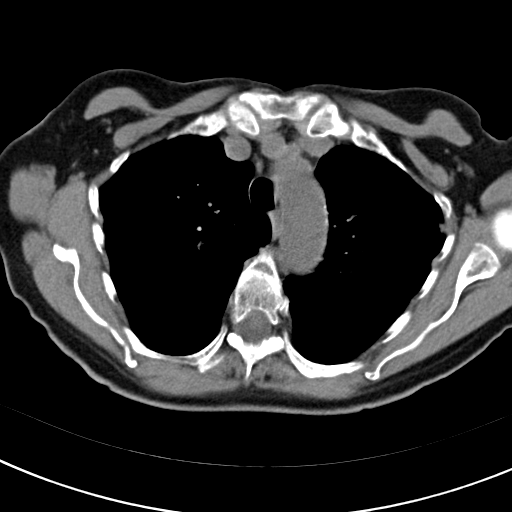
[im 38/54  lung]
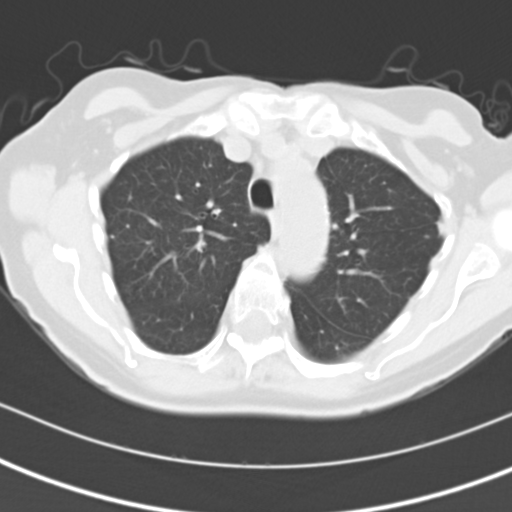
[im 42/54  lung]
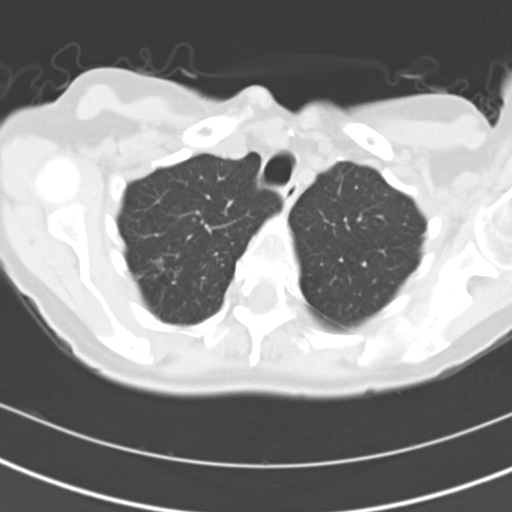
[im 46/54  lung]
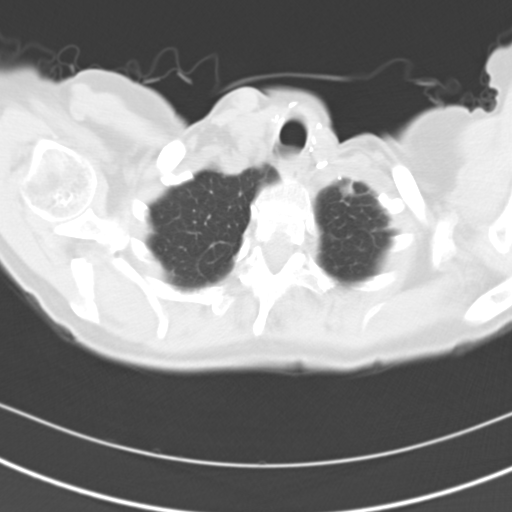
[im 50/54  lung]
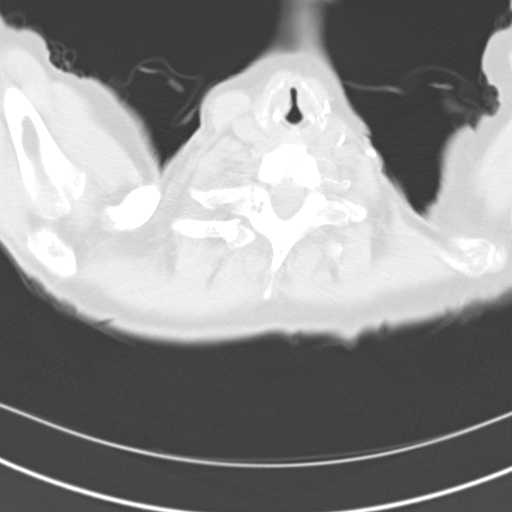

[Series 5: mpr coro 3mm · coronal · 0.54mm/px · 3 of 72 slices shown]
[im 15/72  lung]
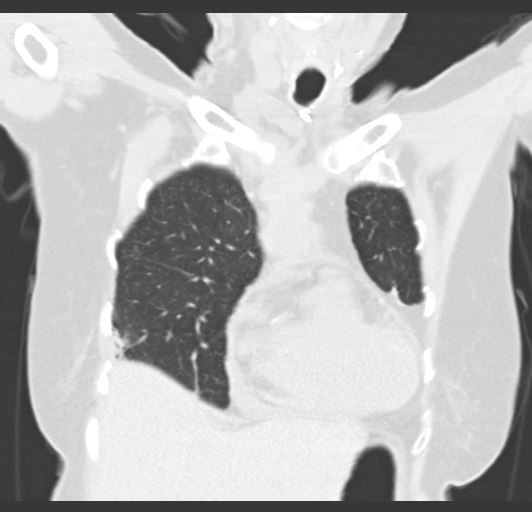
[im 29/72  lung]
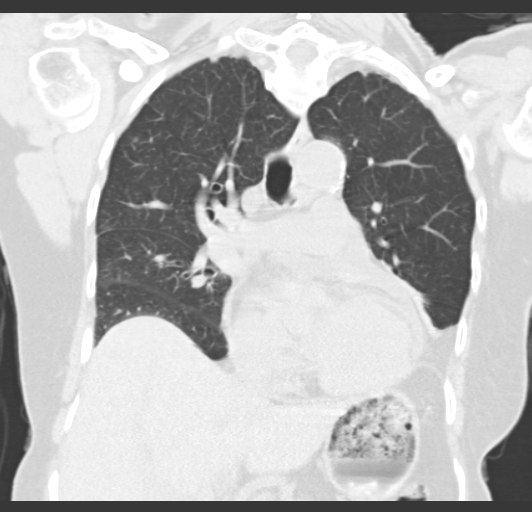
[im 43/72  lung]
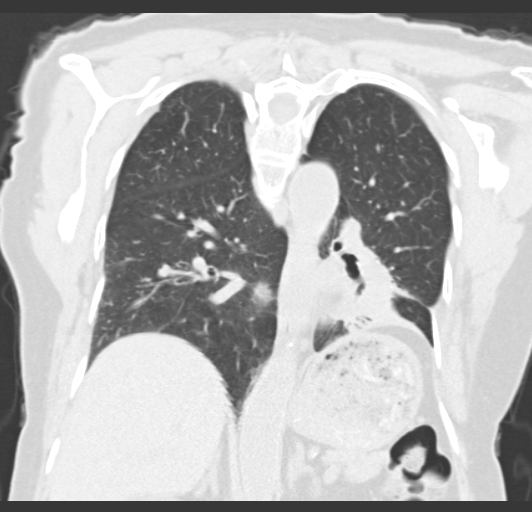

[15 of 36 positions shown; findings below may reference images not displayed]

FINDINGS: Mediastinum/Nodes: The heart is normal in size. Small pericardial
effusion.

Coronary atherosclerosis in the LAD and right circumflex.

Atherosclerotic calcifications of the aortic arch.

No suspicious mediastinal lymphadenopathy.

Status post thyroidectomy. No abnormal soft tissue in the surgical
bed.

Lungs/Pleura: Radiation changes in the left lower
paramediastinal/infrahilar region.

10 x 6 mm nodule in the medial right lower lobe (series 3/image 35,
grossly unchanged from most recent CTs, increased from 08/20/2013
and new from 1200.

Two 3-4 mm nodules in the lateral right upper lobe (series 3/ image
15).

Mild subpleural scarring in the lateral right middle lobe (series 3/
image 35).

No pleural effusion or pneumothorax.

Upper abdomen: Visualized upper abdomen is notable for vascular
calcifications.

Musculoskeletal: Visualized osseous structures are within normal
limits.
IMPRESSION: Radiation changes in the left hemithorax.

Stable 10 x 6 mm nodule in the medial right lower lobe, although
increased from August 2013 and new from 1200. Continued attention on
follow-up is suggested.

No findings specific for metastatic disease.

## 2019-02-07 NOTE — Progress Notes (Signed)
REVIEWED-NO ADDITIONAL RECOMMENDATIONS.
